# Patient Record
Sex: Male | Born: 1950 | Race: White | Hispanic: No | Marital: Married | Smoking: Former smoker
Health system: Southern US, Community
[De-identification: ages and names within clinical notes are randomized; demographics above are authoritative.]

## PROBLEM LIST (undated history)

## (undated) DIAGNOSIS — I779 Disorder of arteries and arterioles, unspecified: Secondary | ICD-10-CM

## (undated) DIAGNOSIS — Q239 Congenital malformation of aortic and mitral valves, unspecified: Secondary | ICD-10-CM

## (undated) DIAGNOSIS — I200 Unstable angina: Secondary | ICD-10-CM

## (undated) DIAGNOSIS — R001 Bradycardia, unspecified: Secondary | ICD-10-CM

## (undated) DIAGNOSIS — I219 Acute myocardial infarction, unspecified: Secondary | ICD-10-CM

## (undated) DIAGNOSIS — C61 Malignant neoplasm of prostate: Secondary | ICD-10-CM

## (undated) DIAGNOSIS — I82409 Acute embolism and thrombosis of unspecified deep veins of unspecified lower extremity: Secondary | ICD-10-CM

## (undated) DIAGNOSIS — E782 Mixed hyperlipidemia: Secondary | ICD-10-CM

## (undated) DIAGNOSIS — Z789 Other specified health status: Secondary | ICD-10-CM

## (undated) DIAGNOSIS — J45909 Unspecified asthma, uncomplicated: Secondary | ICD-10-CM

## (undated) DIAGNOSIS — I493 Ventricular premature depolarization: Secondary | ICD-10-CM

## (undated) DIAGNOSIS — I2510 Atherosclerotic heart disease of native coronary artery without angina pectoris: Secondary | ICD-10-CM

## (undated) DIAGNOSIS — I739 Peripheral vascular disease, unspecified: Secondary | ICD-10-CM

## (undated) DIAGNOSIS — I10 Essential (primary) hypertension: Secondary | ICD-10-CM

## (undated) HISTORY — PX: HERNIA REPAIR: SHX51

## (undated) HISTORY — DX: Mixed hyperlipidemia: E78.2

## (undated) HISTORY — DX: Peripheral vascular disease, unspecified: I73.9

## (undated) HISTORY — DX: Atherosclerotic heart disease of native coronary artery without angina pectoris: I25.10

## (undated) HISTORY — PX: CARDIAC CATHETERIZATION: SHX172

## (undated) HISTORY — DX: Acute myocardial infarction, unspecified: I21.9

---

## 2005-04-12 ENCOUNTER — Other Ambulatory Visit: Admission: RE | Admit: 2005-04-12 | Discharge: 2005-04-12 | Payer: Self-pay | Admitting: Family Medicine

## 2005-09-26 DIAGNOSIS — C61 Malignant neoplasm of prostate: Secondary | ICD-10-CM

## 2005-09-26 HISTORY — DX: Malignant neoplasm of prostate: C61

## 2005-09-26 HISTORY — PX: PROSTATECTOMY: SHX69

## 2006-01-11 ENCOUNTER — Observation Stay (HOSPITAL_COMMUNITY): Admission: RE | Admit: 2006-01-11 | Discharge: 2006-01-12 | Payer: Self-pay | Admitting: General Surgery

## 2006-01-11 ENCOUNTER — Encounter (INDEPENDENT_AMBULATORY_CARE_PROVIDER_SITE_OTHER): Payer: Self-pay | Admitting: *Deleted

## 2006-02-17 ENCOUNTER — Ambulatory Visit (HOSPITAL_COMMUNITY): Admission: RE | Admit: 2006-02-17 | Discharge: 2006-02-17 | Payer: Self-pay | Admitting: Orthopaedic Surgery

## 2006-04-25 ENCOUNTER — Ambulatory Visit: Admission: RE | Admit: 2006-04-25 | Discharge: 2006-05-17 | Payer: Self-pay | Admitting: Radiation Oncology

## 2006-07-05 ENCOUNTER — Encounter (INDEPENDENT_AMBULATORY_CARE_PROVIDER_SITE_OTHER): Payer: Self-pay | Admitting: *Deleted

## 2006-07-05 ENCOUNTER — Ambulatory Visit (HOSPITAL_COMMUNITY): Admission: RE | Admit: 2006-07-05 | Discharge: 2006-07-06 | Payer: Self-pay | Admitting: Urology

## 2008-05-27 ENCOUNTER — Ambulatory Visit (HOSPITAL_COMMUNITY): Admission: RE | Admit: 2008-05-27 | Discharge: 2008-05-27 | Payer: Self-pay | Admitting: Family Medicine

## 2008-07-01 ENCOUNTER — Ambulatory Visit: Payer: Self-pay | Admitting: Vascular Surgery

## 2009-01-13 ENCOUNTER — Ambulatory Visit: Payer: Self-pay | Admitting: Vascular Surgery

## 2009-07-07 ENCOUNTER — Ambulatory Visit (HOSPITAL_COMMUNITY): Admission: RE | Admit: 2009-07-07 | Discharge: 2009-07-07 | Payer: Self-pay | Admitting: Family Medicine

## 2010-01-07 ENCOUNTER — Ambulatory Visit: Payer: Self-pay | Admitting: Vascular Surgery

## 2011-01-14 ENCOUNTER — Other Ambulatory Visit (INDEPENDENT_AMBULATORY_CARE_PROVIDER_SITE_OTHER): Payer: BC Managed Care – PPO

## 2011-01-14 DIAGNOSIS — I6529 Occlusion and stenosis of unspecified carotid artery: Secondary | ICD-10-CM

## 2011-01-19 NOTE — Procedures (Unsigned)
CAROTID DUPLEX EXAM  INDICATION:  Carotid disease.  HISTORY: Diabetes:  No. Cardiac:  No. Hypertension:  Yes. Smoking:  Previous. Previous Surgery:  No. CV History:  Currently asymptomatic. Amaurosis Fugax No, Paresthesias No, Hemiparesis No                                      RIGHT             LEFT Brachial systolic pressure:         122               118 Brachial Doppler waveforms:         Normal            Normal Vertebral direction of flow:        Antegrade         Antegrade DUPLEX VELOCITIES (cm/sec) CCA peak systolic                   74                M=77/D=170 ECA peak systolic                   102               167 ICA peak systolic                   75                88 ICA end diastolic                   26                33 PLAQUE MORPHOLOGY:                  Heterogeneous     Heterogeneous PLAQUE AMOUNT:                      Mild              Moderate PLAQUE LOCATION:                    ICA               ICA / distal CCA  IMPRESSION: 1. No hemodynamically significant stenosis of the bilateral internal     carotid arteries with plaque formations as described above. 2. Increased velocity noted at the left distal common carotid artery. 3. No significant change noted when compared to the previous exam on     01/07/2010.  ___________________________________________ Di Kindle. Edilia Bo, M.D.  CH/MEDQ  D:  01/17/2011  T:  01/17/2011  Job:  [id number]

## 2011-02-08 NOTE — Consult Note (Signed)
VASCULAR SURGERY CONSULTATION   [name], [name]  DOB:  1951-03-18                                       07/01/2008  CHART#:[id number]   I saw the patient in the office today in consultation concerning a  moderate carotid disease.  Was referred by Dr. Nobie Putnam.  This is a  pleasant 60 year old gentleman who approximately 2 weeks ago began  experiencing headaches at night.  Part of his workup included an MRI of  the head which showed no acute intracranial abnormality.  Dr. Nobie Putnam  also noted a carotid bruit.  This prompted duplex scan which showed some  moderate carotid disease bilaterally.  He was sent for vascular  consultation.  Of note, his headaches have improved significantly.  He  is right handed.  He denies any history of stroke, TIAs, expressive or  receptive aphasia, or amaurosis fugax.  He does have a history of  chronic dizziness.   PAST MEDICAL HISTORY:  Significant for hypertension, prostate cancer,  and has undergone a prostatectomy in October 2007.   FAMILY HISTORY:  His father had coronary artery disease at age 79 and  underwent coronary revascularization.  His grandfather died from a heart  attack at age 36.  He is unaware of any other history of premature  cardiovascular disease.   SOCIAL HISTORY:  He is married.  He does not have any children.  He  works as a Surveyor, minerals.  He quit tobacco in 1979.   REVIEW OF SYSTEMS AND MEDICATIONS:  Documented on the medical history  form in his chart.   PHYSICAL EXAMINATION:  This is a pleasant 60 year old gentleman who  appears his stated age.  Blood pressure is 177/99.  Heart rate is 66.  Neck is supple.  There is no cervical lymphadenopathy.  He has a soft  left carotid bruit.  Lungs are clear bilaterally to auscultation.  Cardiac exam, he has a regular rate and rhythm.  Abdomen:  Soft and  nontender.  I cannot palpate an aneurysm.  He has normal-pitched bowel  sounds.  I do not appreciate any  abdominal bruits.  He has palpable  femoral, popliteal, and pedal pulses bilaterally.  He has no significant  lower extremity swelling.  Neurologic:  Exam is nonfocal.   I did review his duplex scan, which was performed at [facility]  on 05/27/2008.  This shows by velocity criteria a less than 39% right  carotid.  Both vertebral arteries were patent with a 59% left carotid  stenosis.  He has normally directed flow.   I have explained that we generally would not consider carotid  endarterectomy for an asymptomatic patient unless the stenosis  progressed to greater than 80%.  Recommended followup duplex scan in 6  months.  If this is stable, I think he could have followup studies on a  yearly basis.  He apparently had some problems with his insurance  company having a duplex done at [facility], and we would be more than  happy to have this done here, which is what he would prefer unless his  insurance company will cover the studies at [facility].  I will  only plan on seeing him if there is a significant change in his duplex  scan or he develops new neurologic symptoms which we have reviewed the  office today.  He does not continue.   Di Kindle. Edilia Bo, M.D.  Electronically Signed  CSD/MEDQ  D:  07/01/2008  T:  07/02/2008  Job:  [id number]   cc:   Patrica Duel, M.D.

## 2011-02-08 NOTE — Procedures (Signed)
CAROTID DUPLEX EXAM   INDICATION:  Carotid disease.   HISTORY:  Diabetes:  No.  Cardiac:  No.  Hypertension:  Yes.  Smoking:  Previous.  Previous Surgery:  No.  CV History:  Currently asymptomatic.  Amaurosis Fugax No, Paresthesias No, Hemiparesis No.                                       RIGHT             LEFT  Brachial systolic pressure:         128               128  Brachial Doppler waveforms:         Normal            Normal  Vertebral direction of flow:        Antegrade         Antegrade  DUPLEX VELOCITIES (cm/sec)  CCA peak systolic                   82                M = 93/D = 173  ECA peak systolic                   98                167  ICA peak systolic                   91                98  ICA end diastolic                   28                33  PLAQUE MORPHOLOGY:                  Mixed             Mixed  PLAQUE AMOUNT:                      Mild              Mild  PLAQUE LOCATION:                    ICA               ICA/ECA/CCA   IMPRESSION:  1. 1% to 39% stenosis of the bilateral internal carotid arteries.  2. Elevated velocity of 173 cm/s noted near the bifurcation level of      the left common carotid artery.  3. No significant change noted when compared to the previous      examination of 01/13/2009.   ___________________________________________  Di Kindle. Edilia Bo, M.D.   CH/MEDQ  D:  01/07/2010  T:  01/07/2010  Job:  [id number]

## 2011-02-08 NOTE — Procedures (Signed)
CAROTID DUPLEX EXAM   INDICATION:  Carotid disease.   HISTORY:  Diabetes:  No.  Cardiac:  No.  Hypertension:  Yes.  Smoking:  Previous.  Previous Surgery:  No.  CV History:  Asymptomatic.  Amaurosis Fugax No, Paresthesias No, Hemiparesis No.                                       RIGHT             LEFT  Brachial systolic pressure:         130               130  Brachial Doppler waveforms:         Normal            Normal  Vertebral direction of flow:        Antegrade         Antegrade  DUPLEX VELOCITIES (cm/sec)  CCA peak systolic                   98                192/52 (D)  ECA peak systolic                   100               170  ICA peak systolic                   85                92  ICA end diastolic                   26                22  PLAQUE MORPHOLOGY:                  Mixed             Mixed  PLAQUE AMOUNT:                      Mild              Mild/Moderate  PLAQUE LOCATION:                    ICA/ECA           ICA/ECA/Distal CCA   IMPRESSION:  1. 1-39% stenosis of the bilateral internal carotid arteries.  2. Significant stenosis of the left distal common carotid      artery/bifurcation area noted.   ___________________________________________  Di Kindle. Edilia Bo, M.D.   CH/MEDQ  D:  01/13/2009  T:  01/13/2009  Job:  [id number]

## 2011-02-11 NOTE — Op Note (Signed)
NAMEGWENDOLYN, [name] NO.:  [id number]   MEDICAL RECORD NO.:  [id number]          PATIENT TYPE:  AMB   LOCATION:  DAY                           FACILITY:  [facility]   PHYSICIAN:  Barbaraann Barthel, M.D. DATE OF BIRTH:  [birth year]   DATE OF PROCEDURE:  01/11/2006  DATE OF DISCHARGE:                                 OPERATIVE REPORT   SURGEON:  Dr. Malvin Johns.   PREOPERATIVE DIAGNOSIS:  Right inguinal hernia.   POSTOPERATIVE DIAGNOSIS:  Right inguinal hernia (direct).   PROCEDURE:  Right inguinal herniorrhaphy (modified McVay repair).   SPECIMEN:  Lipomatous properitoneal fat.   NOTE:  This is a 60 year old white male who presented with a nonincarcerated  right inguinal hernia.  We discussed elective repair with him in detail.  We  discussed complications not limited to but including bleeding, infection and  recurrence.  Informed consent was obtained.   GROSS OPERATIVE FINDINGS:  Those consistent with a direct hernia defect. He  had some sort of attenuated tissues but sufficient enough to repair without  the use of mesh prosthesis.   TECHNIQUE:  The patient was placed in supine position. After the adequate  administration of general LMA anesthesia, he was prepped with Betadine  solution and draped in the usual manner.  Prior to this, a Foley catheter  was aseptically inserted.  An incision was carried out between the anterior-  superior iliac spine and pubic tubercle through skin and subcutaneous  tissue, down through Scarpa's layer, down through the external oblique which  was opened through the external ring.  The ilioinguinal nerve was preserved  from dissection.  The cord structures were then dissected free.  There was  no indirect hernia sac.  There was rather prominent properitoneal lipomatous  fat which was dissected free from the cord structures, ligated with 2-0 silk  and amputated.  We then closed the inguinal canal with suturing transversus  abdominis and  transversalis fascia to Cooper's ligament and Poupart's  ligament 2-0 Nurolon sutures.  Prior to cinching these tightly, a relaxing  incision was carried out.  We then used 1/2% Sensorcaine to anesthetize the  area for postoperative comfort.  We returned the cord structures and the  ilioinguinal nerve to their anatomic position and closed the external  oblique over this with a running 3-0 Polysorb suture.  The subcu was  irrigated with normal saline solution.  Skin was approximated using a  stapling device.  Prior to closure,  all sponge, needle and instrument counts were found to be correct.  Estimated blood loss was minimal.  The patient received 1100 cc crystalloids  intraoperatively.  No drains were placed, and the patient was taken to  recovery room in satisfactory condition.      Barbaraann Barthel, M.D.  Electronically Signed     WB/MEDQ  D:  01/11/2006  T:  01/12/2006  Job:  [id number]   cc:   Patrica Duel, M.D.  Fax: [phone]

## 2011-02-11 NOTE — Op Note (Signed)
NAMEJARRED, Kyle NO.:  [id number]   MEDICAL RECORD NO.:  [id number]          PATIENT TYPE:  OIB   LOCATION:  1401                         FACILITY:  [facility]   PHYSICIAN:  Valetta Fuller, M.D.  DATE OF BIRTH:  22-Aug-1951   DATE OF PROCEDURE:  DATE OF DISCHARGE:  07/06/2006                                 OPERATIVE REPORT   PREOPERATIVE DIAGNOSIS:  Clinical stage T1c adenocarcinoma prostate.   POSTOPERATIVE DIAGNOSIS:  Clinical stage T1c adenocarcinoma prostate.   PROCEDURE PERFORMED:  Robotic assisted laparoscopic radical retropubic  prostatectomy with bilateral nerve sparing and bilateral pelvic lymph node  dissection.   SURGEON:  Valetta Fuller, M.D.   ASSISTANT:  Heloise Purpura, MD   ANESTHESIA:  General endotracheal.   INDICATIONS:  [name] is a 60 year old male.  He was sent to see me  because of borderline PSA elevation of 4.3.  The patient had also had an MRI  of his pelvis to evaluate some musculoskeletal abnormalities and they  thought there was an area of a prostatic nodule on the left side, but this  area was not palpable.  The patient underwent ultrasound which showed a  moderately enlarged prostate of about 40 grams.  Right-sided biopsies were  negative; on the left he had relatively low volume Gleason 3+4 equals 7  tumor involving 30% of the biopsy material.  The patient underwent extensive  consultation with regard to treatment options.  He had sought a second  opinion as well.  The patient was felt to be a reasonably good candidate for  bilateral nerve sparing robotic prostatectomy.  He elected to proceed that  treatment option.  He was told that he would have approximately a 75% chance  of preserving erectile functioning with the use of oral agents.  We talked  about an 85-90% chance of urinary continence; and a 10-15% chance of some  degree of stress urinary incontinence.  The patient appeared to understand  all the potential  complications and issues with regard to this type surgery;  and full informed consent has been obtained.  The patient has done a  mechanical bowel prep and has received preoperative antibiotics and has  compression stockings.   TECHNIQUE AND FINDINGS:  The patient was brought to the operating room where  he had successful induction of general anesthesia.  He was placed in a  dorsal lithotomy position and secured to the table.  Foley catheter was  inserted sterilely.  Initial site was chosen 18 cm FROM the pubic symphysis  just left the umbilicus.  An open standard Hasson technique was utilized.  The 12-mm port was placed, again, through the open technique; and the  abdomen was insufflated.  The abdomen was carefully inspected.  There was no  evidence of any inner abdominal pathology or significant adhesions.  All the  remaining ports were placed with direct visual guidance.  We placed three  robotic 8-mm ports.  There was also a 12-mm standard laparoscopic assist  port, and a 5-mm port placed for suction.   Attention was then initially turned towards  taking down the bladder.  The  bladder was filled, and then utilizing hot scissors, the bladder was  reflected posteriorly allowing entry into the space of Retzius and  identification of the prostate and endopelvic fascia.  The endopelvic fascia  was incised from the apex back to the base of the prostate bilaterally; and  the underlying levator fibers were swept laterally off the prostate.  The  __________ ligaments were identified and taken down.  An ETS stapler device  was then used to staple the dorsal vein complex down to the urethra.  By  defatting the bladder neck and utilizing the Foley catheter.  The bladder  neck area was easily identified; and the anterior bladder neck was then  divided with electrocautery/hot scissors.  The Foley catheter was  identified; and then the balloon deflated.  The Foley catheter was brought  into the  operative field, and used to retract the prostate anteriorly.  The  posterior bladder neck was then divided.  No evidence of middle lobe was  noted.  The posterior bladder neck dissection was continued until the vas  deferens and seminal vesicles were identified.  These were each dissected  out in their entirety.  Clips were used for the tips of the seminal vesicles  bilaterally.  Space between the fascia and the anterior rectum were then  identified and developed and those planes were felt to be quite normal.   Attention was then turned towards bilateral nerve spare.  The endopelvic  fascia at the lateral junction of the prostate was identified.  The  neurovascular bundles were identified bilaterally and we were able to easily  establish a plane between the prostatic fascia and the neurovascular bundles  which were then swept both distally and proximally back towards the bladder  neck.  With the posterior plane well established and the neurovascular  bundle dissection started we were able to easily identify the areas of the  pedicles of the prostate.  These were taken with Hem-o-lok clips and then  sharply divided.  As the pedicles were taken through we were able to  identify the path of the neurovascular bundles bilaterally, and bilateral  nerve spare was accomplished.  There was no evidence of any injury or  disruption of the prostatic capsule on either side.  Once the prostate was  completely freed, the urethra was divided sharply utilizing cold scissors.  The prostate specimen was then placed up in the abdomen; and the pelvis was  copiously irrigated.  One could see two very nice neurovascular bundles on  both sides.  With irrigation in the pelvis air was injected into the rectal  catheter; and there was no evidence of rectal injury.   Attention was then turned towards bilateral pelvic lymph nodes.  The  external iliac veins were found on both sides.  advential tissue was divided.   The fibrofatty tissue between the externally iliac vein, Cooper's  ligament, obturator nerve, and the confluence of the iliac vessels was  dissected free.  Hem-o-lok clips were used; and both packets were sent  separately for pathologic evaluation.   Attention was then turned towards reconstruction.  The urethral anastomosis  was done initially by placing a 2-0 Vicryl suture between the bladder neck  and the urethra at the 6 o'clock position to reapproximate the posterior  bladder neck and urethra.  A double-armed 2-0 Monocryl suture was then used  to perform a running 360-degree anastomosis between the urethra and the  bladder neck.  A new 18-French Coude catheter was inserted; and the bladder  was irrigated, and found to be watertight.  The surgical cart was then  undocked.  The prostate was placed in an Endopouch and removed through the  camera port site.  A #0 Vicryl suture was placed through the abdominal wall  fascia to close the 12-mm port.  A 19-French Blake drain was also placed  through one of the ports and positioned in the pelvis and secured with a  nylon suture.  All ports were removed with direct vision.  There is no  evidence of any active bleeding.  The camera port incision was closed with  #0 Vicryl suture.  Skin was closed with clips.  The patient was brought to  recovery room in stable condition.  Blood loss was minimal.           ______________________________  Valetta Fuller, M.D.  Electronically Signed     DSG/MEDQ  D:  07/05/2006  T:  07/07/2006  Job:  [id number]   cc:   Patrica Duel, M.D.  Fax: [phone]

## 2011-05-21 ENCOUNTER — Other Ambulatory Visit: Payer: Self-pay

## 2011-05-21 ENCOUNTER — Emergency Department (HOSPITAL_COMMUNITY)
Admission: EM | Admit: 2011-05-21 | Discharge: 2011-05-21 | Disposition: A | Discharge status: Other Acute Inpatient Hospital | Payer: BC Managed Care – PPO | Source: Home / Self Care | Attending: Emergency Medicine | Admitting: Emergency Medicine

## 2011-05-21 ENCOUNTER — Emergency Department (HOSPITAL_COMMUNITY): Payer: BC Managed Care – PPO

## 2011-05-21 ENCOUNTER — Encounter: Payer: Self-pay | Admitting: Emergency Medicine

## 2011-05-21 ENCOUNTER — Inpatient Hospital Stay (HOSPITAL_COMMUNITY)
Admission: EM | Admit: 2011-05-21 | Discharge: 2011-05-25 | DRG: 853 | Disposition: A | Discharge status: Home / Self Care | Payer: BC Managed Care – PPO | Source: Ambulatory Visit | Attending: Internal Medicine | Admitting: Internal Medicine

## 2011-05-21 DIAGNOSIS — I219 Acute myocardial infarction, unspecified: Secondary | ICD-10-CM

## 2011-05-21 DIAGNOSIS — I2109 ST elevation (STEMI) myocardial infarction involving other coronary artery of anterior wall: Principal | ICD-10-CM | POA: Diagnosis present

## 2011-05-21 DIAGNOSIS — E785 Hyperlipidemia, unspecified: Secondary | ICD-10-CM | POA: Diagnosis present

## 2011-05-21 DIAGNOSIS — Z8546 Personal history of malignant neoplasm of prostate: Secondary | ICD-10-CM | POA: Insufficient documentation

## 2011-05-21 DIAGNOSIS — I10 Essential (primary) hypertension: Secondary | ICD-10-CM | POA: Diagnosis present

## 2011-05-21 DIAGNOSIS — I2510 Atherosclerotic heart disease of native coronary artery without angina pectoris: Secondary | ICD-10-CM

## 2011-05-21 DIAGNOSIS — I213 ST elevation (STEMI) myocardial infarction of unspecified site: Secondary | ICD-10-CM

## 2011-05-21 DIAGNOSIS — I2589 Other forms of chronic ischemic heart disease: Secondary | ICD-10-CM | POA: Diagnosis present

## 2011-05-21 HISTORY — DX: Malignant neoplasm of prostate: C61

## 2011-05-21 HISTORY — DX: Essential (primary) hypertension: I10

## 2011-05-21 HISTORY — DX: Peripheral vascular disease, unspecified: I73.9

## 2011-05-21 HISTORY — DX: Disorder of arteries and arterioles, unspecified: I77.9

## 2011-05-21 HISTORY — DX: Acute myocardial infarction, unspecified: I21.9

## 2011-05-21 LAB — CBC
HCT: 47.4 % (ref 39.0–52.0)
Hemoglobin: 16.4 g/dL (ref 13.0–17.0)
MCH: 30.8 pg (ref 26.0–34.0)
MCHC: 34.6 g/dL (ref 30.0–36.0)
MCV: 89.1 fL (ref 78.0–100.0)
Platelets: 225 K/uL (ref 150–400)
RBC: 5.32 MIL/uL (ref 4.22–5.81)
RDW: 12.8 % (ref 11.5–15.5)
WBC: 13.7 10*3/uL — ABNORMAL HIGH (ref 4.0–10.5)

## 2011-05-21 LAB — POCT I-STAT TROPONIN I: Troponin i, poc: 0.01 ng/mL (ref 0.00–0.08)

## 2011-05-21 LAB — URINALYSIS, ROUTINE W REFLEX MICROSCOPIC
Glucose, UA: NEGATIVE mg/dL
Leukocytes, UA: NEGATIVE
Nitrite: NEGATIVE
Specific Gravity, Urine: 1.046 — ABNORMAL HIGH (ref 1.005–1.030)
pH: 7 (ref 5.0–8.0)

## 2011-05-21 LAB — CARDIAC PANEL(CRET KIN+CKTOT+MB+TROPI)
CK, MB: 3.8 ng/mL (ref 0.3–4.0)
Relative Index: 1.8 (ref 0.0–2.5)
Relative Index: 8.5 — ABNORMAL HIGH (ref 0.0–2.5)
Total CK: 211 U/L (ref 7–232)
Total CK: 1055 U/L — ABNORMAL HIGH (ref 7–232)
Troponin I: <0.3 ng/mL (ref <0.30)

## 2011-05-21 LAB — COMPREHENSIVE METABOLIC PANEL
ALT: 31 U/L (ref 0–53)
AST: 84 U/L — ABNORMAL HIGH (ref 0–37)
CO2: 29 mEq/L (ref 19–32)
Calcium: 9 mg/dL (ref 8.4–10.5)
Chloride: 104 mEq/L (ref 96–112)
Creatinine, Ser: 0.77 mg/dL (ref 0.50–1.35)
GFR calc Af Amer: >60 mL/min (ref >60)
GFR calc non Af Amer: >60 mL/min (ref >60)
Glucose, Bld: 97 mg/dL (ref 70–99)
Total Bilirubin: 0.4 mg/dL (ref 0.3–1.2)

## 2011-05-21 LAB — MRSA PCR SCREENING: MRSA by PCR: NEGATIVE

## 2011-05-21 LAB — PROTIME-INR
INR: 0.86 (ref 0.00–1.49)
Prothrombin Time: 11.9 s (ref 11.6–15.2)

## 2011-05-21 LAB — APTT: aPTT: 21 seconds — ABNORMAL LOW (ref 24–37)

## 2011-05-21 MED ORDER — HEPARIN BOLUS VIA INFUSION
4000.0000 [IU] | Freq: Once | INTRAVENOUS | Status: AC
  Administered 2011-05-21: 4000 [IU] via INTRAVENOUS

## 2011-05-21 MED ORDER — FENTANYL CITRATE 0.05 MG/ML IJ SOLN
INTRAMUSCULAR | Status: AC
  Administered 2011-05-21: 100 ug via INTRAVENOUS
  Filled 2011-05-21: qty 2

## 2011-05-21 MED ORDER — ONDANSETRON HCL 4 MG/2ML IJ SOLN
INTRAMUSCULAR | Status: AC
  Administered 2011-05-21: 4 mg via INTRAVENOUS
  Filled 2011-05-21: qty 2

## 2011-05-21 MED ORDER — ONDANSETRON HCL 4 MG/2ML IJ SOLN
4.0000 mg | Freq: Once | INTRAMUSCULAR | Status: AC
  Administered 2011-05-21: 4 mg via INTRAVENOUS

## 2011-05-21 MED ORDER — ONDANSETRON HCL 4 MG/2ML IJ SOLN: 4.0000 mg | Freq: Once | INTRAMUSCULAR | Status: DC

## 2011-05-21 MED ORDER — HEPARIN (PORCINE) IN NACL 100-0.45 UNIT/ML-% IJ SOLN
1000.0000 [IU]/h | Freq: Once | INTRAMUSCULAR | Status: AC
  Administered 2011-05-21: 1000 [IU]/h via INTRAVENOUS

## 2011-05-21 MED ORDER — NITROGLYCERIN IN D5W 200-5 MCG/ML-% IV SOLN
10.0000 ug/min | Freq: Once | INTRAVENOUS | Status: AC
  Administered 2011-05-21: 10 ug/min via INTRAVENOUS

## 2011-05-21 MED ORDER — HEPARIN (PORCINE) IN NACL 100-0.45 UNIT/ML-% IJ SOLN
INTRAMUSCULAR | Status: AC
  Administered 2011-05-21: 1000 [IU]/h via INTRAVENOUS
  Filled 2011-05-21: qty 250

## 2011-05-21 MED ORDER — FENTANYL CITRATE 0.05 MG/ML IJ SOLN
100.0000 ug | Freq: Once | INTRAMUSCULAR | Status: AC
  Administered 2011-05-21: 100 ug via INTRAVENOUS

## 2011-05-21 MED ORDER — NITROGLYCERIN IN D5W 200-5 MCG/ML-% IV SOLN
INTRAVENOUS | Status: AC
  Administered 2011-05-21: 10 ug/min via INTRAVENOUS
  Filled 2011-05-21: qty 250

## 2011-05-21 NOTE — ED Provider Notes (Addendum)
History     CSN: [id number] Arrival date & time: 05/21/2011  5:02 PM  Chief Complaint  Patient presents with  . Chest Pain   HPI Comments: The patient presents by private vehicle to 2 severe onset of mid substernal chest pain radiating straight through to his back approximately 30 minutes prior to arrival. Patient was seen immediately in the emergency department. His initial EKG shows ST elevation in the anterior and lateral leads consistent with ST elevation MI. Patient has severe nausea but has not vomited. Patient reports that he does have a cardiologist with [facility]. He reports he did take 2 aspirin prior to arrival to the emergency department. Denies feeling lightheaded or dizzy. He denies any abdominal pain. No recent fevers coughing or or cold symptoms.  Patient is a 60 y.o. male presenting with chest pain. The history is provided by the patient. The history is limited by the condition of the patient.  Chest Pain     Past Medical History  Diagnosis Date  . Hypertension   . Prostate cancer     Past Surgical History  Procedure Date  . Prostatectomy   . Hernia repair     Family History  Problem Relation Age of Onset  . Cancer Mother   . Heart failure Father     History  Substance Use Topics  . Smoking status: Never Smoker   . Smokeless tobacco: Never Used  . Alcohol Use: No      Review of Systems  Unable to perform ROS: Unstable vital signs  Cardiovascular: Positive for chest pain.    Physical Exam  BP 148/111  Pulse 91  Resp 28  Ht 5\' 8"  (1.727 m)  Wt 175 lb (79.379 kg)  BMI 26.61 kg/m2  SpO2 100%  Physical Exam  Constitutional: He appears well-developed and well-nourished. He appears distressed.  HENT:  Head: Normocephalic.  Eyes: Pupils are equal, round, and reactive to light.  Neck: Neck supple.  Cardiovascular: Normal rate, regular rhythm and normal heart sounds.   Pulmonary/Chest: Tachypnea noted. He is in respiratory  distress. He has no decreased breath sounds. He has no wheezes. He has no rhonchi.  Abdominal: There is no tenderness. There is no rebound.  Musculoskeletal:       No calf swelling or tenderness  Neurological: He is alert. He has normal strength.       No facial droop, moving 4 extremities equally  Skin: Skin is warm. He is diaphoretic.       Mild diaphoresis  Psychiatric: His mood appears anxious.    ED Course  Procedures   Medical decision making: Patient's EKG times at 1704 shows a rate of 89 sinus rhythm with some mild artifact due to the patient's condition and mild agitation. There is evidence of ST elevation in leads V2 through V5 as well as an probably one and aVL. There is normal axis. This is consistent with anterolateral ST elevation MI. Currently the patient is hypertensive protecting his airway. Patient did report taking aspirin prior to arrival to the emergency department. We'll start him on nitroglycerin drip due to his hypertension as well as pain. This can be titrated according to his blood pressure. Patient is also given IV Zofran and fentanyl here immediately for pain. [facility] EMS happens to be severe and emergency department and care will be transferred to them in order to transport emergently to [facility]. I spoke to Dr. Sanjuana Kava, [facility] cardiology.  He accepts the patient to  [facility] emergency department as he currently has a person in the cast room at present. Critical care time of 30 minutes every 2 direct patient care at bedside, serial exams, airway management, pain management, discussion with another physician, documentation    2IV's were placed.  BP remains high in the 150-160 range.  Informed charge nurse at [facility] ED.  EMS told doses of morphine 2 mg IVP could be given provided breathing and BP were appropriate.    5:46 PM PCXR results reviewed.  O2 sats of 100% is normal.  Gavin Pound. Oletta Lamas, MD 05/21/11 1729  Gavin Pound. Oletta Lamas, MD 05/21/11  1739  Gavin Pound. Carlis Blanchard, MD 05/21/11 1746

## 2011-05-21 NOTE — ED Notes (Signed)
Patient's chart faxed to [phone] at [facility] ED at this time.

## 2011-05-21 NOTE — ED Notes (Addendum)
Patient c/o chest pain with shortness of breath. Patient started having severe chest pain while walking in [employer]. Patient very diaphoretic, Short of breath. EDP in room assessing patient.

## 2011-05-21 NOTE — ED Notes (Signed)
Patient left ED via [facility] with [facility]. Patient alert/oriented upon transfer. [facility] called and report given to Christy,RN.

## 2011-05-21 NOTE — ED Notes (Addendum)
Patient c/o substernal chest pain starting 30 minutes ago. Describes pain as sharp/stabbing that radiates to back. Also c/o left arm numbness. Patient diaphoretic, anxious, and dry heaving at present time. Patient with labor breathing and in obvious distress.   Dr. Oletta Lamas at bedside  EKG completed upon arrival to room and shown to Dr. Oletta Lamas.

## 2011-05-22 LAB — CK TOTAL AND CKMB (NOT AT ARMC): CK, MB: 71.8 ng/mL (ref 0.3–4.0)

## 2011-05-22 LAB — BASIC METABOLIC PANEL
CO2: 30 mEq/L (ref 19–32)
Calcium: 9 mg/dL (ref 8.4–10.5)
Creatinine, Ser: 0.81 mg/dL (ref 0.50–1.35)
Glucose, Bld: 103 mg/dL — ABNORMAL HIGH (ref 70–99)

## 2011-05-22 LAB — CARDIAC PANEL(CRET KIN+CKTOT+MB+TROPI)
CK, MB: 103.4 ng/mL (ref 0.3–4.0)
CK, MB: 77.8 ng/mL (ref 0.3–4.0)
Total CK: 821 U/L — ABNORMAL HIGH (ref 7–232)

## 2011-05-22 LAB — LIPID PANEL
Cholesterol: 149 mg/dL (ref 0–200)
HDL: 40 mg/dL (ref >39)
Triglycerides: 90 mg/dL (ref <150)

## 2011-05-22 LAB — CBC
Platelets: 193 10*3/uL (ref 150–400)
RDW: 12.7 % (ref 11.5–15.5)
WBC: 11.1 10*3/uL — ABNORMAL HIGH (ref 4.0–10.5)

## 2011-05-22 LAB — HEMOGLOBIN A1C
Hgb A1c MFr Bld: 5.7 % — ABNORMAL HIGH (ref <5.7)
Mean Plasma Glucose: 117 mg/dL — ABNORMAL HIGH (ref <117)

## 2011-05-23 LAB — CBC
MCV: 88.4 fL (ref 78.0–100.0)
Platelets: 180 10*3/uL (ref 150–400)
RBC: 4.73 MIL/uL (ref 4.22–5.81)
WBC: 13.7 10*3/uL — ABNORMAL HIGH (ref 4.0–10.5)

## 2011-05-23 LAB — CK TOTAL AND CKMB (NOT AT ARMC)
CK, MB: 55.7 ng/mL (ref 0.3–4.0)
Relative Index: 7.6 — ABNORMAL HIGH (ref 0.0–2.5)
Total CK: 737 U/L — ABNORMAL HIGH (ref 7–232)

## 2011-05-23 LAB — BASIC METABOLIC PANEL
Calcium: 9 mg/dL (ref 8.4–10.5)
Chloride: 103 mEq/L (ref 96–112)
Creatinine, Ser: 0.75 mg/dL (ref 0.50–1.35)
GFR calc Af Amer: >60 mL/min (ref >60)
GFR calc non Af Amer: >60 mL/min (ref >60)

## 2011-05-23 LAB — TROPONIN I: Troponin I: >25 ng/mL (ref <0.30)

## 2011-05-23 LAB — POCT ACTIVATED CLOTTING TIME: Activated Clotting Time: 111 seconds

## 2011-05-24 LAB — BASIC METABOLIC PANEL
Calcium: 8.9 mg/dL (ref 8.4–10.5)
GFR calc non Af Amer: >60 mL/min (ref >60)
Sodium: 139 mEq/L (ref 135–145)

## 2011-05-24 LAB — CBC
MCH: 30.6 pg (ref 26.0–34.0)
MCHC: 34.3 g/dL (ref 30.0–36.0)
Platelets: 165 10*3/uL (ref 150–400)
RDW: 12.9 % (ref 11.5–15.5)

## 2011-05-24 NOTE — Cardiovascular Report (Signed)
[name], [name] NO.:  [id number]  MEDICAL RECORD NO.:  [id number]  LOCATION:  2926                         FACILITY:  [facility]  PHYSICIAN:  Verne Carrow, MDDATE OF BIRTH:  Aug 31, 1951  DATE OF PROCEDURE:  05/21/2011 DATE OF DISCHARGE:                           CARDIAC CATHETERIZATION   PRIMARY CARDIOLOGIST:  Thurmon Fair, MD  PROCEDURES PERFORMED: 1. Left heart catheterization. 2. Selective coronary angiography. 3. Left ventricular angiogram. 4. Percutaneous transluminal coronary angioplasty with placement of a     drug-eluting stent in proximal left anterior descending artery. 5. Placement of an Angio-Seal femoral artery closure device in the     right femoral artery.  OPERATOR:  Verne Carrow, MD  INDICATIONS:  This is a 60 year old Caucasian male with a history of hypertension and a strong family history of coronary artery disease who presented via [facility] as a code STEMI.  The patient apparently began having chest pain that was severe earlier this afternoon.  He has been having on and off chest pain for 1 month.  DETAILS OF PROCEDURE:  The patient was brought emergently to the Cardiac Catheterization Laboratory via [facility].  Emergency consent was obtained.  The patient was rapidly moved from the stretcher onto the cath table.  The right groin was prepped and draped in a sterile fashion.  1% lidocaine was used for local anesthesia.  A 6-French sheath was inserted into the right femoral artery without difficulty.  A JR-4 catheter was used to perform selective angiography of the right coronary artery.  An XB LAD 3.5 guiding catheter was used to selectively engage the left main artery.  Selective angiography was performed.  We then performed intervention of the severe stenosis in the proximal left anterior descending artery.  The patient was given a bolus of Angiomax and a drip was started.  The patient was loaded  with 60 mg of Effient by mouth.  A Cougar intracoronary wire was passed down the length of the left anterior descending artery once the ACT was greater than 200.  A 2.5 x 15 mm balloon was used to pre-dilate the lesion.  A 3.0 x 24 mm Promus Element drug-eluting stent was carefully positioned in the proximal vessel and deployed without difficulty.  A 3.5 x 20 mm noncompliant balloon was carefully positioned in the stented segment and deployed without difficulty.  The stenosis was taken from 99% to 0%. There were no immediate complications.  There was excellent flow into the distal vessel.  The patient was taken to the recovery area in stable condition.  HEMODYNAMIC FINDINGS:  Central aortic pressure 109/68.  Left ventricular pressure 105/0/15.  ANGIOGRAPHIC FINDINGS: 1. The left main artery had no disease. 2. The left anterior descending was a large vessel that coursed to the     apex and gave off 5 small-caliber diagonal branches.  Proximal     vessel has a 99% subtotal occlusion.  The midvessel had two areas     of tubular 40% stenosis. 3. The circumflex artery had mild plaque disease. 4. The right coronary artery was a large dominant vessel with mild     plaque disease in the proximal mid vessel.  5. Left ventricular angiogram was performed in the RAO projection and     showed severe left ventricular systolic dysfunction with ejection     fraction of 30%.  The anterior wall had severe hypokinesis.  IMPRESSION: 1. Anterior ST-elevation myocardial infarction. 2. Subtotal occlusion of the proximal left anterior descending artery. 3. Moderate left ventricular systolic dysfunction. 4. Successful percutaneous transluminal coronary angioplasty with     placement of a drug-eluting stent in the proximal left anterior     descending x1.  RECOMMENDATIONS:  The patient should be continued on aspirin and Effient for at least 1 year.  He will also be continued on a beta-blocker  and statin.     Verne Carrow, MD     CM/MEDQ  D:  05/21/2011  T:  05/22/2011  Job:  [id number]  cc:   Thurmon Fair, MD  Electronically Signed by Verne Carrow MD on 05/24/2011 03:16:57 PM

## 2011-06-02 NOTE — Discharge Summary (Signed)
NAMECOYLE, STORDAHL NO.:  [id number]  MEDICAL RECORD NO.:  [id number]  LOCATION:  3711                         FACILITY:  [facility]  PHYSICIAN:  [place] Aedon Deason, MD         DATE OF BIRTH:  25-Jul-1951  DATE OF ADMISSION:  05/21/2011 DATE OF DISCHARGE:  05/25/2011                              DISCHARGE SUMMARY   DISCHARGE DIAGNOSES: 1. ST-elevation myocardial infarction with 99% stenosis of the left     anterior descending artery, undergoing emergent catheterization     with rescue percutaneous transluminal coronary angioplasty and     stent with Promus drug-eluting stent to the left anterior     descending artery. 2. Ischemic cardiomyopathy, ejection fraction 30% with cardiac     catheterization 2 days later and 2-D echo, ejection fraction 35-     40%. 3. Dyslipidemia. 4. Borderline blood pressure with medications. 5. Recurrent chest pain 2 days after original myocardial infarction     with recatheterization and stable stent to the left anterior     descending artery, residual 50% stenosis to the left anterior     descending artery and 40% to the right coronary artery.  DISCHARGE CONDITION:  Improved.  PROCEDURES:  On May 21, 2011, emergent cardiac catheterization for ST- elevation MI by Dr. Verne Carrow, undergoing PTCA and stent deployment to the LAD 99% stenosis to 0%, drug-eluting Promus stent was placed. On May 23, 2011, combined left heart cath with patent stent to the LAD by Dr. Nanetta Batty.  HOSPITAL COURSE:  A 60 year old white male was in [employer] on May 21, 2011 and developed chest pain, took aspirin and went to the emergency room at [facility]  EKG at 1704, ST-elevation MI anterior wall.  The patient received IV fentanyl, 6 mg morphine, nitroglycerin and IV heparin at [facility] and then was transported emergently to [facility].  He had taken his aspirin before arrived to the ER.  He had a history of chest pain off and  on for 1 month prior to this.  PAST MEDICAL HISTORY:  Hypertension with negative nuclear study in 2010. A 2-D echo revealed anterior mitral leaflet prolapse.  He also has a history of prostate cancer with prostatectomy in October 2007, and history of right inguinal hernia repair and mild carotid obstruction.  ALLERGIES:  No known allergies.  The patient was admitted after this stent was placed, he was put on Fastrac and placed in the Step-Down Unit of 2900.  He did fairly well initially.  On May 22, 2011, he had episode of sharp chest pain.  EKG was obtained.  No change.  His Zocor was changed to Lipitor and he had no acute EKG changes.  Later on May 22, 2011, he continue with chest pain.  Morphine was given.  By May 23, 2011, he still had chest pain that was relieved with morphine and nitro. Followup enzymes were done and followup EKG.  Dr. Allyson Sabal felt he should have repeat cardiac catheterization.  He went back to the Cath Lab and results as previously stated.  He continued to stabilize.  By May 24, 2011, he was transferred to telemetry  bed, ambulating with cardiac rehab and by May 25, 2011, he was stable, ambulating without complications and ready for discharge home.  He will follow up with Dr. Allyson Sabal as instructed.  DISCHARGE INSTRUCTIONS: 1. Increase activity slowly.  May shower.  No lifting for 2 weeks.  No     driving for 1 week.  No sexual activity for 2 weeks.  No work until     cleared by Dr. Allyson Sabal. 2. Low-sodium heart-healthy diet. 3. Wash cath site with soap and water.  Call us if any bleeding,     swelling or drainage. 4. Follow up with Dr. Allyson Sabal on June 03, 2011 at 11:15.  DISCHARGE MEDICATIONS:  Please see medication reconciliation sheet.  He was placed on ACE inhibitor, beta-blocker.  He is on Lipitor for now for cholesterol as well as Effient.  The patient will follow up as instructed.  LABORATORY DATA:  Hemoglobin prior to discharge 13.3,  hematocrit 38.8, WBC 9.9, platelets 165.  Protime 11.9, INR 0.86, PTT 21, sodium 139, potassium 3.8, chloride 105, CO2 24, glucose 94, BUN 17, creatinine 0.70.  LFTs were normal except for SGOT was 84, SGPT was 31.  Hemoglobin A1c was 5.7.  Cardiac enzymes; initial CK was 211 with an MB of 3.8 and troponin-I less than 0.30.  Now also n arrival at [facility], follow up at 8 p.m. on May 21, 2011; CK 1055, MB 90, and troponin-I greater than 25.  The peak was 1138, with a peak MB of 103.4, and a peak troponin greater than 25.  On May 23, 2011, the CK was 737, MB 55.7, and troponin remained greater than 25.  Total cholesterol 149, triglycerides 90, HDL 40 and LDL 91, and that was on simvastatin.  TSH 3.224.  UA was clear.  MRSA screening was negative.  Chest x-ray on admission, no acute disease.  A 2-D echo; the ejection fraction was 35- 40% with severe hypokinesis to akinesis of the proximal mid and distal, anterior septal and apical walls consistent with LAD territory ischemia/infarct.  He also grade 1 diastolic dysfunction.  The patient will follow up as instructed.  He was given a work note to stay at work.   Darcella Gasman. Annie Paras, N.P.   ______________________________ [place] Dezi Brauner, MD    LRI/MEDQ  D:  05/25/2011  T:  05/25/2011  Job:  [id number]  cc:   Patrica Duel, M.D. Nanetta Batty, M.D.  Electronically Signed by Nada Boozer N.P. on 05/27/2011 10:35:24 AM Electronically Signed by Kirtland Bouchard. Jatoya Armbrister M.D. on 06/02/2011 08:12:14 AM

## 2011-06-26 NOTE — Cardiovascular Report (Signed)
  [name], FORE NO.:  [id number]  MEDICAL RECORD NO.:  [id number]  LOCATION:  2926                         FACILITY:  [facility]  PHYSICIAN:  Nanetta Batty, M.D.   DATE OF BIRTH:  Aug 02, 1951  DATE OF PROCEDURE:  05/23/2011 DATE OF DISCHARGE:                           CARDIAC CATHETERIZATION   PROCEDURE:  Cardiac catheterization.  SURGEON:  Nanetta Batty, MD  INDICATIONS:  [name] is a 60 year old Caucasian male with a history of hypertension and moderate LV dysfunction who presented with acute anterior ST-segment elevation myocardial infarction on May 21, 2011. He was taken to the cath lab by Dr. Clifton James demonstrating a high-grade proximal LAD lesion which was stented using a drug-eluting stent.  He had minimal CAD otherwise with an anterior wall motion abnormality.  He did well postop, however, he did develop some chest pain and his troponin which initially was decreasing rose again.  Because of this, there was concern about reocclusion of the stent.  There were some evolutionary changes on his EKG as well.  He was seen by Dr. Rennis Golden who recommended diagnostic coronary arteriography to redefine his anatomy and rule out thrombotic event.  DESCRIPTION OF PROCEDURE:  The patient was brought to the second floor [facility] cardiac cath lab in a postabsorptive state.  He was premedicated p.o. Valium.  His right groin was prepped and shaved in the usual sterile fashion.  Xylocaine 1% was used for local anesthesia.  A 5- French sheath was inserted into the right femoral artery using the standard Seldinger technique.  A 5-French right-left Judkins diagnostic catheters were used for selective coronary angiography, left ventriculography was not performed.  Visipaque dye was used for entirety of the case.  Retrograde aortic pressures were monitored during the case.  HEMODYNAMIC RESULTS:  Aortic systolic pressure 87, diastolic pressure 57.  SELECTIVE  CORONARY ANGIOGRAPHY: 1. Left main normal and short. 2. LAD; the proximal LAD stent was widely patent.  There was a 50%     segmental stenosis just beyond the LAD stent, unchanged from the     prior cath. 3. Left circumflex; nondominant and free of significant disease. 4. Right coronary artery; dominant with 40% stenosis segmentally     distally.  IMPRESSION:  [name]' proximal left anterior descending stents are widely patent.  Because of his chest pain and rise in troponins is unclear, he has got TIMI 3 flow down all vessels.  Continue medical therapy will be recommended.  The ACT was measured and the sheath was removed.  Pressure was held on the groin to achieve hemostasis.  The patient left the lab in stable condition.  Continued medical therapy will be recommended.     Nanetta Batty, M.D.     Cordelia Pen  D:  05/23/2011  T:  05/23/2011  Job:  [id number]  cc:   Second Floor [facility] Thurmon Fair, MD [facility]  Electronically Signed by Nanetta Batty M.D. on 06/26/2011 11:42:17 AM

## 2012-01-18 ENCOUNTER — Other Ambulatory Visit: Payer: BC Managed Care – PPO

## 2012-01-18 ENCOUNTER — Ambulatory Visit: Payer: BC Managed Care – PPO

## 2012-06-13 ENCOUNTER — Ambulatory Visit (HOSPITAL_COMMUNITY)
Admission: RE | Admit: 2012-06-13 | Discharge: 2012-06-13 | Disposition: A | Discharge status: Home / Self Care | Payer: BC Managed Care – PPO | Source: Ambulatory Visit | Attending: Cardiovascular Disease | Admitting: Cardiovascular Disease

## 2012-06-13 DIAGNOSIS — I252 Old myocardial infarction: Secondary | ICD-10-CM | POA: Insufficient documentation

## 2012-06-13 LAB — COMPREHENSIVE METABOLIC PANEL
ALT: 27 U/L (ref 0–53)
Albumin: 4.3 g/dL (ref 3.5–5.2)
Alkaline Phosphatase: 80 U/L (ref 39–117)
Calcium: 9.4 mg/dL (ref 8.4–10.5)
Potassium: 4.7 mEq/L (ref 3.5–5.1)
Sodium: 140 mEq/L (ref 135–145)
Total Protein: 7 g/dL (ref 6.0–8.3)

## 2012-06-13 LAB — LIPID PANEL
Cholesterol: 151 mg/dL (ref 0–200)
HDL: 41 mg/dL (ref >39)
Total CHOL/HDL Ratio: 3.7 RATIO
VLDL: 18 mg/dL (ref 0–40)

## 2012-06-13 LAB — PLATELET INHIBITION P2Y12: Platelet Function  P2Y12: 182 [PRU] — ABNORMAL LOW (ref 194–418)

## 2013-02-02 ENCOUNTER — Encounter: Payer: Self-pay | Admitting: Cardiovascular Disease

## 2013-02-14 ENCOUNTER — Telehealth: Payer: Self-pay | Admitting: Cardiovascular Disease

## 2013-02-14 ENCOUNTER — Ambulatory Visit: Payer: BC Managed Care – PPO | Admitting: Cardiovascular Disease

## 2013-02-14 NOTE — Telephone Encounter (Signed)
Called pt to r/s appointment due to Dr. Allyson Sabal not being in the office. Called both numbers had to leave 2 messages I will be cancelling his appointment.

## 2013-02-15 ENCOUNTER — Ambulatory Visit: Payer: BC Managed Care – PPO | Admitting: Cardiovascular Disease

## 2013-03-20 ENCOUNTER — Encounter: Payer: Self-pay | Admitting: Cardiovascular Disease

## 2013-03-20 ENCOUNTER — Ambulatory Visit (INDEPENDENT_AMBULATORY_CARE_PROVIDER_SITE_OTHER): Payer: BC Managed Care – PPO | Admitting: Cardiovascular Disease

## 2013-03-20 VITALS — BP 120/80 | HR 60 | Resp 12 | Ht 68.0 in | Wt 180.4 lb

## 2013-03-20 DIAGNOSIS — I2510 Atherosclerotic heart disease of native coronary artery without angina pectoris: Secondary | ICD-10-CM | POA: Insufficient documentation

## 2013-03-20 DIAGNOSIS — E782 Mixed hyperlipidemia: Secondary | ICD-10-CM

## 2013-03-20 DIAGNOSIS — I10 Essential (primary) hypertension: Secondary | ICD-10-CM | POA: Insufficient documentation

## 2013-03-20 DIAGNOSIS — Z79899 Other long term (current) drug therapy: Secondary | ICD-10-CM

## 2013-03-20 NOTE — Progress Notes (Signed)
Patient ID: [name], male   DOB: [birth year], 62 y.o.   MRN: [id number]     Reason for office visit Followup CAD  It has been almost 2 years since [name] suffered an acute central ST segment elevation myocardial infarction but he had rapid revascularization and at this point there is no evidence of myocardial sequelae. His dose of pravastatin was increased last summer as his LDL cholesterol was still high at 126. The most recent lipid profile shows that it is now less than 100.  He works in [employer] and sometimes engages in intense physical activity without any complaints of angina dyspnea on exertion. He has not had any bleeding problems while on clopidogrel, although he had a lot of bleeding and bruising when he took Effient.  No Known Allergies  Current Outpatient Prescriptions  Medication Sig Dispense Refill  . amLODipine (NORVASC) 5 MG tablet Take 5 mg by mouth daily.      Marland Kitchen aspirin 81 MG tablet Take 81 mg by mouth daily. Take two tablets daily      . clopidogrel (PLAVIX) 75 MG tablet Take 75 mg by mouth daily.      . Coenzyme Q10 (CO Q-10) 200 MG CAPS Take 1 capsule by mouth daily.      Marland Kitchen losartan (COZAAR) 100 MG tablet Take 100 mg by mouth daily.      . metoprolol tartrate (LOPRESSOR) 25 MG tablet Take 25 mg by mouth 2 (two) times daily.      . nitroGLYCERIN (NITROSTAT) 0.4 MG SL tablet Place 0.4 mg under the tongue every 5 (five) minutes as needed for chest pain.      . pravastatin (PRAVACHOL) 40 MG tablet Take 40 mg by mouth daily.       No current facility-administered medications for this visit.    Past Medical History  Diagnosis Date  . Hypertension   . Prostate cancer 2007  . Carotid artery disease     carotid dopplers 05/27/2008, showed moderate left ICA disease, and disease in left ECA   . Hyperlipidemia, mixed   . Peripheral arterial disease     moderate left ICA disease, mild right ICA disease  . Coronary artery disease   . Myocardial infarction  05/21/2011    anterior wall, treated by [name] with 3.0x5mm Promus drug eluting stent to proximal LAD     Past Surgical History  Procedure Laterality Date  . Prostatectomy  2007  . Hernia repair    . Cardiac catheterization  05/23/2011, 05/21/2011    05/21/2011   3.0 x 24mm Promus Element drug eluting stent positioned in proximal LAD, stenosis taken from 99% to 0%.    Family History  Problem Relation Age of Onset  . Heart failure [name]     CABG at age 42  . Leukemia [name]     History   Social History  . Marital Status: Married    Spouse Name: N/A    Number of Children: 0  . Years of Education: N/A   Occupational History  . Not on file.   Social History Main Topics  . Smoking status: Former Smoker    Quit date: 09/26/1968  . Smokeless tobacco: Never Used  . Alcohol Use: No  . Drug Use: No  . Sexually Active: Not on file   Other Topics Concern  . Not on file   Social History Narrative  . No narrative on file    Review of systems: The patient specifically denies  any chest pain at rest or with exertion, dyspnea at rest or with exertion, orthopnea, paroxysmal nocturnal dyspnea, syncope, palpitations, focal neurological deficits, intermittent claudication, lower extremity edema, unexplained weight gain, cough, hemoptysis or wheezing.  The patient also denies abdominal pain, nausea, vomiting, dysphagia, diarrhea, constipation, polyuria, polydipsia, dysuria, hematuria, frequency, urgency, abnormal bleeding or bruising, fever, chills, unexpected weight changes, mood swings, change in skin or hair texture, change in voice quality, auditory or visual problems, allergic reactions or rashes, new musculoskeletal complaints other than usual "aches and pains".   PHYSICAL EXAM BP 120/80  Pulse 60  Resp 12  Ht 5\' 8"  (1.727 m)  Wt 180 lb 6.4 oz (81.829 kg)  BMI 27.44 kg/m2  General: Alert, oriented x3, no distress Head: no evidence of trauma, PERRL, EOMI, no  exophtalmos or lid lag, no myxedema, no xanthelasma; normal ears, nose and oropharynx Neck: normal jugular venous pulsations and no hepatojugular reflux; brisk carotid pulses without delay and no carotid bruits Chest: clear to auscultation, no signs of consolidation by percussion or palpation, normal fremitus, symmetrical and full respiratory excursions Cardiovascular: normal position and quality of the apical impulse, regular rhythm, normal first and second heart sounds, no murmurs, rubs or gallops Abdomen: no tenderness or distention, no masses by palpation, no abnormal pulsatility or arterial bruits, normal bowel sounds, no hepatosplenomegaly Extremities: no clubbing, cyanosis or edema; 2+ radial, ulnar and brachial pulses bilaterally; 2+ right femoral, posterior tibial and dorsalis pedis pulses; 2+ left femoral, posterior tibial and dorsalis pedis pulses; no subclavian or femoral bruits Neurological: grossly nonfocal   EKG: Normal sinus rhythm, normal tracing  Lipid Panel     Component Value Date/Time   CHOL 151 06/13/2012 0955   TRIG 88 06/13/2012 0955   HDL 41 06/13/2012 0955   CHOLHDL 3.7 06/13/2012 0955   VLDL 18 06/13/2012 0955   LDLCALC 92 06/13/2012 0955    BMET    Component Value Date/Time   NA 140 06/13/2012 0955   K 4.7 06/13/2012 0955   CL 103 06/13/2012 0955   CO2 29 06/13/2012 0955   GLUCOSE 103* 06/13/2012 0955   BUN 14 06/13/2012 0955   CREATININE 0.85 06/13/2012 0955   CALCIUM 9.4 06/13/2012 0955   GFRNONAA >90 06/13/2012 0955   GFRAA >90 06/13/2012 0955     ASSESSMENT AND PLAN CAD (coronary artery disease) He is currently asymptomatic despite engaging in relatively intense physical activity. There is no evidence of active coronary insufficiency. There is no evidence of sequelae of his anterior wall infarction by either EKG or echo or nuclear perfusion imaging. The focus should be on coronary risk factor modification. The outstanding problem remains low HDL cholesterol and  elevated triglycerides. See discussion below  HTN (hypertension) Adequate control.   Mixed hyperlipidemia He has persistently low HDL cholesterol despite the fact that his LDL cholesterol is now fairly well controlled. Will check an NMR lipid profile to assess particle number and LDL particle size. Consider adding fibrates if the LDL particle numbers still high   Orders Placed This Encounter  Procedures  . NMR Lipoprofile with Lipids  . Comp Met (CMET)  . EKG 12-Lead   Meds ordered this encounter  Medications  . Coenzyme Q10 (CO Q-10) 200 MG CAPS    Sig: Take 1 capsule by mouth daily.    Junious Silk, MD, [facility] [phone] office [phone] pager

## 2013-03-20 NOTE — Assessment & Plan Note (Addendum)
He is currently asymptomatic despite engaging in relatively intense physical activity. There is no evidence of active coronary insufficiency. There is no evidence of sequelae of his anterior wall infarction by either EKG or echo or nuclear perfusion imaging. The focus should be on coronary risk factor modification. The outstanding problem remains low HDL cholesterol and elevated triglycerides. See discussion below. At this point the absolute benefit of clopidogrel is small. It is not causing any bleeding problems we will continue at, but the threshold for its discontinuation is fairly low.

## 2013-03-20 NOTE — Patient Instructions (Addendum)
Your physician recommends that you have your blood work done at your earliest convenience.  Your physician recommends that you schedule a follow-up appointment in: 1 year(June 2015)

## 2013-03-20 NOTE — Assessment & Plan Note (Signed)
He has persistently low HDL cholesterol despite the fact that his LDL cholesterol is now fairly well controlled. Will check an NMR lipid profile to assess particle number and LDL particle size. Consider adding fibrates if the LDL particle numbers still high

## 2013-03-20 NOTE — Assessment & Plan Note (Signed)
Adequate control. 

## 2013-03-21 ENCOUNTER — Other Ambulatory Visit: Payer: Self-pay | Admitting: *Deleted

## 2013-03-21 MED ORDER — PRAVASTATIN SODIUM 40 MG PO TABS: 40.0000 mg | ORAL_TABLET | Freq: Every day | ORAL | Status: DC

## 2013-03-21 MED ORDER — METOPROLOL TARTRATE 25 MG PO TABS: 25.0000 mg | ORAL_TABLET | Freq: Two times a day (BID) | ORAL | Status: DC

## 2013-03-21 MED ORDER — NITROGLYCERIN 0.4 MG SL SUBL: 0.4000 mg | SUBLINGUAL_TABLET | SUBLINGUAL | Status: DC | PRN

## 2013-03-21 MED ORDER — LOSARTAN POTASSIUM 100 MG PO TABS: 100.0000 mg | ORAL_TABLET | Freq: Every day | ORAL | Status: DC

## 2013-03-21 MED ORDER — CLOPIDOGREL BISULFATE 75 MG PO TABS: 75.0000 mg | ORAL_TABLET | Freq: Every day | ORAL | Status: DC

## 2013-03-21 MED ORDER — AMLODIPINE BESYLATE 5 MG PO TABS: 5.0000 mg | ORAL_TABLET | Freq: Every day | ORAL | Status: DC

## 2013-03-21 NOTE — Telephone Encounter (Signed)
All meds refilled #90 w/3 refills [facility]

## 2013-03-22 LAB — NMR LIPOPROFILE WITH LIPIDS
Cholesterol, Total: 152 mg/dL (ref <200)
HDL Particle Number: 29.7 umol/L — ABNORMAL LOW (ref >=30.5)
HDL Size: 8.3 nm — ABNORMAL LOW (ref >=9.2)
Large HDL-P: 2 umol/L — ABNORMAL LOW (ref >=4.8)
Large VLDL-P: 1.4 nmol/L (ref <=2.7)
Triglycerides: 83 mg/dL (ref <150)

## 2013-03-22 LAB — COMPREHENSIVE METABOLIC PANEL
Albumin: 4.2 g/dL (ref 3.5–5.2)
BUN: 12 mg/dL (ref 6–23)
CO2: 27 mEq/L (ref 19–32)
Calcium: 9.1 mg/dL (ref 8.4–10.5)
Chloride: 107 mEq/L (ref 96–112)
Glucose, Bld: 96 mg/dL (ref 70–99)
Potassium: 4.5 mEq/L (ref 3.5–5.3)

## 2013-03-25 ENCOUNTER — Telehealth (HOSPITAL_COMMUNITY): Payer: Self-pay | Admitting: Cardiovascular Disease

## 2013-03-25 NOTE — Telephone Encounter (Signed)
Patient states that he was seen here by Dr. Salena Saner on 03/20/13 and was told that his medications would be called to [facility] in [place], [place].  He received all of his meds today from [facility] in [place], [place].   Please advise.

## 2013-03-26 ENCOUNTER — Encounter: Payer: Self-pay | Admitting: *Deleted

## 2013-03-26 ENCOUNTER — Telehealth: Payer: Self-pay | Admitting: *Deleted

## 2013-03-26 MED ORDER — PRAVASTATIN SODIUM 40 MG PO TABS: 40.0000 mg | ORAL_TABLET | Freq: Every day | ORAL | Status: DC

## 2013-03-26 MED ORDER — LOSARTAN POTASSIUM 100 MG PO TABS: 100.0000 mg | ORAL_TABLET | Freq: Every day | ORAL | Status: DC

## 2013-03-26 MED ORDER — AMLODIPINE BESYLATE 5 MG PO TABS: 5.0000 mg | ORAL_TABLET | Freq: Every day | ORAL | Status: DC

## 2013-03-26 MED ORDER — CLOPIDOGREL BISULFATE 75 MG PO TABS: 75.0000 mg | ORAL_TABLET | Freq: Every day | ORAL | Status: DC

## 2013-03-26 MED ORDER — METOPROLOL TARTRATE 25 MG PO TABS: 25.0000 mg | ORAL_TABLET | Freq: Two times a day (BID) | ORAL | Status: DC

## 2013-03-26 MED ORDER — NITROGLYCERIN 0.4 MG SL SUBL: 0.4000 mg | SUBLINGUAL_TABLET | SUBLINGUAL | Status: DC | PRN

## 2013-03-26 NOTE — Telephone Encounter (Signed)
Error

## 2013-03-26 NOTE — Telephone Encounter (Signed)
Returned call.  Pt stated he received a bunch of medication in the mail and "they" were supposed to send them to [facility].  RN apologized for the mix-up and informed Rxs can be sent to [facility] for him now.  Pt stated he would like that b/c the meds he got in the mail are not sealed and he doesn't know what to do with them.  Stated he received them w/ a bill also.  Asked pt if he called [facility] and pt stated he hadn't.  Pt stated he didn't think he should have to call them b/c he didn't order them through them.  Pt informed Dr. Erin Hearing CMA will be notified to contact him regarding this.  Pt verbalized understanding and agreed w/ plan.  Refill(s) sent to [facility] pharmacy.  Forwarded to B. Lassiter, CMA.

## 2013-04-01 NOTE — Telephone Encounter (Signed)
[name] is calling about his medication that he has not received as of yet . [facility] is stating that they have not received the refills as of Saturday .Marland KitchenPlease call   Thanks

## 2013-04-01 NOTE — Telephone Encounter (Signed)
Call to pharmacy.  Informed Rxs were received.  Returned call to pt.  Pt stated he just left the pharmacy and they told him they did receive the prescriptions.  Stated he called [facility] and they told him they will NOT take the medicine back or give him a credit.  Pt stated he will NOT take the medicine b/c they are not in a tamperproof bottles and the package itself was not tamperproof.  Pt informed RN will notify administration to find out what needs to happen at this point.  Pt verbalized understanding and agreed w/ plan.  Pt aware that RN may not call him back until tomorrow.

## 2013-04-01 NOTE — Telephone Encounter (Signed)
Kyle H., Office Manager, notified and will investigate.  Call to pt and informed.  Verbalized understanding and agreed w/ plan.  Pt will get name of med he will run out of and inform when call returned tomorrow.

## 2013-04-02 NOTE — Telephone Encounter (Signed)
Demetrios Isaacs., Office Manager, stated Kennon Rounds, pharmacist, will contact pt.

## 2013-04-02 NOTE — Telephone Encounter (Signed)
Spoke with [employer].  There is no override option available to cover his prescriptions from the local pharmacy since he already received the mail order prescriptions.  He will be eligible to refill his prescriptions on his insurance 68 days from 03/21/13.  He currently has enough of everything to last through the end of the week except for his metoprolol.  He states he does not need any NTG.  [facility] pharmacy.  Cash prices for 30-day supply of each medication are as follows:   Pravastatin- $33.91 Metoprolol tartrate- $4 Losartan- 16.38 Plavix- $18.81 Amlodipine- $4  Discussed with Wyline Copas, Chiropodist.  Will plan on practice paying patient for 2 months of his prescriptions until his insurance will cover meds (approx. 05/28/13).  Total cost should be $154.20.  Pt aware will send him a check but will take several weeks.    He did have a question for Dr. Salena Saner regarding plavix.  He was under the impression that he did not need to take it any longer.  Per Dr. Renaye Rakers note, pt to continue unless having bleeding problems.  Pt does not report any problems.  Will send to Dr. Salena Saner for clarification.

## 2013-04-03 NOTE — Telephone Encounter (Signed)
Call to pt r/t Plavix. Pt stated Dr. Salena Saner told him he could stop it.  Pt informed per last OV note that he is to continue Plavix.  Pt still has some at home and agreed to take it until Dr. Salena Saner returns on next week and clarifies directions.

## 2013-04-09 NOTE — Telephone Encounter (Signed)
[name] can stop the Plavix.

## 2013-04-11 NOTE — Telephone Encounter (Signed)
Patient notified he can stop plavix per Dr. Salena Saner.  Pt voiced understanding.

## 2013-04-11 NOTE — Telephone Encounter (Signed)
Pt notified he can stop Plavix per Dr. Salena Saner.

## 2013-04-22 IMAGING — CR DG CHEST 1V PORT
1 series · 1 of 1 positions shown · non-contrast
Comparison: PA and lateral chest 07/04/2006.

CLINICAL DATA: Severe chest pain.

PORTABLE CHEST - 1 VIEW

[view not recorded]
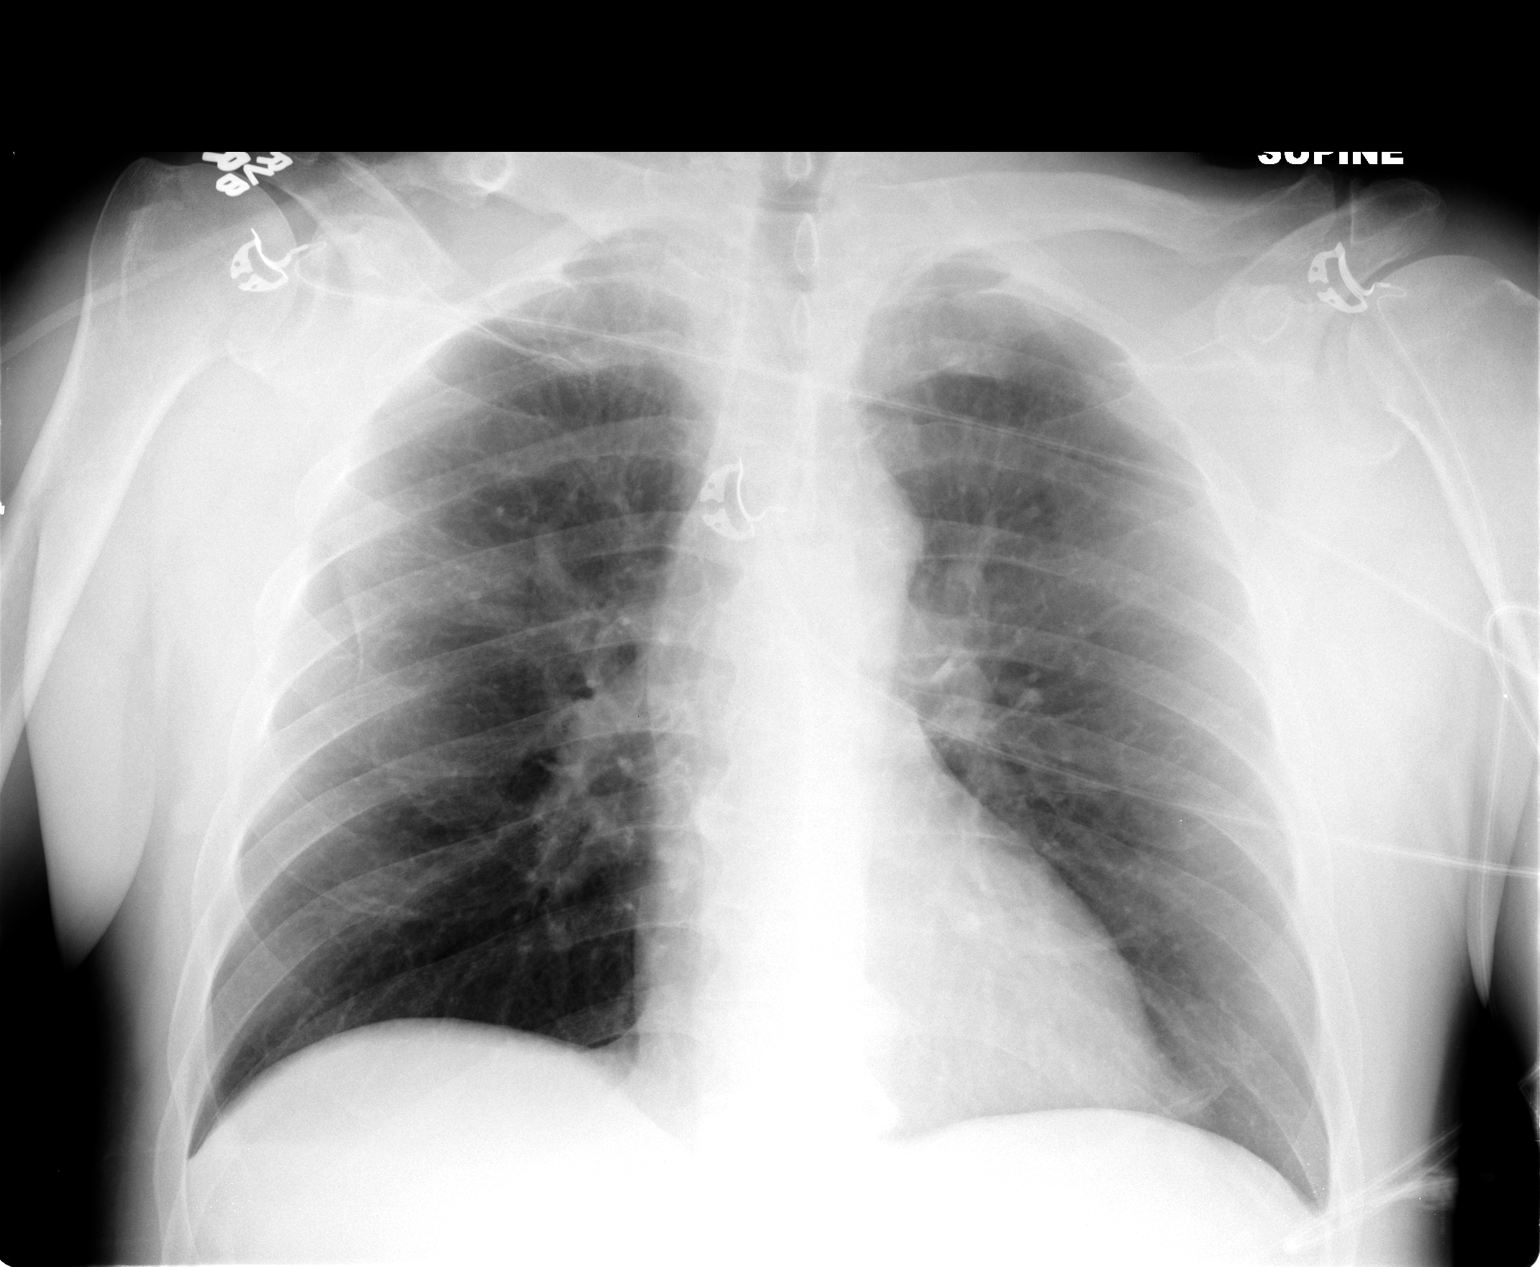

[1 of 1 positions shown; findings below may reference images not displayed]

FINDINGS: Lungs are clear.  Heart size is normal.  No pneumothorax
or pleural effusion.
IMPRESSION: No acute disease.

## 2013-08-10 ENCOUNTER — Other Ambulatory Visit: Payer: Self-pay | Admitting: Cardiovascular Disease

## 2013-08-14 ENCOUNTER — Other Ambulatory Visit: Payer: Self-pay | Admitting: Cardiovascular Disease

## 2013-08-14 NOTE — Telephone Encounter (Signed)
Rx was sent to pharmacy electronically. 

## 2013-10-05 ENCOUNTER — Other Ambulatory Visit: Payer: Self-pay | Admitting: Cardiovascular Disease

## 2013-10-07 NOTE — Telephone Encounter (Signed)
Rx was sent to pharmacy electronically. 

## 2014-03-01 ENCOUNTER — Other Ambulatory Visit: Payer: Self-pay | Admitting: Cardiovascular Disease

## 2014-03-03 NOTE — Telephone Encounter (Signed)
Rx was sent to pharmacy electronically. 

## 2014-04-24 ENCOUNTER — Encounter: Payer: Self-pay | Admitting: Cardiology

## 2014-04-24 ENCOUNTER — Telehealth: Payer: Self-pay | Admitting: *Deleted

## 2014-04-24 ENCOUNTER — Ambulatory Visit (INDEPENDENT_AMBULATORY_CARE_PROVIDER_SITE_OTHER): Payer: Commercial Indemnity | Admitting: Cardiology

## 2014-04-24 VITALS — BP 102/72 | HR 60 | Ht 68.0 in | Wt 181.0 lb

## 2014-04-24 DIAGNOSIS — I2510 Atherosclerotic heart disease of native coronary artery without angina pectoris: Secondary | ICD-10-CM

## 2014-04-24 DIAGNOSIS — I10 Essential (primary) hypertension: Secondary | ICD-10-CM

## 2014-04-24 DIAGNOSIS — R42 Dizziness and giddiness: Secondary | ICD-10-CM

## 2014-04-24 DIAGNOSIS — R079 Chest pain, unspecified: Secondary | ICD-10-CM

## 2014-04-24 DIAGNOSIS — R0989 Other specified symptoms and signs involving the circulatory and respiratory systems: Secondary | ICD-10-CM

## 2014-04-24 MED ORDER — METOPROLOL TARTRATE 25 MG PO TABS: 25.0000 mg | ORAL_TABLET | Freq: Two times a day (BID) | ORAL | Status: DC

## 2014-04-24 MED ORDER — LOSARTAN POTASSIUM 50 MG PO TABS: 50.0000 mg | ORAL_TABLET | Freq: Every day | ORAL | Status: DC

## 2014-04-24 MED ORDER — PANTOPRAZOLE SODIUM 40 MG PO TBEC: 40.0000 mg | DELAYED_RELEASE_TABLET | Freq: Every day | ORAL | Status: DC

## 2014-04-24 MED ORDER — NITROGLYCERIN 0.4 MG SL SUBL: 0.4000 mg | SUBLINGUAL_TABLET | SUBLINGUAL | Status: DC | PRN

## 2014-04-24 MED ORDER — AMLODIPINE BESYLATE 5 MG PO TABS: 5.0000 mg | ORAL_TABLET | Freq: Every day | ORAL | Status: DC

## 2014-04-24 NOTE — Telephone Encounter (Signed)
Just FYI, Pt insurance will not cover stress test and carotid ultrasound. He will call back to schedule these test in march when he gets medicare/tmj

## 2014-04-24 NOTE — Progress Notes (Signed)
Clinical Summary [name] is a 63 y.o.male last seen by Dr Sallyanne Kuster, this is our first visit together. He is seen for the following medical problems.   1. CAD - hx of prior anterior STEMI 04/2011, s/p DES to LAD. Repeat cath 04/2011 with patent stent and overall patent vessels.  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 Pressure in right chest, 4/10. Can occur at rest or exertion. +SOB. + palpitations. Nothing makes worst, not positional. Lasts just a few minutes. Occurs approx once a week. Has not tried any NG. Has been intermittent over years, but increased frequency over the last 3 months.  - notes some increased DOE over same time period.    2. Dizziness - gets dizzy when looking up  - can occur occasoinally with standing up  3. HTN - does not check regularly at home - compliant with meds  4. Hyperlipidemia - intolerant to statins, has been tried on multiple.    Past Medical History  Diagnosis Date  . Hypertension   . Prostate cancer 2007  . Carotid artery disease     carotid dopplers 05/27/2008, showed moderate left ICA disease, and disease in left ECA   . Hyperlipidemia, mixed   . Peripheral arterial disease     moderate left ICA disease, mild right ICA disease  . Coronary artery disease   . Myocardial infarction 05/21/2011    anterior wall, treated by Dr. [name] with 3.0x71mm Promus drug eluting stent to proximal LAD      No Known Allergies   Current Outpatient Prescriptions  Medication Sig Dispense Refill  . amLODipine (NORVASC) 5 MG tablet Take 1 tablet (5 mg total) by mouth daily.  90 tablet  3  . aspirin 81 MG tablet Take 81 mg by mouth daily. Take two tablets daily      . clopidogrel (PLAVIX) 75 MG tablet Take 1 tablet (75 mg total) by mouth daily.  90 tablet  3  . Coenzyme Q10 (CO Q-10) 200 MG CAPS Take 1 capsule by mouth daily.      Marland Kitchen losartan (COZAAR) 100 MG tablet Take 1 tablet (100 mg  total) by mouth daily. <please make appointment for future refills>  90 tablet  0  . metoprolol tartrate (LOPRESSOR) 25 MG tablet Take 1 tablet (25 mg total) by mouth 2 (two) times daily. <please make appointment for future refills>  180 tablet  0  . nitroGLYCERIN (NITROSTAT) 0.4 MG SL tablet Place 1 tablet (0.4 mg total) under the tongue every 5 (five) minutes as needed for chest pain.  25 tablet  3  . pravastatin (PRAVACHOL) 40 MG tablet TAKE ONE TABLET DAILY.  30 tablet  5   No current facility-administered medications for this visit.     Past Surgical History  Procedure Laterality Date  . Prostatectomy  2007  . Hernia repair    . Cardiac catheterization  05/23/2011, 05/21/2011    05/21/2011   3.0 x 55mm Promus Element drug eluting stent positioned in proximal LAD, stenosis taken from 99% to 0%.     No Known Allergies    Family History  Problem Relation Age of Onset  . Heart failure Father     CABG at age 77  . Leukemia Mother      Social History [name] reports that he quit smoking about 45 years ago. He has never used smokeless tobacco. [name] reports that he  does not drink alcohol.   Review of Systems CONSTITUTIONAL: No weight loss, fever, chills, weakness or fatigue.  HEENT: Eyes: No visual loss, blurred vision, double vision or yellow sclerae.No hearing loss, sneezing, congestion, runny nose or sore throat.  SKIN: No rash or itching.  CARDIOVASCULAR: per HPI RESPIRATORY: No shortness of breath, cough or sputum.  GASTROINTESTINAL: No anorexia, nausea, vomiting or diarrhea. No abdominal pain or blood.  GENITOURINARY: No burning on urination, no polyuria NEUROLOGICAL: per HPI MUSCULOSKELETAL: No muscle, back pain, joint pain or stiffness.  LYMPHATICS: No enlarged nodes. No history of splenectomy.  PSYCHIATRIC: No history of depression or anxiety.  ENDOCRINOLOGIC: No reports of sweating, cold or heat intolerance. No polyuria or polydipsia.  Marland Kitchen   Physical  Examination p 60 bp 102/70 Wt 181 lbs BMI 28 Gen: resting comfortably, no acute distress HEENT: no scleral icterus, pupils equal round and reactive, no palptable cervical adenopathy,  CV: RRR, no m/r/g, no JVD. +bilatreal carotid bruit Resp: Clear to auscultation bilaterally GI: abdomen is soft, non-tender, non-distended, normal bowel sounds, no hepatosplenomegaly MSK: extremities are warm, no edema.  Skin: warm, no rash Neuro:  no focal deficits Psych: appropriate affect   Diagnostic Studies 04/2011 Cath HEMODYNAMIC FINDINGS: Central aortic pressure 109/68. Left ventricular  pressure 105/0/15.  ANGIOGRAPHIC FINDINGS:  1. The left main artery had no disease.  2. The left anterior descending was a large vessel that coursed to the  apex and gave off 5 small-caliber diagonal branches. Proximal  vessel has a 99% subtotal occlusion. The midvessel had two areas  of tubular 40% stenosis.  3. The circumflex artery had mild plaque disease.  4. The right coronary artery was a large dominant vessel with mild  plaque disease in the proximal mid vessel.  5. Left ventricular angiogram was performed in the RAO projection and  showed severe left ventricular systolic dysfunction with ejection  fraction of 30%. The anterior wall had severe hypokinesis.  IMPRESSION:  1. Anterior ST-elevation myocardial infarction.  2. Subtotal occlusion of the proximal left anterior descending artery.  3. Moderate left ventricular systolic dysfunction.  4. Successful percutaneous transluminal coronary angioplasty with  placement of a drug-eluting stent in the proximal left anterior  descending x1.    04/24/14 EKG NSR, low voltage  Assessment and Plan  1. CAD - describes episodes of chest pain - will check exercise nuclear stress test  2. Dizziness - only occurs when looking up, could be some form of positional vertigo. His blood pressure is borderline low, will decrease losartan to 50mg  daily and follow  symptoms  3. HTN - borderline low, decrease losartan  4. Hyperlipidemia - intolerant to staitns  5. Carotid bruits - repeat carotid US   F/u 6 weeks.    Arnoldo Lenis, M.D., F.A.C.C.

## 2014-04-24 NOTE — Telephone Encounter (Signed)
Will route to Dr. Branch 

## 2014-04-24 NOTE — Patient Instructions (Addendum)
Your physician recommends that you schedule a follow-up appointment in: to be determined after testing    Your physician has requested that you have a carotid duplex. This test is an ultrasound of the carotid arteries in your neck. It looks at blood flow through these arteries that supply the brain with blood. Allow one hour for this exam. There are no restrictions or special instructions.     Your physician has requested that you have en exercise stress myoview. For further information please visit [email or web address]. Please follow instruction sheet, as given.  PLEASE HOLD METOPROLOL THE MORNING OF THE TEST   Your physician has recommended you make the following change in your medication:    DECREASE Losartan  to 50 mg daily   Please call the BILLING DEPT AT [phone] to assist with your questions regarding cost of tests  Thank you for choosing [facility] !

## 2014-04-29 NOTE — Telephone Encounter (Signed)
Can we look into why his insurance would not cover test. He has known CAD with recurrent chest pain.   Zandra Abts MD

## 2014-04-29 NOTE — Telephone Encounter (Signed)
Will have front desk Kerin Ransom investigate with our pre-auth dept

## 2014-05-02 ENCOUNTER — Encounter (HOSPITAL_COMMUNITY): Payer: Commercial Indemnity

## 2014-05-02 ENCOUNTER — Other Ambulatory Visit (HOSPITAL_COMMUNITY): Payer: Commercial Indemnity

## 2014-10-02 ENCOUNTER — Encounter: Payer: Commercial Indemnity | Admitting: Adult Health

## 2014-10-02 NOTE — Progress Notes (Signed)
    Error NO show  

## 2014-10-08 ENCOUNTER — Ambulatory Visit (INDEPENDENT_AMBULATORY_CARE_PROVIDER_SITE_OTHER): Payer: 59 | Admitting: Physician Assistant

## 2014-10-08 ENCOUNTER — Encounter: Payer: Self-pay | Admitting: *Deleted

## 2014-10-08 ENCOUNTER — Encounter: Payer: Self-pay | Admitting: Physician Assistant

## 2014-10-08 VITALS — BP 126/86 | HR 62 | Ht 68.0 in | Wt 177.0 lb

## 2014-10-08 DIAGNOSIS — R0989 Other specified symptoms and signs involving the circulatory and respiratory systems: Secondary | ICD-10-CM

## 2014-10-08 DIAGNOSIS — R079 Chest pain, unspecified: Secondary | ICD-10-CM

## 2014-10-08 DIAGNOSIS — I209 Angina pectoris, unspecified: Secondary | ICD-10-CM | POA: Insufficient documentation

## 2014-10-08 DIAGNOSIS — R002 Palpitations: Secondary | ICD-10-CM

## 2014-10-08 DIAGNOSIS — I10 Essential (primary) hypertension: Secondary | ICD-10-CM

## 2014-10-08 NOTE — Assessment & Plan Note (Signed)
Patient continues to have chest pain at rest described as a tightness which shortness of breath and pain in his right neck and right arm. Pain eases with aspirin. It usually occurs after his evening meal when he sits down. He does have history of coronary artery disease and stress Myoview was recommended act in July. Patient will have this done in early February once Medicare begins. He has not tried nitroglycerin and I asked him to use this. I've also asked him to try filling his Protonix prescription to see if this helps. Proceed to the emergency room for prolonged pain. 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

## 2014-10-08 NOTE — Progress Notes (Signed)
HPI: This is a 64 year old male patient of Dr. Harl Bowie who has history of coronary artery disease status post STEMI in 04/2011 treated with drug-eluting stent to the LAD. Repeat cath in 04/2011 showed patent stent and overall patent vessels. 2-D echo in 2012 EF greater than 55% with mild MR and probably bicuspid aortic valve. Patient last saw Dr. Harl Bowie in July 2015 at which time he was having mild chest discomfort at rest or with exertion associated with shortness of breath and palpitations. Patient also has history of carotid disease. Exercise nuclear stress test and carotid Dopplers were both ordered. Patient did not have this done because his deductible was $3500 and he will be on Medicare beginning February 1.  Patient comes in today with his main complaint of heart skipping and pauses as well as blood pressure fluctuation. He takes his blood pressure twice a day. It ranges from 110/50 to 155/90 and pulse ranges from 40-85 on his blood pressure machine. He says every evening when he lays down he feels his heart skipping every 3 beats. He says then there is a pause before it beats regular again. He says this has been going on since November. He drinks 2 cups of coffee in the morning, soda the afternoon and Tea in the evening. He also continues to have chest tightness and pressure associated with shortness of breath as well as right arm and neck pain. This usually occurs at the end of the day after he eats his evening meal. It eases with aspirin. He's tried some heartburn medications but doesn't seem to help. 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 His medicine list has Protonix on it but he doesn't think he is taking it and  the pharmacy says it hasn't been filled since August.    No Known Allergies   Current Outpatient Prescriptions  Medication Sig Dispense Refill  . amLODipine (NORVASC) 5  MG tablet Take 1 tablet (5 mg total) by mouth daily. 30 tablet 6  . aspirin 81 MG tablet Take 81 mg by mouth daily. Take two tablets daily    . Coenzyme Q10 (CO Q-10) 200 MG CAPS Take 1 capsule by mouth daily.    Marland Kitchen losartan (COZAAR) 50 MG tablet Take 1 tablet (50 mg total) by mouth daily. <please make appointment for future refills> 30 tablet 6  . metoprolol tartrate (LOPRESSOR) 25 MG tablet Take 1 tablet (25 mg total) by mouth 2 (two) times daily. <please make appointment for future refills> 60 tablet 6  . nitroGLYCERIN (NITROSTAT) 0.4 MG SL tablet Place 1 tablet (0.4 mg total) under the tongue every 5 (five) minutes as needed for chest pain. 25 tablet 3  . pantoprazole (PROTONIX) 40 MG tablet Take 1 tablet (40 mg total) by mouth daily. 30 tablet 6   No current facility-administered medications for this visit.    Past Medical History  Diagnosis Date  . Hypertension   . Prostate cancer 2007  . Carotid artery disease     carotid dopplers 05/27/2008, showed moderate left ICA disease, and disease in left ECA   . Hyperlipidemia, mixed   . Peripheral arterial disease     moderate left ICA disease, mild right ICA disease  . Coronary artery disease   . Myocardial infarction 05/21/2011    anterior wall, treated by Dr. [name] with 3.0x32mm Promus drug eluting stent to  proximal LAD     Past Surgical History  Procedure Laterality Date  . Prostatectomy  2007  . Hernia repair    . Cardiac catheterization  05/23/2011, 05/21/2011    05/21/2011   3.0 x 64mm Promus Element drug eluting stent positioned in proximal LAD, stenosis taken from 99% to 0%.    Family History  Problem Relation Age of Onset  . Heart failure Father     CABG at age 45  . Leukemia Mother     History   Social History  . Marital Status: Married    Spouse Name: N/A    Number of Children: 0  . Years of Education: N/A   Occupational History  . Not on file.   Social History Main Topics  . Smoking status:  Former Smoker    Quit date: 09/26/1968  . Smokeless tobacco: Never Used  . Alcohol Use: No  . Drug Use: No  . Sexual Activity: Not on file   Other Topics Concern  . Not on file   Social History Narrative  . No narrative on file    ROS: See history of present illness otherwise negative  BP 126/86 mmHg  Pulse 62  Ht 5\' 8"  (1.727 m)  Wt 177 lb (80.287 kg)  BMI 26.92 kg/m2  PHYSICAL EXAM: Well-nournished, in no acute distress. Neck: Bilateral carotid bruits No JVD, HJR, or thyroid enlargement  Lungs: No tachypnea, clear without wheezing, rales, or rhonchi  Cardiovascular: RRR, PMI not displaced, Normal S1 and S2, no murmurs, gallops, bruit, thrill, or heave.  Abdomen: BS normal. Soft without organomegaly, masses, lesions or tenderness.  Extremities: without cyanosis, clubbing or edema. Good distal pulses bilateral  SKin: Warm, no lesions or rashes   Musculoskeletal: No deformities  Neuro: no focal signs   Wt Readings from Last 3 Encounters:  04/24/14 181 lb (82.101 kg)  03/20/13 180 lb 6.4 oz (81.829 kg)  05/21/11 175 lb (79.379 kg)    Lab Results  Component Value Date   WBC 9.9 05/24/2011   HGB 13.3 05/24/2011   HCT 38.8* 05/24/2011   PLT 165 05/24/2011   GLUCOSE 96 03/22/2013   CHOL 151 06/13/2012   TRIG 83 03/22/2013   HDL 41 06/13/2012   LDLCALC 96 03/22/2013   ALT 16 03/22/2013   AST 16 03/22/2013   NA 141 03/22/2013   K 4.5 03/22/2013   CL 107 03/22/2013   CREATININE 0.84 03/22/2013   BUN 12 03/22/2013   CO2 27 03/22/2013   TSH 3.224 05/21/2011   INR 0.86 05/21/2011   HGBA1C 5.7* 05/22/2011    EKG: Sinus bradycardia 53 bpm, no acute change

## 2014-10-08 NOTE — Assessment & Plan Note (Signed)
Patient's blood pressures been fluctuating up and down. His losartan was decreased from 100 mg to 50 mg last office visit because of low blood pressure. I gave the patient a 2 g sodium diet. He can take an extra losartan for elevated blood pressure.

## 2014-10-08 NOTE — Patient Instructions (Addendum)
Your physician recommends that you schedule a follow-up appointment after stress test.   Your physician recommends that you continue on your current medications as directed. Please refer to the Current Medication list given to you today.  Your physician has requested that you have a carotid duplex. This test is an ultrasound of the carotid arteries in your neck. It looks at blood flow through these arteries that supply the brain with blood. Allow one hour for this exam. There are no restrictions or special instructions.   Your physician has requested that you have en exercise stress myoview. For further information please visit [email or web address]. Please follow instruction sheet, as given.  Your physician has recommended that you wear a holter monitor. Holter monitors are medical devices that record the heart's electrical activity. Doctors most often use these monitors to diagnose arrhythmias. Arrhythmias are problems with the speed or rhythm of the heartbeat. The monitor is a small, portable device. You can wear one while you do your normal daily activities. This is usually used to diagnose what is causing palpitations/syncope (passing out).  Your physician recommends that you return for lab work in Fasting (CMET, CBC, TSH)  You may take and extra Losartan for elevated blood pressure.  Your Provider recommends that you take your Nitroglycerin and Protonix as directed.   You have been given a copy of the Low Sodium Diet

## 2014-10-08 NOTE — Assessment & Plan Note (Signed)
Patient will schedule carotid Dopplers in February.

## 2014-10-08 NOTE — Assessment & Plan Note (Addendum)
Palpitations are new problem for this patient. They occur mostly in the evening when he is resting. Recommend blood work including TSH as well as a 48 hour monitor. Decrease caffeine intake. Patient will not do this until he has Medicare.

## 2014-10-10 NOTE — Addendum Note (Signed)
Addended by: Levonne Hubert on: 10/10/2014 09:46 AM   Modules accepted: Orders

## 2014-11-03 ENCOUNTER — Telehealth: Payer: Self-pay

## 2014-11-03 LAB — CBC WITH DIFFERENTIAL/PLATELET
BASOS ABS: 0.1 10*3/uL (ref 0.0–0.1)
BASOS PCT: 1 % (ref 0–1)
Eosinophils Absolute: 0.2 10*3/uL (ref 0.0–0.7)
Eosinophils Relative: 3 % (ref 0–5)
HCT: 46.1 % (ref 39.0–52.0)
Hemoglobin: 15.7 g/dL (ref 13.0–17.0)
LYMPHS PCT: 30 % (ref 12–46)
Lymphs Abs: 1.9 10*3/uL (ref 0.7–4.0)
MCH: 30.7 pg (ref 26.0–34.0)
MCHC: 34.1 g/dL (ref 30.0–36.0)
MCV: 90.2 fL (ref 78.0–100.0)
MPV: 10.3 fL (ref 8.6–12.4)
Monocytes Absolute: 0.7 10*3/uL (ref 0.1–1.0)
Monocytes Relative: 11 % (ref 3–12)
Neutro Abs: 3.4 10*3/uL (ref 1.7–7.7)
Neutrophils Relative %: 55 % (ref 43–77)
Platelets: 187 10*3/uL (ref 150–400)
RBC: 5.11 MIL/uL (ref 4.22–5.81)
RDW: 13.4 % (ref 11.5–15.5)
WBC: 6.2 10*3/uL (ref 4.0–10.5)

## 2014-11-03 LAB — COMPREHENSIVE METABOLIC PANEL
ALK PHOS: 62 U/L (ref 39–117)
ALT: 20 U/L (ref 0–53)
AST: 17 U/L (ref 0–37)
Albumin: 4.1 g/dL (ref 3.5–5.2)
BUN: 17 mg/dL (ref 6–23)
CO2: 29 mEq/L (ref 19–32)
CREATININE: 0.88 mg/dL (ref 0.50–1.35)
Calcium: 9.3 mg/dL (ref 8.4–10.5)
Chloride: 105 mEq/L (ref 96–112)
Glucose, Bld: 102 mg/dL — ABNORMAL HIGH (ref 70–99)
Potassium: 4.2 mEq/L (ref 3.5–5.3)
Sodium: 141 mEq/L (ref 135–145)
Total Bilirubin: 0.5 mg/dL (ref 0.2–1.2)
Total Protein: 6.2 g/dL (ref 6.0–8.3)

## 2014-11-03 LAB — TSH: TSH: 3.114 u[IU]/mL (ref 0.350–4.500)

## 2014-11-03 NOTE — Telephone Encounter (Signed)
-----   Message from Imogene Burn, PA-C sent at 11/03/2014  3:02 PM EST ----- Labs normal

## 2014-11-03 NOTE — Telephone Encounter (Signed)
pt aware of lab results.Labs normal. pt verbalized understanding.

## 2014-11-05 ENCOUNTER — Ambulatory Visit (INDEPENDENT_AMBULATORY_CARE_PROVIDER_SITE_OTHER)
Admission: RE | Admit: 2014-11-05 | Discharge: 2014-11-05 | Disposition: A | Discharge status: Home / Self Care | Payer: Medicare HMO | Source: Ambulatory Visit | Attending: Physician Assistant | Admitting: Physician Assistant

## 2014-11-05 ENCOUNTER — Encounter (HOSPITAL_COMMUNITY)
Admission: RE | Admit: 2014-11-05 | Discharge: 2014-11-05 | Disposition: A | Discharge status: Home / Self Care | Payer: Medicare HMO | Source: Ambulatory Visit | Attending: Cardiology | Admitting: Cardiology

## 2014-11-05 ENCOUNTER — Ambulatory Visit (HOSPITAL_COMMUNITY)
Admission: RE | Admit: 2014-11-05 | Discharge: 2014-11-05 | Disposition: A | Discharge status: Home / Self Care | Payer: Medicare HMO | Source: Ambulatory Visit | Attending: Cardiology | Admitting: Cardiology

## 2014-11-05 ENCOUNTER — Encounter (HOSPITAL_COMMUNITY): Payer: Self-pay

## 2014-11-05 DIAGNOSIS — R079 Chest pain, unspecified: Secondary | ICD-10-CM

## 2014-11-05 DIAGNOSIS — R0989 Other specified symptoms and signs involving the circulatory and respiratory systems: Secondary | ICD-10-CM

## 2014-11-05 DIAGNOSIS — I493 Ventricular premature depolarization: Secondary | ICD-10-CM | POA: Diagnosis not present

## 2014-11-05 DIAGNOSIS — I2510 Atherosclerotic heart disease of native coronary artery without angina pectoris: Secondary | ICD-10-CM | POA: Insufficient documentation

## 2014-11-05 MED ORDER — TECHNETIUM TC 99M SESTAMIBI - CARDIOLITE
30.0000 | Freq: Once | INTRAVENOUS | Status: AC | PRN
Start: 2014-11-05 — End: 2014-11-05
  Administered 2014-11-05: 30 via INTRAVENOUS

## 2014-11-05 MED ORDER — SODIUM CHLORIDE 0.9 % IJ SOLN
10.0000 mL | INTRAMUSCULAR | Status: DC | PRN
  Administered 2014-11-05: 10 mL via INTRAVENOUS
  Filled 2014-11-05: qty 10

## 2014-11-05 MED ORDER — TECHNETIUM TC 99M SESTAMIBI GENERIC - CARDIOLITE
10.0000 | Freq: Once | INTRAVENOUS | Status: AC | PRN
  Administered 2014-11-05: 10 via INTRAVENOUS

## 2014-11-05 MED ORDER — SODIUM CHLORIDE 0.9 % IJ SOLN
INTRAMUSCULAR | Status: AC
  Administered 2014-11-05: 10 mL via INTRAVENOUS
  Filled 2014-11-05: qty 3

## 2014-11-05 MED ORDER — REGADENOSON 0.4 MG/5ML IV SOLN
INTRAVENOUS | Status: AC
  Filled 2014-11-05: qty 5

## 2014-11-05 NOTE — Progress Notes (Signed)
48 hour Holter Monitor in progress. 

## 2014-11-05 NOTE — Progress Notes (Signed)
Stress Lab Nurses Notes - [name]  [name] 11/05/2014 Reason for doing test: CAD, Chest Pain and Dyspnea Type of test: Stress Cardiolite Nurse performing test: Gerrit Halls, RN Nuclear Medicine Tech: Redmond Baseman Echo Tech: Not Applicable MD performing test: S. McDowell/M.Bonnell Public PA Family MD: Fusco Test explained and consent signed: Yes.   IV started: Saline lock flushed, No redness or edema and Saline lock started in radiology Symptoms: Fatigue Treatment/Intervention: None Reason test stopped: fatigue After recovery IV was: Discontinued via X-ray tech and No redness or edema Patient to return to Nuc. Med at :12:15 Patient discharged: Home Patient's Condition upon discharge was: stable Comments: During test peak BP 169/71 & HR 118.  Recovery BP 162/87 & HR 71.  Symptoms resolved in recovery. Geanie Cooley T

## 2014-11-08 ENCOUNTER — Other Ambulatory Visit: Payer: Self-pay | Admitting: Cardiology

## 2014-11-19 ENCOUNTER — Encounter: Payer: Self-pay | Admitting: Cardiology

## 2014-11-19 ENCOUNTER — Ambulatory Visit (INDEPENDENT_AMBULATORY_CARE_PROVIDER_SITE_OTHER): Payer: Medicare HMO | Admitting: Cardiology

## 2014-11-19 VITALS — BP 110/88 | HR 81 | Ht 68.0 in | Wt 178.0 lb

## 2014-11-19 DIAGNOSIS — R0989 Other specified symptoms and signs involving the circulatory and respiratory systems: Secondary | ICD-10-CM

## 2014-11-19 DIAGNOSIS — E782 Mixed hyperlipidemia: Secondary | ICD-10-CM

## 2014-11-19 DIAGNOSIS — R42 Dizziness and giddiness: Secondary | ICD-10-CM

## 2014-11-19 DIAGNOSIS — I10 Essential (primary) hypertension: Secondary | ICD-10-CM

## 2014-11-19 DIAGNOSIS — I2510 Atherosclerotic heart disease of native coronary artery without angina pectoris: Secondary | ICD-10-CM

## 2014-11-19 DIAGNOSIS — R002 Palpitations: Secondary | ICD-10-CM

## 2014-11-19 MED ORDER — METOPROLOL TARTRATE 25 MG PO TABS: ORAL_TABLET | ORAL | Status: DC

## 2014-11-19 NOTE — Patient Instructions (Signed)
Your physician wants you to follow-up in: 6 months with Dr Bryna Colander will receive a reminder letter in the mail two months in advance. If you don't receive a letter, please call our office to schedule the follow-up appointment.   INCREASE Lopressor to 50 mg in the am, and 25 mg at night   Keep BP log and take to pcp at next visit      Thank you for choosing [facility] !

## 2014-11-19 NOTE — Progress Notes (Signed)
Clinical Summary [name] is a 64 y.o.male seen today for follow up of the following medical problems.    1. CAD - hx of prior anterior STEMI 04/2011, s/p DES to LAD. Repeat cath 04/2011 with patent stent and overall patent vessels.  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 Documented only 77% of THR, however discussed with technicians and they state he reached target heart rate.  - since last visit chest pain symptoms much improved.  2. Dizziness - gets dizzy when looking up  - can occur occasoinally with standing up - symptoms improved after we recently decreased his losartan to 50mg  daily.   3. HTN - checks twice daily at home. Typically 110s-150s/70-80s.  4. Hyperlipidemia - intolerant to statins, has been tried on multiple.   5. Palpitations - 48 hr montior showed NSR with frequent PVCs.TSH 3.114,  - coffee x 2 cups in AM, iced tea at night. Occas sodas. No EtoH.   Past Medical History  Diagnosis Date  . Hypertension   . Prostate cancer 2007  . Carotid artery disease     carotid dopplers 05/27/2008, showed moderate left ICA disease, and disease in left ECA   . Hyperlipidemia, mixed   . Peripheral arterial disease     moderate left ICA disease, mild right ICA disease  . Coronary artery disease   . Myocardial infarction 05/21/2011    anterior wall, treated by Dr. [name] with 3.0x88mm Promus drug eluting stent to proximal LAD      No Known Allergies   Current Outpatient Prescriptions  Medication Sig Dispense Refill  . amLODipine (NORVASC) 5 MG tablet TAKE ONE TABLET BY MOUTH ONCE DAILY. 30 tablet 6  . aspirin 81 MG tablet Take 81 mg by mouth daily. Take two tablets daily    . Coenzyme Q10 (CO Q-10) 200 MG CAPS Take 1 capsule by mouth daily.    Marland Kitchen losartan (COZAAR) 50 MG tablet Take 1 tablet (50 mg total) by mouth daily. <please make appointment for future  refills> 30 tablet 6  . metoprolol tartrate (LOPRESSOR) 25 MG tablet Take 1 tablet (25 mg total) by mouth 2 (two) times daily. <please make appointment for future refills> 60 tablet 6  . nitroGLYCERIN (NITROSTAT) 0.4 MG SL tablet Place 1 tablet (0.4 mg total) under the tongue every 5 (five) minutes as needed for chest pain. 25 tablet 3  . pantoprazole (PROTONIX) 40 MG tablet Take 1 tablet (40 mg total) by mouth daily. 30 tablet 6   No current facility-administered medications for this visit.     Past Surgical History  Procedure Laterality Date  . Prostatectomy  2007  . Hernia repair    . Cardiac catheterization  05/23/2011, 05/21/2011    05/21/2011   3.0 x 37mm Promus Element drug eluting stent positioned in proximal LAD, stenosis taken from 99% to 0%.     No Known Allergies    Family History  Problem Relation Age of Onset  . Heart failure Father     CABG at age 55  . Leukemia Mother      Social History [name] reports that he quit smoking about 46 years ago. He has never used smokeless tobacco. [name] reports that he does not drink alcohol.   Review of Systems CONSTITUTIONAL: No weight loss, fever, chills, weakness or fatigue.  HEENT: Eyes: No visual loss, blurred vision,  double vision or yellow sclerae.No hearing loss, sneezing, congestion, runny nose or sore throat.  SKIN: No rash or itching.  CARDIOVASCULAR: per HPI RESPIRATORY: No shortness of breath, cough or sputum.  GASTROINTESTINAL: No anorexia, nausea, vomiting or diarrhea. No abdominal pain or blood.  GENITOURINARY: No burning on urination, no polyuria NEUROLOGICAL: occas dizziness MUSCULOSKELETAL: No muscle, back pain, joint pain or stiffness.  LYMPHATICS: No enlarged nodes. No history of splenectomy.  PSYCHIATRIC: No history of depression or anxiety.  ENDOCRINOLOGIC: No reports of sweating, cold or heat intolerance. No polyuria or polydipsia.  Marland Kitchen   Physical Examination p 81 bp 110/88 Wt 178 lbs BMI  27 Gen: resting comfortably, no acute distress HEENT: no scleral icterus, pupils equal round and reactive, no palptable cervical adenopathy,  CV: RRR, no m/r/g, no JVD, no carotid bruits Resp: Clear to auscultation bilaterally GI: abdomen is soft, non-tender, non-distended, normal bowel sounds, no hepatosplenomegaly MSK: extremities are warm, no edema.  Skin: warm, no rash Neuro:  no focal deficits Psych: appropriate affect   Diagnostic Studies 04/2011 Cath HEMODYNAMIC FINDINGS: Central aortic pressure 109/68. Left ventricular  pressure 105/0/15.  ANGIOGRAPHIC FINDINGS:  1. The left main artery had no disease.  2. The left anterior descending was a large vessel that coursed to the  apex and gave off 5 small-caliber diagonal branches. Proximal  vessel has a 99% subtotal occlusion. The midvessel had two areas  of tubular 40% stenosis.  3. The circumflex artery had mild plaque disease.  4. The right coronary artery was a large dominant vessel with mild  plaque disease in the proximal mid vessel.  5. Left ventricular angiogram was performed in the RAO projection and  showed severe left ventricular systolic dysfunction with ejection  fraction of 30%. The anterior wall had severe hypokinesis.  IMPRESSION:  1. Anterior ST-elevation myocardial infarction.  2. Subtotal occlusion of the proximal left anterior descending artery.  3. Moderate left ventricular systolic dysfunction.  4. Successful percutaneous transluminal coronary angioplasty with  placement of a drug-eluting stent in the proximal left anterior  descending x1.      10/2014 Exercise MPI IMPRESSION: 1. No reversible ischemia or infarction at the level of stress achieved, patient did not reach 85% of target heart rate.  2. Normal left ventricular wall motion.  3. Left ventricular ejection fraction 52%  4. Low-risk stress test findings*. Please note test sensitivity is decreased, patient did not  reach target heart rate.   10/2014 Carotid US  IMPRESSION: 1. Interval reduction in left-sided atherosclerotic plaque burden, though residual plaque results in persistently borderline elevated peak systolic velocities within the left internal carotid artery compatible with the lower end of the 50-69% luminal narrowing range. Further evaluation could be performed with CTA as clinically indicated. 2. Interval reduction in right-sided atherosclerotic plaque burden, with residual plaque not resulting in a hemodynamically significant stenosis.     Assessment and Plan  1. CAD - nuclear stress negative for ischemia, though per documentation only reached 77% of THR though in talking to technician she feels he did reach 85% - symptoms improved since last visit, will continue to follow. Currently on lopressor and norvasc as antianginals.   2. Dizziness - improved since decreasing losaran. Symptoms were not purely orthostatic as they could sometimes be brought on simply by looking up. Will continue to monitor.  3. HTN - at goal based on clinic numbers, overall home numbers look good though some rare systolics in the 671I - follow bp on increased dose of  metoprolol. He will keep bp log  4. Hyperlipidemia - intolerant to staitns, conintue dietary modification  5. Carotid stenosis - recent US 10/2014 with moderate disease on the left, mild on the right - continue to monitor  6. Palpitations - monitor shows frequent PVCs, will increase lopressor to 50mg  in AM and 25mg  in PM.  -   F/u 6 months   Arnoldo Lenis, M.D.

## 2015-02-17 ENCOUNTER — Other Ambulatory Visit: Payer: Self-pay | Admitting: Cardiology

## 2015-05-22 ENCOUNTER — Encounter: Payer: Self-pay | Admitting: Cardiology

## 2015-05-22 ENCOUNTER — Ambulatory Visit (INDEPENDENT_AMBULATORY_CARE_PROVIDER_SITE_OTHER): Payer: Medicare HMO | Admitting: Cardiology

## 2015-05-22 VITALS — BP 130/78 | HR 55 | Ht 68.0 in | Wt 180.0 lb

## 2015-05-22 DIAGNOSIS — R002 Palpitations: Secondary | ICD-10-CM

## 2015-05-22 DIAGNOSIS — I2510 Atherosclerotic heart disease of native coronary artery without angina pectoris: Secondary | ICD-10-CM | POA: Diagnosis not present

## 2015-05-22 DIAGNOSIS — R7309 Other abnormal glucose: Secondary | ICD-10-CM

## 2015-05-22 DIAGNOSIS — E785 Hyperlipidemia, unspecified: Secondary | ICD-10-CM

## 2015-05-22 MED ORDER — AMLODIPINE BESYLATE 5 MG PO TABS: 5.0000 mg | ORAL_TABLET | Freq: Every day | ORAL | Status: DC

## 2015-05-22 MED ORDER — LOSARTAN POTASSIUM 25 MG PO TABS: 25.0000 mg | ORAL_TABLET | Freq: Every day | ORAL | Status: DC

## 2015-05-22 MED ORDER — METOPROLOL TARTRATE 25 MG PO TABS: 25.0000 mg | ORAL_TABLET | Freq: Two times a day (BID) | ORAL | Status: DC

## 2015-05-22 MED ORDER — PRAVASTATIN SODIUM 40 MG PO TABS: 40.0000 mg | ORAL_TABLET | Freq: Every day | ORAL | Status: DC

## 2015-05-22 NOTE — Progress Notes (Signed)
Patient ID: [name], male   DOB: [birth year], 64 y.o.   MRN: [id number]     Clinical Summary [name] is a 64 y.o.male seen today for follow up of the following medical problems.   1. CAD - hx of prior anterior STEMI 04/2011, s/p DES to LAD. Repeat cath 04/2011 with patent stent and overall patent vessels.  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 Documented only 77% of THR, however discussed with technicians and they state he reached target heart rate.  - since last visit chest pain symptoms much improved.  - denies any chest pain. Denies any SOB or DOE - compliant with meds   2. HTN - checks twice daily at home. Typically 110s-150s/70-80s. - notes some low blood pressures. Bp 90/40s, typically later in the day after taking meds.  - he has cut back lopressor back 25mg  bid.    3. Hyperlipidemia - intolerant to statins, has been tried on multiple.  - tolerating pravastatin well.  4. Palpitations - 48 hr montior showed NSR with frequent PVCs.TSH 3.114,  - coffee x 2 cups in AM, iced tea at night. Occas sodas. No EtoH.  - denies any recent palpitations   SH: retired Clinical biochemist.  Past Medical History  Diagnosis Date  . Hypertension   . Prostate cancer 2007  . Carotid artery disease     carotid dopplers 05/27/2008, showed moderate left ICA disease, and disease in left ECA   . Hyperlipidemia, mixed   . Peripheral arterial disease     moderate left ICA disease, mild right ICA disease  . Coronary artery disease   . Myocardial infarction 05/21/2011    anterior wall, treated by Dr. [name] with 3.0x24mm Promus drug eluting stent to proximal LAD      Allergies  Allergen Reactions  . Adhesive [Tape] Hives    Hives  And itching from the adhesive on the pads of the heart monitor      Current Outpatient Prescriptions  Medication Sig Dispense Refill  .  amLODipine (NORVASC) 5 MG tablet TAKE ONE TABLET BY MOUTH ONCE DAILY. 30 tablet 6  . aspirin 81 MG tablet Take 81 mg by mouth daily. Take two tablets daily    . Coenzyme Q10 (CO Q-10) 200 MG CAPS Take 1 capsule by mouth daily.    Marland Kitchen losartan (COZAAR) 50 MG tablet TAKE ONE TABLET BY MOUTH ONCE DAILY. 30 tablet 6  . metoprolol tartrate (LOPRESSOR) 25 MG tablet Take 50 mg am, and 25 mg at night 180 tablet 3  . nitroGLYCERIN (NITROSTAT) 0.4 MG SL tablet Place 1 tablet (0.4 mg total) under the tongue every 5 (five) minutes as needed for chest pain. 25 tablet 3   No current facility-administered medications for this visit.     Past Surgical History  Procedure Laterality Date  . Prostatectomy  2007  . Hernia repair    . Cardiac catheterization  05/23/2011, 05/21/2011    05/21/2011   3.0 x 39mm Promus Element drug eluting stent positioned in proximal LAD, stenosis taken from 99% to 0%.     Allergies  Allergen Reactions  . Adhesive [Tape] Hives    Hives  And itching from the adhesive on the pads of the heart monitor       Family History  Problem Relation Age of Onset  . Heart failure Father     CABG at  age 77  . Leukemia Mother      Social History Mr. Cretella reports that he quit smoking about 38 years ago. He started smoking about 45 years ago. He has never used smokeless tobacco. Mr. Escher reports that he does not drink alcohol.   Review of Systems CONSTITUTIONAL: No weight loss, fever, chills, weakness or fatigue.  HEENT: Eyes: No visual loss, blurred vision, double vision or yellow sclerae.No hearing loss, sneezing, congestion, runny nose or sore throat.  SKIN: No rash or itching.  CARDIOVASCULAR: per hpi RESPIRATORY: No shortness of breath, cough or sputum.  GASTROINTESTINAL: No anorexia, nausea, vomiting or diarrhea. No abdominal pain or blood.  GENITOURINARY: No burning on urination, no polyuria NEUROLOGICAL: No headache, dizziness, syncope, paralysis, ataxia, numbness or  tingling in the extremities. No change in bowel or bladder control.  MUSCULOSKELETAL: No muscle, back pain, joint pain or stiffness.  LYMPHATICS: No enlarged nodes. No history of splenectomy.  PSYCHIATRIC: No history of depression or anxiety.  ENDOCRINOLOGIC: No reports of sweating, cold or heat intolerance. No polyuria or polydipsia.  Marland Kitchen   Physical Examination Filed Vitals:   05/22/15 0808  BP: 130/78  Pulse: 55   Filed Vitals:   05/22/15 0808  Height: 5\' 8"  (1.727 m)  Weight: 180 lb (81.647 kg)    Gen: resting comfortably, no acute distress HEENT: no scleral icterus, pupils equal round and reactive, no palptable cervical adenopathy,  CV: RRR, no m/r/g, no jvd Resp: Clear to auscultation bilaterally GI: abdomen is soft, non-tender, non-distended, normal bowel sounds, no hepatosplenomegaly MSK: extremities are warm, no edema.  Skin: warm, no rash Neuro:  no focal deficits Psych: appropriate affect   Diagnostic Studies 04/2011 Cath HEMODYNAMIC FINDINGS: Central aortic pressure 109/68. Left ventricular  pressure 105/0/15.  ANGIOGRAPHIC FINDINGS:  1. The left main artery had no disease.  2. The left anterior descending was a large vessel that coursed to the  apex and gave off 5 small-caliber diagonal branches. Proximal  vessel has a 99% subtotal occlusion. The midvessel had two areas  of tubular 40% stenosis.  3. The circumflex artery had mild plaque disease.  4. The right coronary artery was a large dominant vessel with mild  plaque disease in the proximal mid vessel.  5. Left ventricular angiogram was performed in the RAO projection and  showed severe left ventricular systolic dysfunction with ejection  fraction of 30%. The anterior wall had severe hypokinesis.  IMPRESSION:  1. Anterior ST-elevation myocardial infarction.  2. Subtotal occlusion of the proximal left anterior descending artery.  3. Moderate left ventricular systolic dysfunction.  4.  Successful percutaneous transluminal coronary angioplasty with  placement of a drug-eluting stent in the proximal left anterior  descending x1.  10/2014 Exercise MPI IMPRESSION: 1. No reversible ischemia or infarction at the level of stress achieved, patient did not reach 85% of target heart rate.  2. Normal left ventricular wall motion.  3. Left ventricular ejection fraction 52%  4. Low-risk stress test findings*. Please note test sensitivity is decreased, patient did not reach target heart rate.   10/2014 Carotid US  IMPRESSION: 1. Interval reduction in left-sided atherosclerotic plaque burden, though residual plaque results in persistently borderline elevated peak systolic velocities within the left internal carotid artery compatible with the lower end of the 50-69% luminal narrowing range. Further evaluation could be performed with CTA as clinically indicated. 2. Interval reduction in right-sided atherosclerotic plaque burden, with residual plaque not resulting in a hemodynamically significant stenosis.   Assessment and Plan  1. CAD - nuclear stress negative for ischemia, though per documentation only reached 77% of THR though in talking to technician she feels he did reach 85% - no recent symptoms, continue to follow  2. HTN - at goal. Notes some low bp's at home at times, will decrease losartan to 25mg  daily.   4. Hyperlipidemia - intolerant to statins, conintue dietary modification  5. Carotid stenosis - recent US 10/2014 with moderate disease on the left, mild on the right - continue to monitor  6. Palpitations - monitor shows frequent PVCs - symptoms controlled with beta blocker, will cotinue   F/u 6 months. Check annual labs   Arnoldo Lenis, M.D.

## 2015-05-22 NOTE — Patient Instructions (Addendum)
Your physician wants you to follow-up in: 6 months with Dr Bryna Colander will receive a reminder letter in the mail two months in advance. If you don't receive a letter, please call our office to schedule the follow-up appointment.    DECREASE Metoprolol to 25 mg twice aday   Decrease Losartan to  25 mg daily    Please get FASTING lab work

## 2015-05-25 ENCOUNTER — Other Ambulatory Visit: Payer: Self-pay | Admitting: Cardiology

## 2015-05-25 LAB — COMPREHENSIVE METABOLIC PANEL
ALK PHOS: 66 U/L (ref 40–115)
ALT: 20 U/L (ref 9–46)
AST: 19 U/L (ref 10–35)
Albumin: 4.5 g/dL (ref 3.6–5.1)
BILIRUBIN TOTAL: 0.6 mg/dL (ref 0.2–1.2)
BUN: 14 mg/dL (ref 7–25)
CO2: 29 mmol/L (ref 20–31)
CREATININE: 0.85 mg/dL (ref 0.70–1.25)
Calcium: 9.4 mg/dL (ref 8.6–10.3)
Chloride: 104 mmol/L (ref 98–110)
Glucose, Bld: 97 mg/dL (ref 65–99)
POTASSIUM: 5 mmol/L (ref 3.5–5.3)
Sodium: 141 mmol/L (ref 135–146)
TOTAL PROTEIN: 6.4 g/dL (ref 6.1–8.1)

## 2015-05-25 LAB — CBC
HCT: 45.5 % (ref 39.0–52.0)
Hemoglobin: 15.4 g/dL (ref 13.0–17.0)
MCH: 30.7 pg (ref 26.0–34.0)
MCHC: 33.8 g/dL (ref 30.0–36.0)
MCV: 90.6 fL (ref 78.0–100.0)
MPV: 9.9 fL (ref 8.6–12.4)
PLATELETS: 199 10*3/uL (ref 150–400)
RBC: 5.02 MIL/uL (ref 4.22–5.81)
RDW: 13.6 % (ref 11.5–15.5)
WBC: 5.6 10*3/uL (ref 4.0–10.5)

## 2015-05-25 LAB — LIPID PANEL
CHOL/HDL RATIO: 4.8 ratio (ref <=5.0)
CHOLESTEROL: 182 mg/dL (ref 125–200)
HDL: 38 mg/dL — ABNORMAL LOW (ref >=40)
LDL Cholesterol: 117 mg/dL (ref <130)
Triglycerides: 136 mg/dL (ref <150)
VLDL: 27 mg/dL (ref <30)

## 2015-05-25 LAB — HEMOGLOBIN A1C
HEMOGLOBIN A1C: 5.6 % (ref <5.7)
MEAN PLASMA GLUCOSE: 114 mg/dL (ref <117)

## 2015-05-25 LAB — MAGNESIUM: MAGNESIUM: 2.1 mg/dL (ref 1.5–2.5)

## 2015-05-26 LAB — PSA

## 2015-11-18 ENCOUNTER — Encounter: Payer: Self-pay | Admitting: Cardiology

## 2015-11-18 ENCOUNTER — Ambulatory Visit (INDEPENDENT_AMBULATORY_CARE_PROVIDER_SITE_OTHER): Payer: PPO | Admitting: Cardiology

## 2015-11-18 VITALS — BP 125/76 | HR 59 | Ht 68.0 in | Wt 182.0 lb

## 2015-11-18 DIAGNOSIS — R002 Palpitations: Secondary | ICD-10-CM | POA: Diagnosis not present

## 2015-11-18 DIAGNOSIS — I10 Essential (primary) hypertension: Secondary | ICD-10-CM

## 2015-11-18 DIAGNOSIS — I2510 Atherosclerotic heart disease of native coronary artery without angina pectoris: Secondary | ICD-10-CM | POA: Diagnosis not present

## 2015-11-18 DIAGNOSIS — I6523 Occlusion and stenosis of bilateral carotid arteries: Secondary | ICD-10-CM | POA: Diagnosis not present

## 2015-11-18 DIAGNOSIS — E785 Hyperlipidemia, unspecified: Secondary | ICD-10-CM

## 2015-11-18 NOTE — Patient Instructions (Signed)
Your physician recommends that you continue on your current medications as directed. Please refer to the Current Medication list given to you today. Your physician recommends that you schedule a follow-up appointment in: 6 months. You will receive a reminder letter in the mail in about 4 months reminding you to call and schedule your appointment. If you don't receive this letter, please contact our office. 

## 2015-11-18 NOTE — Progress Notes (Addendum)
Patient ID: [name], male   DOB: 1951-04-10, 65 y.o.   MRN: [id number]     Clinical Summary Mr. Hedtke is a 65 y.o.male seen today for follow up of the following medical problems.   1. CAD - hx of prior anterior STEMI 04/2011, s/p DES to LAD. Repeat cath 04/2011 with patent stent and overall patent vessels.  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 Documented only 77% of THR, however discussed with technicians and they state he reached target heart rate.    - can have some chest pressure with very high levels of activity, can have some SOB. Resolves quickly with rest   2. HTN - bp's at home 120s/70s.  - compliant with meds  3. Hyperlipidemia - intolerant to statins, has been tried on multiple.    4. Palpitations - 48 hr montior showed NSR with frequent PVCs.TSH 3.114,  - since cutting back on caffeine symptoms have resolved.  Past Medical History  Diagnosis Date  . Hypertension   . Prostate cancer 2007  . Carotid artery disease     carotid dopplers 05/27/2008, showed moderate left ICA disease, and disease in left ECA   . Hyperlipidemia, mixed   . Peripheral arterial disease     moderate left ICA disease, mild right ICA disease  . Coronary artery disease   . Myocardial infarction ([place]) 05/21/2011    anterior wall, treated by Dr. [name] with 3.0x76mm Promus drug eluting stent to proximal LAD      Allergies  Allergen Reactions  . Adhesive [Tape] Hives    Hives  And itching from the adhesive on the pads of the heart monitor      Current Outpatient Prescriptions  Medication Sig Dispense Refill  . amLODipine (NORVASC) 5 MG tablet Take 1 tablet (5 mg total) by mouth daily. 30 tablet 6  . aspirin 81 MG tablet Take 81 mg by mouth daily. Take two tablets daily    . Coenzyme Q10 (CO Q-10) 200 MG CAPS Take 1 capsule by mouth daily.    Marland Kitchen losartan (COZAAR) 25 MG  tablet Take 1 tablet (25 mg total) by mouth daily. 90 tablet 3  . metoprolol tartrate (LOPRESSOR) 25 MG tablet Take 1 tablet (25 mg total) by mouth 2 (two) times daily. 180 tablet 3  . nitroGLYCERIN (NITROSTAT) 0.4 MG SL tablet Place 1 tablet (0.4 mg total) under the tongue every 5 (five) minutes as needed for chest pain. 25 tablet 3  . pravastatin (PRAVACHOL) 40 MG tablet Take 1 tablet (40 mg total) by mouth daily. 30 tablet 6   No current facility-administered medications for this visit.     Past Surgical History  Procedure Laterality Date  . Prostatectomy  2007  . Hernia repair    . Cardiac catheterization  05/23/2011, 05/21/2011    05/21/2011   3.0 x 62mm Promus Element drug eluting stent positioned in proximal LAD, stenosis taken from 99% to 0%.     Allergies  Allergen Reactions  . Adhesive [Tape] Hives    Hives  And itching from the adhesive on the pads of the heart monitor       Family History  Problem Relation Age of Onset  . Heart failure Father     CABG at age 74  . Leukemia Mother      Social History Mr. Lamoureaux reports that he quit smoking about 39  years ago. He started smoking about 45 years ago. He has never used smokeless tobacco. Mr. Wojton reports that he does not drink alcohol.   Review of Systems CONSTITUTIONAL: No weight loss, fever, chills, weakness or fatigue.  HEENT: Eyes: No visual loss, blurred vision, double vision or yellow sclerae.No hearing loss, sneezing, congestion, runny nose or sore throat.  SKIN: No rash or itching.  CARDIOVASCULAR: per HPI RESPIRATORY: No shortness of breath, cough or sputum.  GASTROINTESTINAL: No anorexia, nausea, vomiting or diarrhea. No abdominal pain or blood.  GENITOURINARY: No burning on urination, no polyuria NEUROLOGICAL: No headache, dizziness, syncope, paralysis, ataxia, numbness or tingling in the extremities. No change in bowel or bladder control.  MUSCULOSKELETAL: No muscle, back pain, joint pain or  stiffness.  LYMPHATICS: No enlarged nodes. No history of splenectomy.  PSYCHIATRIC: No history of depression or anxiety.  ENDOCRINOLOGIC: No reports of sweating, cold or heat intolerance. No polyuria or polydipsia.  Marland Kitchen   Physical Examination Filed Vitals:   11/18/15 0952  BP: 125/76  Pulse: 59   Filed Vitals:   11/18/15 0952  Height: 5\' 8"  (1.727 m)  Weight: 182 lb (82.555 kg)    Gen: resting comfortably, no acute distress HEENT: no scleral icterus, pupils equal round and reactive, no palptable cervical adenopathy,  CV: RRR, no m/r/g, no jvd Resp: Clear to auscultation bilaterally GI: abdomen is soft, non-tender, non-distended, normal bowel sounds, no hepatosplenomegaly MSK: extremities are warm, no edema.  Skin: warm, no rash Neuro:  no focal deficits Psych: appropriate affect   Diagnostic Studies 04/2011 Cath HEMODYNAMIC FINDINGS: Central aortic pressure 109/68. Left ventricular  pressure 105/0/15.  ANGIOGRAPHIC FINDINGS:  1. The left main artery had no disease.  2. The left anterior descending was a large vessel that coursed to the  apex and gave off 5 small-caliber diagonal branches. Proximal  vessel has a 99% subtotal occlusion. The midvessel had two areas  of tubular 40% stenosis.  3. The circumflex artery had mild plaque disease.  4. The right coronary artery was a large dominant vessel with mild  plaque disease in the proximal mid vessel.  5. Left ventricular angiogram was performed in the RAO projection and  showed severe left ventricular systolic dysfunction with ejection  fraction of 30%. The anterior wall had severe hypokinesis.  IMPRESSION:  1. Anterior ST-elevation myocardial infarction.  2. Subtotal occlusion of the proximal left anterior descending artery.  3. Moderate left ventricular systolic dysfunction.  4. Successful percutaneous transluminal coronary angioplasty with  placement of a drug-eluting stent in the proximal left  anterior  descending x1.  10/2014 Exercise MPI IMPRESSION: 1. No reversible ischemia or infarction at the level of stress achieved, patient did not reach 85% of target heart rate.  2. Normal left ventricular wall motion.  3. Left ventricular ejection fraction 52%  4. Low-risk stress test findings*. Please note test sensitivity is decreased, patient did not reach target heart rate.   10/2014 Carotid US  IMPRESSION: 1. Interval reduction in left-sided atherosclerotic plaque burden, though residual plaque results in persistently borderline elevated peak systolic velocities within the left internal carotid artery compatible with the lower end of the 50-69% luminal narrowing range. Further evaluation could be performed with CTA as clinically indicated. 2. Interval reduction in right-sided atherosclerotic plaque burden, with residual plaque not resulting in a hemodynamically significant stenosis.    11/18/15 Clinic EKG (performed and reviewed in clinic): NSR  Assessment and Plan  1. CAD - nuclear stress negative for ischemia, though per  documentation only reached 77% of THR though in talking to technician she feels he did reach 85% - mild symptoms at times only at very high levels of exertion, we will continue to monitor - continue current meds  2. HTN - at goal, continue current meds.   4. Hyperlipidemia - intolerant to statins, conintue dietary modification  5. Carotid stenosis - [place] 10/2014 with moderate disease on the left, mild on the right - continue to monitor, we will repeat carotid US  6. Palpitations - monitor shows frequent PVCs - symptoms resolved with decrease in caffeine and beta blocker - continue to monitor   F/u 6 months.       Arnoldo Lenis, M.D.

## 2015-11-20 ENCOUNTER — Telehealth: Payer: Self-pay | Admitting: *Deleted

## 2015-11-20 DIAGNOSIS — I6529 Occlusion and stenosis of unspecified carotid artery: Secondary | ICD-10-CM

## 2015-11-20 NOTE — Telephone Encounter (Signed)
Pt aware and agreeable to carotid US, will forward to schedulers, orders placed

## 2015-11-20 NOTE — Telephone Encounter (Signed)
-----   Message from Arnoldo Lenis, MD sent at 11/20/2015  8:52 AM EST ----- Patient needs repeat carotid US, I did not mention this to him at our recent appt  J BrancH MD

## 2015-11-25 ENCOUNTER — Ambulatory Visit: Payer: PPO

## 2015-11-25 DIAGNOSIS — I6529 Occlusion and stenosis of unspecified carotid artery: Secondary | ICD-10-CM | POA: Diagnosis not present

## 2015-11-30 ENCOUNTER — Telehealth: Payer: Self-pay | Admitting: *Deleted

## 2015-11-30 NOTE — Telephone Encounter (Signed)
-----   Message from Arnoldo Lenis, MD sent at 11/27/2015  1:27 PM EST ----- Carotid US shows only mild blockages on both sides, we will continue to monitor  Zandra Abts MD

## 2015-11-30 NOTE — Telephone Encounter (Signed)
Pt aware, routed to pcp 

## 2015-12-15 ENCOUNTER — Ambulatory Visit (INDEPENDENT_AMBULATORY_CARE_PROVIDER_SITE_OTHER): Payer: PPO

## 2015-12-15 ENCOUNTER — Other Ambulatory Visit: Payer: Self-pay | Admitting: *Deleted

## 2015-12-15 ENCOUNTER — Ambulatory Visit (HOSPITAL_COMMUNITY)
Admission: RE | Admit: 2015-12-15 | Discharge: 2015-12-15 | Disposition: A | Discharge status: Home / Self Care | Payer: PPO | Source: Ambulatory Visit | Attending: Orthopaedic Surgery | Admitting: Orthopaedic Surgery

## 2015-12-15 ENCOUNTER — Encounter: Payer: Self-pay | Admitting: Orthopaedic Surgery

## 2015-12-15 ENCOUNTER — Ambulatory Visit (INDEPENDENT_AMBULATORY_CARE_PROVIDER_SITE_OTHER): Payer: PPO | Admitting: Orthopaedic Surgery

## 2015-12-15 VITALS — BP 137/76 | HR 54 | Temp 98.1°F | Resp 16 | Ht 68.0 in | Wt 182.0 lb

## 2015-12-15 DIAGNOSIS — M4602 Spinal enthesopathy, cervical region: Secondary | ICD-10-CM | POA: Insufficient documentation

## 2015-12-15 DIAGNOSIS — M47892 Other spondylosis, cervical region: Secondary | ICD-10-CM | POA: Insufficient documentation

## 2015-12-15 DIAGNOSIS — M50222 Other cervical disc displacement at C5-C6 level: Secondary | ICD-10-CM | POA: Insufficient documentation

## 2015-12-15 DIAGNOSIS — M542 Cervicalgia: Secondary | ICD-10-CM

## 2015-12-15 DIAGNOSIS — M4802 Spinal stenosis, cervical region: Secondary | ICD-10-CM | POA: Insufficient documentation

## 2015-12-15 DIAGNOSIS — M25511 Pain in right shoulder: Secondary | ICD-10-CM

## 2015-12-15 DIAGNOSIS — M5021 Other cervical disc displacement,  high cervical region: Secondary | ICD-10-CM | POA: Diagnosis not present

## 2015-12-15 NOTE — Progress Notes (Signed)
   Subjective:    Patient ID: [name], male    DOB: May 05, 1951, 65 y.o.   MRN: [id number]  Shoulder Pain  This is a chronic problem. The current episode started more than 1 year ago. There has been a history of trauma. The problem occurs every several days. The problem has been gradually improving. The quality of the pain is described as aching. The pain is at a severity of 2/10. The pain is mild. The symptoms are aggravated by activity. He has tried NSAIDS and rest for the symptoms. The treatment provided moderate relief.   He had a head injury in the week after Christmas and before New Year's.  He hit his head firmly on a heating duct in his house that caused his neck to go backwards a good deal.  He has had neck pain and right shoulder pain since then.  It has gradually gotten better.  He thought he was OK until last week when he tried to shave he could not lift his right arm at all and it went numb.   He has had more weakness of the right arm since then.  He has no new head injury, no loss of consciousness, no other weaknesses.  He has pain that shoots down the right arm at times.  He hurt his right shoulder in the past in 1975 and has had problems since then.    Review of Systems  Respiratory: Negative for shortness of breath.   Cardiovascular: Negative for chest pain and leg swelling.  Endocrine: Positive for cold intolerance.  Musculoskeletal: Positive for myalgias, arthralgias and neck pain.  Allergic/Immunologic: Positive for environmental allergies.  All other systems reviewed and are negative.      Objective:   Physical Exam  Constitutional: He is oriented to person, place, and time. He appears well-developed and well-nourished.  HENT:  Head: Normocephalic and atraumatic.  Eyes: Conjunctivae and EOM are normal. Pupils are equal, round, and reactive to light.  Neck: Normal range of motion. Neck supple.  Cardiovascular: Normal rate, regular rhythm and intact distal pulses.    Pulmonary/Chest: Effort normal.  Abdominal: Soft.  Musculoskeletal: He exhibits tenderness (he has some pain of the right upper posterior neck.  His shoulder has no pain and full motion.).  Neurological: He is alert and oriented to person, place, and time. He has normal reflexes. He displays normal reflexes. No cranial nerve deficit. He exhibits normal muscle tone. Coordination normal.  He has weakness of the triceps and biceps on the right, more on the triceps with 3 of 5 and biceps 4 of 5.  It is noticeable.  He has good grip but weaker than the left.  He is right hand dominant.  Cranial nerves are intact.    Skin: Skin is warm and dry.  Psychiatric: He has a normal mood and affect. His behavior is normal. Judgment and thought content normal.          Assessment & Plan:  He has neck pain with history of weakness on the right upper extremity also noted on physical examination.  I am concerned about a neck injury.  I would like to get a MRI of the neck.  If this is negative, then he will need a MRI of his brain.  Clinically he is alert with only the right upper extremity weakness.    I am asking for the MRI to be done asap because of the clinical findings.

## 2015-12-15 NOTE — Patient Instructions (Signed)
WE WILL SCHEDULE MRI AND CALL YOU WITH APPT

## 2015-12-17 ENCOUNTER — Ambulatory Visit (INDEPENDENT_AMBULATORY_CARE_PROVIDER_SITE_OTHER): Payer: PPO | Admitting: Orthopaedic Surgery

## 2015-12-17 VITALS — BP 128/60 | HR 65 | Temp 97.9°F | Ht 68.0 in | Wt 180.0 lb

## 2015-12-17 DIAGNOSIS — S0990XD Unspecified injury of head, subsequent encounter: Secondary | ICD-10-CM | POA: Diagnosis not present

## 2015-12-17 DIAGNOSIS — M5020 Other cervical disc displacement, unspecified cervical region: Secondary | ICD-10-CM | POA: Diagnosis not present

## 2015-12-17 NOTE — Patient Instructions (Addendum)
We will refer you to neurosurgery for neck  We will order MRI of head and call you with the appt

## 2015-12-18 NOTE — Progress Notes (Signed)
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 [id number]  Chief Complaint  Patient presents with  . Results    MRI Results    HPI  [name] is a 65 y.o. male who is here for follow-up of right shoulder and right arm pain with weakness.  He had a MRI of the cervical spine done two days ago.  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  Right foraminal narrowing C3-4 due to disc protrusion and spurring.  Moderate foraminal stenosis bilaterally C4-5 due to spurring.  Small right paracentral disc protrusion C5-6 without foraminal Encroachment.  Moderate foraminal narrowing on the right at C6-7 due to spurring.  I have gone over the findings.  He was weak with the triceps and biceps last visit.  Today he is not as weak.  He says he gets worse at the end of the day and evening.  He says today is a good day.  He has no numbness today.  He had a head injury right before the New Year.  I asked about memory problems and he says he is getting forgetful more than usual.  Some days are worse than others.  He left the water on in the kitchen sink and the water flooded his kitchen.  He forgets things easy now.  I am concerned about a possible subdural.  I will get a MRI of the brain.  I would like for the neurosurgeon to see him about the MRI of the neck.  Although he has no definitive HNP I am concerned about the weakness he has on the right at times. HPI  Body mass index is 27.38 kg/(m^2).   Review of Systems  Respiratory: Negative for shortness of breath.   Cardiovascular: Negative for chest pain and leg swelling.  Endocrine: Positive for cold intolerance.  Musculoskeletal: Positive for myalgias, arthralgias and neck pain.  Allergic/Immunologic: Positive for environmental allergies.  All other systems reviewed and are negative.   Past Medical History  Diagnosis Date  . Hypertension   . Prostate cancer ([place])  2007  . Carotid artery disease ([place])     carotid dopplers 05/27/2008, showed moderate left ICA disease, and disease in left ECA   . Hyperlipidemia, mixed   . Peripheral arterial disease ([facility])     moderate left ICA disease, mild right ICA disease  . Coronary artery disease   . Myocardial infarction ([place]) 05/21/2011    anterior wall, treated by Dr. [name] with 3.0x15mm Promus drug eluting stent to proximal LAD     Past Surgical History  Procedure Laterality Date  . Prostatectomy  2007  . Hernia repair    . Cardiac catheterization  05/23/2011, 05/21/2011    05/21/2011   3.0 x 67mm Promus Element drug eluting stent positioned in proximal LAD, stenosis taken from 99% to 0%.    Family History  Problem Relation Age of Onset  . Heart failure Father     CABG at age 89  . Leukemia Mother     Social History Social History  Substance Use Topics  . Smoking status: Former Smoker -- 1.00 packs/day for 7 years    Types: Cigarettes    Start date: 03/05/1970    Quit date: 09/26/1976  . Smokeless tobacco: Never Used  . Alcohol Use: No    Allergies  Allergen Reactions  . Adhesive [Tape] Hives    Hives  And itching from the adhesive on the pads of the  heart monitor     Current Outpatient Prescriptions  Medication Sig Dispense Refill  . amLODipine (NORVASC) 5 MG tablet Take 1 tablet (5 mg total) by mouth daily. 30 tablet 6  . aspirin 81 MG tablet Take 162 mg by mouth daily.     . Coenzyme Q10 (CO Q-10) 100 MG CAPS Take 1 capsule by mouth daily.    Marland Kitchen losartan (COZAAR) 25 MG tablet Take 1 tablet (25 mg total) by mouth daily. 90 tablet 3  . metoprolol tartrate (LOPRESSOR) 25 MG tablet Take 25 mg by mouth 2 (two) times daily. And 25 mg at night    . nitroGLYCERIN (NITROSTAT) 0.4 MG SL tablet Place 1 tablet (0.4 mg total) under the tongue every 5 (five) minutes as needed for chest pain. 25 tablet 3   No current facility-administered medications for this visit.     Physical  Exam  Blood pressure 128/60, pulse 65, temperature 97.9 F (36.6 C), height 5\' 8"  (1.727 m), weight 180 lb (81.647 kg).  Constitutional: overall normal hygiene, normal nutrition, well developed, normal grooming, normal body habitus. Assistive device:none  Musculoskeletal: gait and station Limp none, muscle tone and strength are normal, no tremors or atrophy is present.  .  Neurological: coordination overall normal.  Deep tendon reflex/nerve stretch intact.  Sensation normal.  Cranial nerves II-XII intact.   Skin:   normal overall no scars, lesions, ulcers or rashes. No psoriasis.  Psychiatric: Alert and oriented x 3.  Recent memory intact, remote memory unclear.  Normal mood and affect. Well groomed.  Good eye contact.  Cardiovascular: overall no swelling, no varicosities, no edema bilaterally, normal temperatures of the legs and arms, no clubbing, cyanosis and good capillary refill.  Lymphatic: palpation is normal.   Extremities:he has full ROM of both shoulders today.  He has no pain in the shoulder on the right today and his neck is not tender. Inspection normal Strength and tone normal of the right upper arm.  Last time he had definitive weakness of 3.5 to 4 of 5 more with the triceps. Range of motion full  Additional services performed: set up for neurosurgery evaluation and get a MRI of the brain.  The patient has been educated about the nature of the problem(s) and counseled on treatment options.  The patient appeared to understand what I have discussed and is in agreement with it.  Encounter Diagnoses  Name Primary?  . Protruded cervical disc Yes  . Head injury, subsequent encounter     PLAN Call if any problems.  Precautions discussed.  Continue current medications.   Return to clinic get MRI of the brain and set up neurosurgery evaluation

## 2015-12-22 ENCOUNTER — Telehealth: Payer: Self-pay | Admitting: *Deleted

## 2015-12-22 NOTE — Telephone Encounter (Signed)
REFERRAL AND OFFICE NOTES FAXED TO [facility]. AWAITING RESPONSE.

## 2015-12-29 ENCOUNTER — Ambulatory Visit (HOSPITAL_COMMUNITY)
Admission: RE | Admit: 2015-12-29 | Discharge: 2015-12-29 | Disposition: A | Discharge status: Home / Self Care | Payer: PPO | Source: Ambulatory Visit | Attending: Orthopaedic Surgery | Admitting: Orthopaedic Surgery

## 2015-12-29 ENCOUNTER — Other Ambulatory Visit: Payer: Self-pay | Admitting: Orthopaedic Surgery

## 2015-12-29 DIAGNOSIS — S0990XD Unspecified injury of head, subsequent encounter: Secondary | ICD-10-CM

## 2015-12-29 DIAGNOSIS — S0990XA Unspecified injury of head, initial encounter: Secondary | ICD-10-CM | POA: Diagnosis not present

## 2015-12-29 DIAGNOSIS — R531 Weakness: Secondary | ICD-10-CM | POA: Diagnosis not present

## 2015-12-31 ENCOUNTER — Ambulatory Visit (INDEPENDENT_AMBULATORY_CARE_PROVIDER_SITE_OTHER): Payer: PPO | Admitting: Orthopaedic Surgery

## 2015-12-31 ENCOUNTER — Encounter: Payer: Self-pay | Admitting: Orthopaedic Surgery

## 2015-12-31 VITALS — BP 121/75 | HR 59 | Temp 97.5°F | Ht 68.0 in | Wt 180.0 lb

## 2015-12-31 DIAGNOSIS — M25511 Pain in right shoulder: Secondary | ICD-10-CM

## 2015-12-31 DIAGNOSIS — S0990XD Unspecified injury of head, subsequent encounter: Secondary | ICD-10-CM

## 2015-12-31 NOTE — Addendum Note (Signed)
Addended by: Baldomero Lamy B on: 12/31/2015 05:12 PM   Modules accepted: Orders

## 2015-12-31 NOTE — Progress Notes (Signed)
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 [id number]  Chief Complaint  Patient presents with  . Results    MRI, head    HPI  [name] is a 65 y.o. male who is here for follow-up of MRI of the brain.  The MRI was negative for acute findings.  I explained the results to him.  He tells me today that he gave me the wrong date of injury.  It was actually the weekend of Thanksgiving, a full month earlier than he told me before.  I had him tentatively set up to see a neurosurgeon as I was worried about a subdural.  I will change this now to a neurologist, Dr. Merlene Laughter.  The patient is agreeable to seeing him.  He still has some memory loss at times and he often forgets things.  I told him to have his wife go to the appointment also to help answer questions.  He still has some right shoulder pain but not as much.  He does not want to go to PT. HPI  Body mass index is 27.38 kg/(m^2).  Review of Systems  HENT: Negative for congestion.   Respiratory: Negative for shortness of breath.   Cardiovascular: Negative for chest pain and leg swelling.  Endocrine: Positive for cold intolerance.  Musculoskeletal: Positive for myalgias, arthralgias and neck pain.  Allergic/Immunologic: Positive for environmental allergies.  All other systems reviewed and are negative.   Past Medical History  Diagnosis Date  . Hypertension   . Prostate cancer ([place]) 2007  . Carotid artery disease ([place])     carotid dopplers 05/27/2008, showed moderate left ICA disease, and disease in left ECA   . Hyperlipidemia, mixed   . Peripheral arterial disease ([facility])     moderate left ICA disease, mild right ICA disease  . Coronary artery disease   . Myocardial infarction ([place]) 05/21/2011    anterior wall, treated by Dr. [name] with 3.0x66mm Promus drug eluting stent to proximal LAD     Past Surgical History  Procedure Laterality Date  . Prostatectomy  2007  . Hernia repair    . Cardiac  catheterization  05/23/2011, 05/21/2011    05/21/2011   3.0 x 72mm Promus Element drug eluting stent positioned in proximal LAD, stenosis taken from 99% to 0%.    Family History  Problem Relation Age of Onset  . Heart failure Father     CABG at age 54  . Leukemia Mother     Social History Social History  Substance Use Topics  . Smoking status: Former Smoker -- 1.00 packs/day for 7 years    Types: Cigarettes    Start date: 03/05/1970    Quit date: 09/26/1976  . Smokeless tobacco: Never Used  . Alcohol Use: No    Allergies  Allergen Reactions  . Adhesive [Tape] Hives    Hives  And itching from the adhesive on the pads of the heart monitor     Current Outpatient Prescriptions  Medication Sig Dispense Refill  . amLODipine (NORVASC) 5 MG tablet Take 1 tablet (5 mg total) by mouth daily. 30 tablet 6  . aspirin 81 MG tablet Take 162 mg by mouth daily.     . Coenzyme Q10 (CO Q-10) 100 MG CAPS Take 1 capsule by mouth daily.    Marland Kitchen losartan (COZAAR) 25 MG tablet Take 1 tablet (25 mg total) by mouth daily. 90 tablet 3  . metoprolol tartrate (LOPRESSOR) 25 MG tablet Take 25 mg by mouth  2 (two) times daily. And 25 mg at night    . nitroGLYCERIN (NITROSTAT) 0.4 MG SL tablet Place 1 tablet (0.4 mg total) under the tongue every 5 (five) minutes as needed for chest pain. 25 tablet 3   No current facility-administered medications for this visit.     Physical Exam  Blood pressure 121/75, pulse 59, temperature 97.5 F (36.4 C), temperature source Tympanic, height 5\' 8"  (1.727 m), weight 180 lb (81.647 kg).  Constitutional: overall normal hygiene, normal nutrition, well developed, normal grooming, normal body habitus. Assistive device:none  Musculoskeletal: gait and station Limp none, muscle tone and strength are normal, no tremors or atrophy is present.  .  Neurological: coordination overall normal.  Deep tendon reflex/nerve stretch intact.  Sensation normal.  Cranial nerves II-XII  intact.   Skin:   normal overall no scars, lesions, ulcers or rashes. No psoriasis.  Psychiatric: Alert and oriented x 3.  Recent memory intact, remote memory unclear.  Normal mood and affect. Well groomed.  Good eye contact.  Cardiovascular: overall no swelling, no varicosities, no edema bilaterally, normal temperatures of the legs and arms, no clubbing, cyanosis and good capillary refill.  Lymphatic: palpation is normal.  Examination of right Upper Extremity is done.  Inspection:   Overall:  Elbow non-tender without crepitus or defects, forearm non-tender without crepitus or defects, wrist non-tender without crepitus or defects, hand non-tender.    Shoulder: with glenohumeral joint tenderness, without effusion.   Upper arm: without swelling and tenderness   Range of motion:   Overall:  Full range of motion of the elbow, full range of motion of wrist and full range of motion in fingers.   Shoulder:  right  180 degrees forward flexion; 180 degrees abduction; 35 degrees internal rotation, 35 degrees external rotation, 20 degrees extension, 40 degrees adduction.   Stability:   Overall:  Shoulder, elbow and wrist stable   Strength and Tone:   Overall full shoulder muscles strength, full upper arm strength and normal upper arm bulk and tone.  The patient has been educated about the nature of the problem(s) and counseled on treatment options.  The patient appeared to understand what I have discussed and is in agreement with it.  Encounter Diagnoses  Name Primary?  . Right shoulder pain Yes  . Head injury, subsequent encounter     PLAN Call if any problems.  Precautions discussed.  Continue current medications.   Return to clinic 1 month  See Neurologist for evaluation for post concussion.

## 2015-12-31 NOTE — Telephone Encounter (Signed)
Per Dr Luna Glasgow cancel referral, Neurology referral first  [facility] aware

## 2016-01-04 ENCOUNTER — Telehealth: Payer: Self-pay | Admitting: *Deleted

## 2016-01-04 NOTE — Telephone Encounter (Signed)
REFERRAL FAXED TO DR [facility]

## 2016-01-09 ENCOUNTER — Other Ambulatory Visit: Payer: Self-pay | Admitting: Cardiology

## 2016-01-13 ENCOUNTER — Other Ambulatory Visit: Payer: Self-pay | Admitting: Cardiology

## 2016-01-13 NOTE — Telephone Encounter (Signed)
Spoke with patient and advised that Dr Luna Glasgow wants him to see Neurology before Neurosurgery. Patient understands and has appointment and follow up at our office

## 2016-01-13 NOTE — Telephone Encounter (Signed)
Patient called to relay that he was contacted by Dr Merlene Laughter, and has been scheduled for appointment 01/25/16, 3:15p.m, however, he states he was advised by Dr Luna Glasgow that the referral was to have been made to [facility]. He also has a follow up appointment in our office for 01/28/16. Patient's ph#'[phone] (Home) and [phone] (Mobile)  Please advise.

## 2016-01-14 ENCOUNTER — Telehealth: Payer: Self-pay | Admitting: *Deleted

## 2016-01-14 NOTE — Telephone Encounter (Signed)
Patient has an appointment with Dr. Merlene Laughter 01/25/16 at 3:15 pm

## 2016-01-28 ENCOUNTER — Ambulatory Visit: Payer: PPO | Admitting: Orthopaedic Surgery

## 2016-03-12 ENCOUNTER — Encounter (HOSPITAL_COMMUNITY): Payer: Self-pay | Admitting: *Deleted

## 2016-03-12 ENCOUNTER — Ambulatory Visit (HOSPITAL_COMMUNITY)
Admission: EM | Admit: 2016-03-12 | Discharge: 2016-03-12 | Disposition: A | Discharge status: Home / Self Care | Payer: PPO | Attending: Family Medicine | Admitting: Family Medicine

## 2016-03-12 DIAGNOSIS — S0501XA Injury of conjunctiva and corneal abrasion without foreign body, right eye, initial encounter: Secondary | ICD-10-CM | POA: Diagnosis not present

## 2016-03-12 MED ORDER — TOBRAMYCIN 0.3 % OP OINT
TOPICAL_OINTMENT | OPHTHALMIC | Status: AC
  Filled 2016-03-12: qty 3.5

## 2016-03-12 MED ORDER — TOBRAMYCIN 0.3 % OP OINT
TOPICAL_OINTMENT | Freq: Three times a day (TID) | OPHTHALMIC | Status: DC
  Administered 2016-03-12: 18:00:00 via OPHTHALMIC

## 2016-03-12 MED ORDER — TETRACAINE HCL 0.5 % OP SOLN
OPHTHALMIC | Status: AC
  Filled 2016-03-12: qty 2

## 2016-03-12 NOTE — Discharge Instructions (Signed)
Use ointment 3 times a day for 5 days, see eye doctor if further problems.

## 2016-03-12 NOTE — ED Notes (Signed)
Pt  Reports  Was  Cutting  [name]  And  A  Piece  Of  Kohl's   May  Have  Gotten into r  Eye  He  Reports  Pain  And  Burning   Sensation      he  Reports  The  Vision is  blured  Due  To  Pain

## 2016-03-12 NOTE — ED Provider Notes (Signed)
CSN: [id number]     Arrival date & time 03/12/16  1544 History   First MD Initiated Contact with Patient 03/12/16 1710     Chief Complaint  Patient presents with  . Eye Injury   (Consider location/radiation/quality/duration/timing/severity/associated sxs/prior Treatment) Patient is a 65 y.o. male presenting with eye injury. The history is provided by the patient and the spouse.  Eye Injury This is a new problem. The current episode started 6 to 12 hours ago (puylling branches and was pocked in right eye by twig.). The problem has not changed since onset.   Past Medical History  Diagnosis Date  . Hypertension   . Prostate cancer ([place] [facility]) 2007  . Carotid artery disease ([place])     carotid dopplers 05/27/2008, showed moderate left ICA disease, and disease in left ECA   . Hyperlipidemia, mixed   . Peripheral arterial disease ([facility])     moderate left ICA disease, mild right ICA disease  . Coronary artery disease   . Myocardial infarction ([place]) 05/21/2011    anterior wall, treated by Dr. [name] with 3.0x53mm Promus drug eluting stent to proximal LAD    Past Surgical History  Procedure Laterality Date  . Prostatectomy  2007  . Hernia repair    . Cardiac catheterization  05/23/2011, 05/21/2011    05/21/2011   3.0 x 25mm Promus Element drug eluting stent positioned in proximal LAD, stenosis taken from 99% to 0%.   Family History  Problem Relation Age of Onset  . Heart failure Father     CABG at age 38  . Leukemia Mother    Social History  Substance Use Topics  . Smoking status: Former Smoker -- 1.00 packs/day for 7 years    Types: Cigarettes    Start date: 03/05/1970    Quit date: 09/26/1976  . Smokeless tobacco: Never Used  . Alcohol Use: No    Review of Systems  Constitutional: Negative.   Eyes: Positive for photophobia, pain, redness and visual disturbance.  All other systems reviewed and are negative.   Allergies  Adhesive  Home Medications   Prior to  Admission medications   Medication Sig Start Date End Date Taking? Authorizing Provider  amLODipine (NORVASC) 5 MG tablet TAKE ONE TABLET BY MOUTH ONCE DAILY. 01/11/16   Arnoldo Lenis, MD  aspirin 81 MG tablet Take 162 mg by mouth daily.     Historical Provider, MD  Coenzyme Q10 (CO Q-10) 100 MG CAPS Take 1 capsule by mouth daily.    Historical Provider, MD  losartan (COZAAR) 25 MG tablet Take 1 tablet (25 mg total) by mouth daily. 05/22/15   Arnoldo Lenis, MD  metoprolol tartrate (LOPRESSOR) 25 MG tablet TAKE (2) TABLETS BY MOUTH IN THE MORNING AND (1) TABLET AT NIGHT. 01/13/16   Arnoldo Lenis, MD  nitroGLYCERIN (NITROSTAT) 0.4 MG SL tablet Place 1 tablet (0.4 mg total) under the tongue every 5 (five) minutes as needed for chest pain. 04/24/14   Arnoldo Lenis, MD   Meds Ordered and Administered this Visit   Medications  tobramycin (TOBREX) 0.3 % ophthalmic ointment (not administered)    BP 157/85 mmHg  Pulse 56  Temp(Src) 98.6 F (37 C) (Oral)  SpO2 99% No data found.   Physical Exam  Constitutional: He is oriented to person, place, and time. He appears well-developed and well-nourished.  Eyes: EOM and lids are normal. Pupils are equal, round, and reactive to light. Lids are everted and swept, no foreign bodies found.  Right eye exhibits no discharge. No foreign body present in the right eye. Left eye exhibits no discharge. Right conjunctiva is injected.  Slit lamp exam:      The right eye shows corneal abrasion and fluorescein uptake. The right eye shows no corneal flare, no corneal ulcer, no foreign body and no hyphema.    Neck: Normal range of motion. Neck supple.  Lymphadenopathy:    He has no cervical adenopathy.  Neurological: He is alert and oriented to person, place, and time.  Skin: Skin is warm and dry.  Nursing note and vitals reviewed.   ED Course  Procedures (including critical care time)  Labs Review Labs Reviewed - No data to display  Imaging  Review No results found.   Visual Acuity Review  Right Eye Distance:   Left Eye Distance:   Bilateral Distance:    Right Eye Near:   Left Eye Near:    Bilateral Near:         MDM   1. Corneal abrasion, right, initial encounter        Billy Fischer, MD 03/12/16 1757

## 2016-03-18 DIAGNOSIS — H0015 Chalazion left lower eyelid: Secondary | ICD-10-CM | POA: Diagnosis not present

## 2016-05-02 DIAGNOSIS — H0014 Chalazion left upper eyelid: Secondary | ICD-10-CM | POA: Diagnosis not present

## 2016-05-16 ENCOUNTER — Encounter: Payer: Self-pay | Admitting: Cardiology

## 2016-05-16 ENCOUNTER — Ambulatory Visit (INDEPENDENT_AMBULATORY_CARE_PROVIDER_SITE_OTHER): Payer: PPO | Admitting: Cardiology

## 2016-05-16 VITALS — BP 130/79 | HR 61 | Ht 68.0 in | Wt 179.2 lb

## 2016-05-16 DIAGNOSIS — E782 Mixed hyperlipidemia: Secondary | ICD-10-CM

## 2016-05-16 DIAGNOSIS — I2510 Atherosclerotic heart disease of native coronary artery without angina pectoris: Secondary | ICD-10-CM | POA: Diagnosis not present

## 2016-05-16 DIAGNOSIS — I714 Abdominal aortic aneurysm, without rupture, unspecified: Secondary | ICD-10-CM

## 2016-05-16 DIAGNOSIS — R002 Palpitations: Secondary | ICD-10-CM | POA: Diagnosis not present

## 2016-05-16 DIAGNOSIS — R7309 Other abnormal glucose: Secondary | ICD-10-CM | POA: Diagnosis not present

## 2016-05-16 MED ORDER — AMLODIPINE BESYLATE 2.5 MG PO TABS: 2.5000 mg | ORAL_TABLET | Freq: Every day | ORAL | 6 refills | Status: DC

## 2016-05-16 NOTE — Progress Notes (Signed)
Clinical Summary Mr. Sessler is a 65 y.o.male seen today for follow up of the following medical problems.   1. CAD - hx of prior anterior STEMI 04/2011, s/p DES to LAD. Repeat cath 04/2011 with patent stent and overall patent vessels.  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 Documented only 77% of THR, however discussed with technicians and they state he reached target heart rate.     - denies any chest pain. No SOB or DOE. Walks up to 7 miles a day without troubles.  - compliant with meds   2. HTN - compliant with meds - notes some low bp's at times in the evenings. He checks his bp 3 times a day, typically prior to his walk, after his walk, and later in the evening. Early AM numbers can be elevated at times, however after he takes his meds steady downtrend in bp's.   3. Hyperlipidemia - intolerant to statins, has been tried on multiple.  - has not had lipid panel yet this year, has no pcp   4. Palpitations - 48 hr montior showed NSR with frequent PVCs.TSH 3.114 - since cutting back on caffeine symptoms have resolved.  - no recent episodes Past Medical History:  Diagnosis Date  . Carotid artery disease ([place])    carotid dopplers 05/27/2008, showed moderate left ICA disease, and disease in left ECA   . Coronary artery disease   . Hyperlipidemia, mixed   . Hypertension   . Myocardial infarction ([place]) 05/21/2011   anterior wall, treated by Dr. [name] with 3.0x12mm Promus drug eluting stent to proximal LAD   . Peripheral arterial disease ([facility])    moderate left ICA disease, mild right ICA disease  . Prostate cancer ([place]) 2007     Allergies  Allergen Reactions  . Adhesive [Tape] Hives    Hives  And itching from the adhesive on the pads of the heart monitor      Current Outpatient Prescriptions  Medication Sig Dispense Refill  . amLODipine (NORVASC) 5 MG  tablet TAKE ONE TABLET BY MOUTH ONCE DAILY. 30 tablet 6  . aspirin 81 MG tablet Take 162 mg by mouth daily.     . Coenzyme Q10 (CO Q-10) 100 MG CAPS Take 1 capsule by mouth daily.    Marland Kitchen losartan (COZAAR) 25 MG tablet Take 1 tablet (25 mg total) by mouth daily. 90 tablet 3  . metoprolol tartrate (LOPRESSOR) 25 MG tablet TAKE (2) TABLETS BY MOUTH IN THE MORNING AND (1) TABLET AT NIGHT. 180 tablet 3  . nitroGLYCERIN (NITROSTAT) 0.4 MG SL tablet Place 1 tablet (0.4 mg total) under the tongue every 5 (five) minutes as needed for chest pain. 25 tablet 3   No current facility-administered medications for this visit.      Past Surgical History:  Procedure Laterality Date  . CARDIAC CATHETERIZATION  05/23/2011, 05/21/2011   05/21/2011   3.0 x 80mm Promus Element drug eluting stent positioned in proximal LAD, stenosis taken from 99% to 0%.  Marland Kitchen HERNIA REPAIR    . PROSTATECTOMY  2007     Allergies  Allergen Reactions  . Adhesive [Tape] Hives    Hives  And itching from the adhesive on the pads of the heart monitor       Family History  Problem Relation Age of Onset  . Heart failure Father     CABG  at age 19  . Leukemia Mother      Social History Mr. Lefevre reports that he quit smoking about 39 years ago. His smoking use included Cigarettes. He started smoking about 46 years ago. He has a 7.00 pack-year smoking history. He has never used smokeless tobacco. Mr. Tomac reports that he does not drink alcohol.   Review of Systems CONSTITUTIONAL: No weight loss, fever, chills, weakness or fatigue.  HEENT: Eyes: No visual loss, blurred vision, double vision or yellow sclerae.No hearing loss, sneezing, congestion, runny nose or sore throat.  SKIN: No rash or itching.  CARDIOVASCULAR:  Per HPI RESPIRATORY: No shortness of breath, cough or sputum.  GASTROINTESTINAL: No anorexia, nausea, vomiting or diarrhea. No abdominal pain or blood.  GENITOURINARY: No burning on urination, no  polyuria NEUROLOGICAL: No headache, dizziness, syncope, paralysis, ataxia, numbness or tingling in the extremities. No change in bowel or bladder control.  MUSCULOSKELETAL: No muscle, back pain, joint pain or stiffness.  LYMPHATICS: No enlarged nodes. No history of splenectomy.  PSYCHIATRIC: No history of depression or anxiety.  ENDOCRINOLOGIC: No reports of sweating, cold or heat intolerance. No polyuria or polydipsia.  Marland Kitchen   Physical Examination Vitals:   05/16/16 0842  BP: 130/79  Pulse: 61   Vitals:   05/16/16 0842  Weight: 179 lb 3.2 oz (81.3 kg)  Height: 5\' 8"  (1.727 m)    Gen: resting comfortably, no acute distress HEENT: no scleral icterus, pupils equal round and reactive, no palptable cervical adenopathy,  CV: RRR, no m/r/g, no jvd Resp: Clear to auscultation bilaterally GI: abdomen is soft, non-tender, non-distended, normal bowel sounds, no hepatosplenomegaly MSK: extremities are warm, no edema.  Skin: warm, no rash Neuro:  no focal deficits Psych: appropriate affect   Diagnostic Studies 04/2011 Cath HEMODYNAMIC FINDINGS: Central aortic pressure 109/68. Left ventricular  pressure 105/0/15.  ANGIOGRAPHIC FINDINGS:  1. The left main artery had no disease.  2. The left anterior descending was a large vessel that coursed to the  apex and gave off 5 small-caliber diagonal branches. Proximal  vessel has a 99% subtotal occlusion. The midvessel had two areas  of tubular 40% stenosis.  3. The circumflex artery had mild plaque disease.  4. The right coronary artery was a large dominant vessel with mild  plaque disease in the proximal mid vessel.  5. Left ventricular angiogram was performed in the RAO projection and  showed severe left ventricular systolic dysfunction with ejection  fraction of 30%. The anterior wall had severe hypokinesis.  IMPRESSION:  1. Anterior ST-elevation myocardial infarction.  2. Subtotal occlusion of the proximal left anterior  descending artery.  3. Moderate left ventricular systolic dysfunction.  4. Successful percutaneous transluminal coronary angioplasty with  placement of a drug-eluting stent in the proximal left anterior  descending x1.  10/2014 Exercise MPI IMPRESSION: 1. No reversible ischemia or infarction at the level of stress achieved, patient did not reach 85% of target heart rate.  2. Normal left ventricular wall motion.  3. Left ventricular ejection fraction 52%  4. Low-risk stress test findings*. Please note test sensitivity is decreased, patient did not reach target heart rate.   10/2014 Carotid US  IMPRESSION: 1. Interval reduction in left-sided atherosclerotic plaque burden, though residual plaque results in persistently borderline elevated peak systolic velocities within the left internal carotid artery compatible with the lower end of the 50-69% luminal narrowing range. Further evaluation could be performed with CTA as clinically indicated. 2. Interval reduction in right-sided atherosclerotic plaque burden, with  residual plaque not resulting in a hemodynamically significant stenosis.    11/18/15 Clinic EKG (performed and reviewed in clinic): NSR    Assessment and Plan  1. CAD -no recent symptoms, continue current meds  2. HTN - some low bp's in the evenings, we will decrease his norvasc to 2.5mg  daily.   4. Hyperlipidemia - intolerant to statins, conintue dietary modification - 04/2015 TC 182 TG 136 HDL 38 LDL 117 - repeat lipid panel.   5. Carotid stenosis - mild bilateral disease by [place] this year, continue to monitor.   6. Palpitations - monitor shows frequent PVCs - symptoms resolved with decrease in caffeine and beta blocker - continue beta blocker  7. AAA screen - male over age of 30 former smoker, order AAA screen US>    Given list of pcp's and encouraged to establish. Will order his annual labs F/u 1 year. He is to call and update [place] if  bp's remain low in evenings.     Arnoldo Lenis, M.D.

## 2016-05-16 NOTE — Patient Instructions (Signed)
Medication Instructions:   Decrease Amlodipine to 2.5mg  daily.  Continue all other medications.    Labwork:  CMET, Mg, FLP, HgA1c, CBC, TSH - orders given today.  Office will contact with results via phone or letter.    Testing/Procedures:  Your physician has requested that you have an abdominal aorta duplex. During this test, an ultrasound is used to evaluate the aorta. Allow 30 minutes for this exam. Do not eat after midnight the day before and avoid carbonated beverages.  Office will contact with results via phone or letter.    Follow-Up: Your physician wants you to follow up in:  1 year.  You will receive a reminder letter in the mail one-two months in advance.  If you don't receive a letter, please call our office to schedule the follow up appointment   Any Other Special Instructions Will Be Listed Below (If Applicable).  If you need a refill on your cardiac medications before your next appointment, please call your pharmacy.

## 2016-05-17 ENCOUNTER — Other Ambulatory Visit: Payer: Self-pay | Admitting: Cardiology

## 2016-05-17 DIAGNOSIS — I2510 Atherosclerotic heart disease of native coronary artery without angina pectoris: Secondary | ICD-10-CM | POA: Diagnosis not present

## 2016-05-17 DIAGNOSIS — R002 Palpitations: Secondary | ICD-10-CM | POA: Diagnosis not present

## 2016-05-17 DIAGNOSIS — E782 Mixed hyperlipidemia: Secondary | ICD-10-CM | POA: Diagnosis not present

## 2016-05-17 DIAGNOSIS — R7309 Other abnormal glucose: Secondary | ICD-10-CM | POA: Diagnosis not present

## 2016-05-17 LAB — COMPREHENSIVE METABOLIC PANEL
ALT: 20 U/L (ref 9–46)
AST: 17 U/L (ref 10–35)
Albumin: 4.1 g/dL (ref 3.6–5.1)
Alkaline Phosphatase: 64 U/L (ref 40–115)
BUN: 16 mg/dL (ref 7–25)
CHLORIDE: 105 mmol/L (ref 98–110)
CO2: 26 mmol/L (ref 20–31)
CREATININE: 0.9 mg/dL (ref 0.70–1.25)
Calcium: 9 mg/dL (ref 8.6–10.3)
GLUCOSE: 90 mg/dL (ref 65–99)
Potassium: 4.6 mmol/L (ref 3.5–5.3)
SODIUM: 140 mmol/L (ref 135–146)
Total Bilirubin: 0.5 mg/dL (ref 0.2–1.2)
Total Protein: 6.4 g/dL (ref 6.1–8.1)

## 2016-05-17 LAB — CBC
HCT: 46.7 % (ref 38.5–50.0)
Hemoglobin: 16.1 g/dL (ref 13.2–17.1)
MCH: 31.1 pg (ref 27.0–33.0)
MCHC: 34.5 g/dL (ref 32.0–36.0)
MCV: 90.3 fL (ref 80.0–100.0)
MPV: 9.3 fL (ref 7.5–12.5)
PLATELETS: 220 10*3/uL (ref 140–400)
RBC: 5.17 MIL/uL (ref 4.20–5.80)
RDW: 13.3 % (ref 11.0–15.0)
WBC: 5.5 10*3/uL (ref 3.8–10.8)

## 2016-05-17 LAB — MAGNESIUM: MAGNESIUM: 2 mg/dL (ref 1.5–2.5)

## 2016-05-17 LAB — LIPID PANEL
Cholesterol: 194 mg/dL (ref 125–200)
HDL: 40 mg/dL (ref >=40)
LDL CALC: 133 mg/dL — AB (ref <130)
TRIGLYCERIDES: 103 mg/dL (ref <150)
Total CHOL/HDL Ratio: 4.9 Ratio (ref <=5.0)
VLDL: 21 mg/dL (ref <30)

## 2016-05-17 LAB — TSH: TSH: 2.92 m[IU]/L (ref 0.40–4.50)

## 2016-05-18 LAB — HEMOGLOBIN A1C
Hgb A1c MFr Bld: 5.3 % (ref <5.7)
Mean Plasma Glucose: 105 mg/dL

## 2016-05-19 ENCOUNTER — Telehealth: Payer: Self-pay | Admitting: *Deleted

## 2016-05-19 NOTE — Telephone Encounter (Signed)
Pt doesn't want to try Zetia at this time. Will come by office to pick up copy of lab results. Routed to pcp

## 2016-05-19 NOTE — Telephone Encounter (Signed)
-----   Message from Arnoldo Lenis, MD sent at 05/18/2016  2:49 PM EDT ----- Labs show high cholesterol. He has not been able to take statins, would like to try an alternative zetia 10mg  daily please  Zandra Abts MD

## 2016-06-08 ENCOUNTER — Ambulatory Visit: Payer: PPO

## 2016-06-08 DIAGNOSIS — I700 Atherosclerosis of aorta: Secondary | ICD-10-CM | POA: Diagnosis not present

## 2016-06-08 DIAGNOSIS — I714 Abdominal aortic aneurysm, without rupture, unspecified: Secondary | ICD-10-CM

## 2016-06-08 DIAGNOSIS — Z136 Encounter for screening for cardiovascular disorders: Secondary | ICD-10-CM | POA: Diagnosis not present

## 2016-06-11 ENCOUNTER — Other Ambulatory Visit: Payer: Self-pay | Admitting: Cardiology

## 2016-06-13 ENCOUNTER — Telehealth: Payer: Self-pay | Admitting: *Deleted

## 2016-06-13 NOTE — Telephone Encounter (Signed)
-----   Message from Arnoldo Lenis, MD sent at 06/13/2016 11:39 AM EDT ----- Abdominal US is normal   Zandra Abts MD

## 2016-06-15 NOTE — Telephone Encounter (Signed)
Pt aware, routed to pcp 

## 2016-07-29 ENCOUNTER — Encounter (HOSPITAL_COMMUNITY): Payer: Self-pay | Admitting: *Deleted

## 2016-07-29 ENCOUNTER — Emergency Department (HOSPITAL_COMMUNITY)
Admission: EM | Admit: 2016-07-29 | Discharge: 2016-07-30 | Disposition: A | Discharge status: Home / Self Care | Payer: PPO | Source: Home / Self Care | Attending: Emergency Medicine | Admitting: Emergency Medicine

## 2016-07-29 ENCOUNTER — Emergency Department: Payer: Self-pay

## 2016-07-29 DIAGNOSIS — Z79899 Other long term (current) drug therapy: Secondary | ICD-10-CM

## 2016-07-29 DIAGNOSIS — I2510 Atherosclerotic heart disease of native coronary artery without angina pectoris: Secondary | ICD-10-CM | POA: Insufficient documentation

## 2016-07-29 DIAGNOSIS — Z87891 Personal history of nicotine dependence: Secondary | ICD-10-CM

## 2016-07-29 DIAGNOSIS — Y999 Unspecified external cause status: Secondary | ICD-10-CM | POA: Insufficient documentation

## 2016-07-29 DIAGNOSIS — S81811A Laceration without foreign body, right lower leg, initial encounter: Secondary | ICD-10-CM | POA: Diagnosis not present

## 2016-07-29 DIAGNOSIS — I10 Essential (primary) hypertension: Secondary | ICD-10-CM

## 2016-07-29 DIAGNOSIS — Z8546 Personal history of malignant neoplasm of prostate: Secondary | ICD-10-CM

## 2016-07-29 DIAGNOSIS — I82431 Acute embolism and thrombosis of right popliteal vein: Secondary | ICD-10-CM | POA: Diagnosis not present

## 2016-07-29 DIAGNOSIS — M7989 Other specified soft tissue disorders: Secondary | ICD-10-CM | POA: Insufficient documentation

## 2016-07-29 DIAGNOSIS — I824Z1 Acute embolism and thrombosis of unspecified deep veins of right distal lower extremity: Secondary | ICD-10-CM | POA: Diagnosis not present

## 2016-07-29 DIAGNOSIS — I252 Old myocardial infarction: Secondary | ICD-10-CM | POA: Insufficient documentation

## 2016-07-29 DIAGNOSIS — Y939 Activity, unspecified: Secondary | ICD-10-CM

## 2016-07-29 DIAGNOSIS — Y929 Unspecified place or not applicable: Secondary | ICD-10-CM

## 2016-07-29 DIAGNOSIS — Z7901 Long term (current) use of anticoagulants: Secondary | ICD-10-CM | POA: Diagnosis not present

## 2016-07-29 DIAGNOSIS — X501XXA Overexertion from prolonged static or awkward postures, initial encounter: Secondary | ICD-10-CM

## 2016-07-29 DIAGNOSIS — Z7982 Long term (current) use of aspirin: Secondary | ICD-10-CM

## 2016-07-29 DIAGNOSIS — R52 Pain, unspecified: Secondary | ICD-10-CM

## 2016-07-29 DIAGNOSIS — T79A21A Traumatic compartment syndrome of right lower extremity, initial encounter: Secondary | ICD-10-CM | POA: Diagnosis not present

## 2016-07-29 DIAGNOSIS — S86811A Strain of other muscle(s) and tendon(s) at lower leg level, right leg, initial encounter: Secondary | ICD-10-CM | POA: Diagnosis not present

## 2016-07-29 DIAGNOSIS — R200 Anesthesia of skin: Secondary | ICD-10-CM | POA: Diagnosis not present

## 2016-07-29 DIAGNOSIS — M79604 Pain in right leg: Secondary | ICD-10-CM | POA: Diagnosis not present

## 2016-07-29 LAB — CBC WITH DIFFERENTIAL/PLATELET
BASOS ABS: 0 10*3/uL (ref 0.0–0.1)
Basophils Relative: 0 %
EOS PCT: 0 %
Eosinophils Absolute: 0 10*3/uL (ref 0.0–0.7)
HCT: 45.1 % (ref 39.0–52.0)
HEMOGLOBIN: 15.2 g/dL (ref 13.0–17.0)
LYMPHS ABS: 1.7 10*3/uL (ref 0.7–4.0)
LYMPHS PCT: 16 %
MCH: 30.5 pg (ref 26.0–34.0)
MCHC: 33.7 g/dL (ref 30.0–36.0)
MCV: 90.4 fL (ref 78.0–100.0)
Monocytes Absolute: 0.8 10*3/uL (ref 0.1–1.0)
Monocytes Relative: 8 %
NEUTROS PCT: 76 %
Neutro Abs: 7.9 10*3/uL — ABNORMAL HIGH (ref 1.7–7.7)
PLATELETS: 237 10*3/uL (ref 150–400)
RBC: 4.99 MIL/uL (ref 4.22–5.81)
RDW: 12.8 % (ref 11.5–15.5)
WBC: 10.5 10*3/uL (ref 4.0–10.5)

## 2016-07-29 LAB — BASIC METABOLIC PANEL
Anion gap: 10 (ref 5–15)
BUN: 14 mg/dL (ref 6–20)
CHLORIDE: 104 mmol/L (ref 101–111)
CO2: 25 mmol/L (ref 22–32)
Calcium: 9.5 mg/dL (ref 8.9–10.3)
Creatinine, Ser: 0.98 mg/dL (ref 0.61–1.24)
Glucose, Bld: 101 mg/dL — ABNORMAL HIGH (ref 65–99)
POTASSIUM: 3.8 mmol/L (ref 3.5–5.1)
Sodium: 139 mmol/L (ref 135–145)

## 2016-07-29 LAB — PROTIME-INR
INR: 0.98
Prothrombin Time: 13 seconds (ref 11.4–15.2)

## 2016-07-29 LAB — CK: CK TOTAL: 236 U/L (ref 49–397)

## 2016-07-29 MED ORDER — HYDROMORPHONE HCL 2 MG/ML IJ SOLN
1.0000 mg | Freq: Once | INTRAMUSCULAR | Status: AC
  Administered 2016-07-29: 1 mg via INTRAVENOUS
  Filled 2016-07-29: qty 1

## 2016-07-29 MED ORDER — FENTANYL CITRATE (PF) 100 MCG/2ML IJ SOLN
50.0000 ug | Freq: Once | INTRAMUSCULAR | Status: DC
  Filled 2016-07-29: qty 2

## 2016-07-29 MED ORDER — ONDANSETRON HCL 4 MG/2ML IJ SOLN
INTRAMUSCULAR | Status: AC
  Administered 2016-07-29: 4 mg
  Filled 2016-07-29: qty 2

## 2016-07-29 MED ORDER — OXYCODONE HCL 5 MG PO TABS
10.0000 mg | ORAL_TABLET | Freq: Once | ORAL | Status: AC
  Administered 2016-07-29: 10 mg via ORAL
  Filled 2016-07-29: qty 2

## 2016-07-29 MED ORDER — FENTANYL CITRATE (PF) 100 MCG/2ML IJ SOLN
50.0000 ug | Freq: Once | INTRAMUSCULAR | Status: AC
  Administered 2016-07-29: 50 ug via INTRAVENOUS

## 2016-07-29 NOTE — ED Notes (Signed)
Surgeon at bedside at this time. 

## 2016-07-29 NOTE — ED Notes (Signed)
Cup of ice water provided to patient for fluid challenge.  

## 2016-07-29 NOTE — Consult Note (Signed)
[name] is an 65 y.o. male.    Chief Complaint: right calf pain and swelling  HPI: 65 y/o male states he was pushing a pickup truck at 1530 today, felt and heard loud pop to back of right calf. C/o immediate moderate to severe pain. Pt seen by local orthopedic clinic and referred to emergency department. Pt denies any trauma or fall to the right lower extremity. C/o pain with trying to plantar flex. Denies any previous problems with right lower extremity in the past.  PCP:  Glo Herring., MD  PMH: Past Medical History:  Diagnosis Date  . Carotid artery disease ([place])    carotid dopplers 05/27/2008, showed moderate left ICA disease, and disease in left ECA   . Coronary artery disease   . Hyperlipidemia, mixed   . Hypertension   . Myocardial infarction 05/21/2011   anterior wall, treated by Dr. [name] with 3.0x35m Promus drug eluting stent to proximal LAD   . Peripheral arterial disease ([facility])    moderate left ICA disease, mild right ICA disease  . [place]    PSH: Past Surgical History:  Procedure Laterality Date  . CARDIAC CATHETERIZATION  05/23/2011, 05/21/2011   05/21/2011   3.0 x 221mPromus Element drug eluting stent positioned in proximal LAD, stenosis taken from 99% to 0%.  . Marland KitchenERNIA REPAIR    . PROSTATECTOMY  2007    Social History:  reports that he quit smoking about 39 years ago. His smoking use included Cigarettes. He started smoking about 46 years ago. He has a 7.00 pack-year smoking history. He has never used smokeless tobacco. He reports that he does not drink alcohol or use drugs.  Allergies:  Allergies  Allergen Reactions  . Adhesive [Tape] Hives    Hives  And itching from the adhesive on the pads of the heart monitor     Medications: No current facility-administered medications for this encounter.    Current Outpatient Prescriptions  Medication Sig Dispense Refill  . amLODipine (NORVASC) 2.5 MG tablet Take 1 tablet  (2.5 mg total) by mouth daily. 30 tablet 6  . aspirin 81 MG tablet Take 162 mg by mouth daily.     . Coenzyme Q10 (CO Q-10) 100 MG CAPS Take 1 capsule by mouth daily.    . Marland Kitchenosartan (COZAAR) 25 MG tablet TAKE ONE TABLET BY MOUTH ONCE DAILY. 30 tablet 6  . metoprolol tartrate (LOPRESSOR) 25 MG tablet TAKE (2) TABLETS BY MOUTH IN THE MORNING AND (1) TABLET AT NIGHT. 180 tablet 3  . nitroGLYCERIN (NITROSTAT) 0.4 MG SL tablet Place 1 tablet (0.4 mg total) under the tongue every 5 (five) minutes as needed for chest pain. 25 tablet 3    Results for orders placed or performed during the hospital encounter of 07/29/16 (from the past 48 hour(s))  Basic metabolic panel     Status: Abnormal   Collection Time: 07/29/16  9:15 PM  Result Value Ref Range   Sodium 139 135 - 145 mmol/L   Potassium 3.8 3.5 - 5.1 mmol/L   Chloride 104 101 - 111 mmol/L   CO2 25 22 - 32 mmol/L   Glucose, Bld 101 (H) 65 - 99 mg/dL   BUN 14 6 - 20 mg/dL   Creatinine, Ser 0.98 0.61 - 1.24 mg/dL   Calcium 9.5 8.9 - 10.3 mg/dL   GFR calc non Af Amer >60 >60 mL/min   GFR calc Af Amer >60 >60 mL/min    Comment: (NOTE) The  eGFR has been calculated using the CKD EPI equation. This calculation has not been validated in all clinical situations. eGFR's persistently <60 mL/min signify possible Chronic Kidney Disease.    Anion gap 10 5 - 15  CBC with Differential     Status: Abnormal   Collection Time: 07/29/16  9:15 PM  Result Value Ref Range   WBC 10.5 4.0 - 10.5 K/uL   RBC 4.99 4.22 - 5.81 MIL/uL   Hemoglobin 15.2 13.0 - 17.0 g/dL   HCT 45.1 39.0 - 52.0 %   MCV 90.4 78.0 - 100.0 fL   MCH 30.5 26.0 - 34.0 pg   MCHC 33.7 30.0 - 36.0 g/dL   RDW 12.8 11.5 - 15.5 %   Platelets 237 150 - 400 K/uL   Neutrophils Relative % 76 %   Neutro Abs 7.9 (H) 1.7 - 7.7 K/uL   Lymphocytes Relative 16 %   Lymphs Abs 1.7 0.7 - 4.0 K/uL   Monocytes Relative 8 %   Monocytes Absolute 0.8 0.1 - 1.0 K/uL   Eosinophils Relative 0 %   Eosinophils  Absolute 0.0 0.0 - 0.7 K/uL   Basophils Relative 0 %   Basophils Absolute 0.0 0.0 - 0.1 K/uL  CK     Status: None   Collection Time: 07/29/16  9:15 PM  Result Value Ref Range   Total CK 236 49 - 397 U/L  Protime-INR     Status: None   Collection Time: 07/29/16  9:15 PM  Result Value Ref Range   Prothrombin Time 13.0 11.4 - 15.2 seconds   INR 0.98    Dg Outside Films Extremity  Result Date: 07/29/2016 CLINICAL DATA:  This exam is stored here for comparison purposes only and was performed at an outside facility.   Please contact the originating institution for any associated interpretation or report.    ROS: ROS  Pain with plantar flexion but no pain of right foot/ankle Unable to ambulate due to the pain  Physical Exam: Alert and appropriate 65 y/o male in no acute distress Right knee with full rom, no tenderness or effusion Achilles nontender. Normal thompson test. 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  All compartments are supple even though there is some mild edema and tenderness posteriorly. Seen pt with Dr. Lyla Glassing present. Recommend cam boot, elevation and crutches.  F/u in the office in 1-2 weeks Return to emergency room for any worsening symptoms including pain with passive ROM Pain management

## 2016-07-29 NOTE — ED Notes (Signed)
Ortho tech at bedside at this time 

## 2016-07-29 NOTE — ED Provider Notes (Signed)
MSE was initiated and I personally evaluated the patient and placed orders (if any) at  9:35 PM on July 29, 2016.  HPI; [name] is a 65 y.o. male with a PMHx of HTN, CAD, hyperlipidemia, who presents to the Emergency Department complaining of right lower leg swelling onset 3:30 PM today. Pt states that he was pushing a truck when he felt something pop to his right lower leg. Pt denies injuring his right leg in the past. Pt reports that he was seen at the orthopedist ([facility]) earlier today with negative xrays and was informed to come into the for evaluation of possible compartment syndrome. Pt is having associated symptoms of right knee pain, right lower leg pain, gait problem due to pain, numbness and tingling to right lower leg. He notes that he has not tried any medications for the relief of his symptoms. He denies color change, wound, rash, and any other symptoms. He is not anticoagulated. Xrays sent over by Orthopedics show no acute bony abnormality.  PE: Patient was evaluated by me and Dr. Jeneen Rinks. Patient is in extreme pain. His calf is tight and swollen. He has reported paresthesias. He is unable to ambulate and cannot dorsiflex his foot. <2 sec cap refill and 2+ DP pulse.  Spoke with OR RN working with Dr. Delfino Lovett who is currently in surgery. He will come evaluate patient ASAP.  The patient appears stable so that the remainder of the MSE may be completed by another provider.   Recardo Evangelist, PA-C 07/29/16 2201    Tanna Furry, MD 08/22/16 [phone]

## 2016-07-29 NOTE — ED Triage Notes (Signed)
The pt is c/o  Rt lower leg pain and swelling  He was pushing a truck and felt something pop  He can hardly walk on it now and the swelling has gotten worse.  Good pedal pulse rt leg  He came from a urgent care

## 2016-07-29 NOTE — ED Notes (Signed)
Paged Ortho Tech - per surgeon verbal order. Patient will need cam boot and crutches.

## 2016-07-29 NOTE — ED Provider Notes (Signed)
[facility] DEPT Provider Note   CSN: [id number] Arrival date & time: 07/29/16  1949     History   Chief Complaint Chief Complaint  Patient presents with  . Leg Swelling    HPI [name] is a 65 y.o. male with a hx of CAD (on ASA 162mg  QD), MI, HTN, peripheral arterial disease presents to the Emergency Department complaining of acute, persistent, progressively worsening Right lower leg pain and swelling onset 3:30 PM today. Patient reports he was pushing a truck when he felt some pop in his right lower leg. Since that time he has had increasing pain and swelling. He reports he was seen earlier today at the orthopedic office with negative x-rays and referred to the emergency department for evaluation of compartment syndrome. Patient has associated right knee pain, right lower leg pain, numbness and tingling in the right lower leg and difficulty walking due to pain. No open wound, rash or other injury. Patient is not anticoagulated but does take aspirin each day. He reports taking an additional 4 tablets of baby aspirin after onset of the pain without relief.     HPI  Past Medical History:  Diagnosis Date  . Carotid artery disease ([place])    carotid dopplers 05/27/2008, showed moderate left ICA disease, and disease in left ECA   . Coronary artery disease   . Hyperlipidemia, mixed   . Hypertension   . Myocardial infarction 05/21/2011   anterior wall, treated by Dr. [name] with 3.0x73mm Promus drug eluting stent to proximal LAD   . Peripheral arterial disease ([facility])    moderate left ICA disease, mild right ICA disease  . Prostate cancer [facility]) 2007    Patient Active Problem List   Diagnosis Date Noted  . Chest pain 10/08/2014  . Palpitations 10/08/2014  . Bilateral carotid bruits 10/08/2014  . CAD (coronary artery disease) 03/20/2013  . HTN (hypertension) 03/20/2013  . Mixed hyperlipidemia 03/20/2013    Past Surgical History:  Procedure Laterality Date  . CARDIAC  CATHETERIZATION  05/23/2011, 05/21/2011   05/21/2011   3.0 x 19mm Promus Element drug eluting stent positioned in proximal LAD, stenosis taken from 99% to 0%.  Marland Kitchen HERNIA REPAIR    . PROSTATECTOMY  2007       Home Medications    Prior to Admission medications   Medication Sig Start Date End Date Taking? Authorizing Provider  amLODipine (NORVASC) 2.5 MG tablet Take 1 tablet (2.5 mg total) by mouth daily. 05/16/16  Yes Arnoldo Lenis, MD  aspirin 81 MG tablet Take 162 mg by mouth daily.    Yes Historical Provider, MD  Coenzyme Q10 (CO Q-10) 100 MG CAPS Take 1 capsule by mouth daily.   Yes Historical Provider, MD  losartan (COZAAR) 25 MG tablet TAKE ONE TABLET BY MOUTH ONCE DAILY. 06/13/16  Yes Arnoldo Lenis, MD  metoprolol tartrate (LOPRESSOR) 25 MG tablet TAKE (2) TABLETS BY MOUTH IN THE MORNING AND (1) TABLET AT NIGHT. 01/13/16  Yes Arnoldo Lenis, MD  nitroGLYCERIN (NITROSTAT) 0.4 MG SL tablet Place 1 tablet (0.4 mg total) under the tongue every 5 (five) minutes as needed for chest pain. 04/24/14   Arnoldo Lenis, MD  oxyCODONE (ROXICODONE) 5 MG immediate release tablet Take 1-2 tablets (5-10 mg total) by mouth every 6 (six) hours as needed for severe pain. 07/30/16   Jarrett Soho Dajour [place]point, PA-C    Family History Family History  Problem Relation Age of Onset  . Leukemia Mother   .  Heart failure Father     CABG at age 61    Social History Social History  Substance Use Topics  . Smoking status: Former Smoker    Packs/day: 1.00    Years: 7.00    Types: Cigarettes    Start date: 03/05/1970    Quit date: 09/26/1976  . Smokeless tobacco: Never Used  . Alcohol use No     Allergies   Adhesive [tape]   Review of Systems Review of Systems  Cardiovascular: Positive for leg swelling ( right leg).  Musculoskeletal: Positive for arthralgias ( right leg).  All other systems reviewed and are negative.    Physical Exam Updated Vital Signs BP 153/86   Pulse 91   Temp 98.3  F (36.8 C) (Oral)   Resp 24   Ht 5\' 8"  (1.727 m)   Wt 81.6 kg   SpO2 100%   BMI 27.37 kg/m   Physical Exam  Constitutional: He appears well-developed and well-nourished. No distress.  Awake, alert, nontoxic appearance  HENT:  Head: Normocephalic and atraumatic.  Mouth/Throat: Oropharynx is clear and moist. No oropharyngeal exudate.  Eyes: Conjunctivae are normal. No scleral icterus.  Neck: Normal range of motion. Neck supple.  Cardiovascular: Normal rate, regular rhythm and intact distal pulses.   Pulses:      Radial pulses are 2+ on the right side, and 2+ on the left side.       Dorsalis pedis pulses are 2+ on the right side, and 2+ on the left side.  Pulmonary/Chest: Effort normal and breath sounds normal. No respiratory distress. He has no wheezes.  Equal chest expansion  Abdominal: Soft. Bowel sounds are normal. He exhibits no mass. There is no tenderness. There is no rebound and no guarding.  Musculoskeletal: Normal range of motion. He exhibits no edema.  Right leg: Significantly swollen with tense compartments and exquisite tenderness to palpation.  Patient able to wiggle toes on the right foot but unable to flex or plantar flex due to severe pain. Patient is unwilling to try. He is able to flex and extend his right hip but refuses to flex or extend the knee.  Significant tenderness to palpation over the posterior calf.  Bruising noted around the posterior calf  Neurological: He is alert.  Speech is clear and goal oriented Moves extremities without ataxia  Skin: Skin is warm and dry. He is not diaphoretic.  Psychiatric: He has a normal mood and affect.  Nursing note and vitals reviewed.      ED Treatments / Results  Labs (all labs ordered are listed, but only abnormal results are displayed) Labs Reviewed  BASIC METABOLIC PANEL - Abnormal; Notable for the following:       Result Value   Glucose, Bld 101 (*)    All other components within normal limits  CBC WITH  DIFFERENTIAL/PLATELET - Abnormal; Notable for the following:    Neutro Abs 7.9 (*)    All other components within normal limits  CK  PROTIME-INR     ED ECG REPORT   Date: 07/30/2016  Rate: 82  Rhythm: normal sinus rhythm  QRS Axis: normal  Intervals: normal  ST/T Wave abnormalities: normal  Conduction Disutrbances:none  Narrative Interpretation: artifact (pt was shaking), largely unchanged from 10/2014  Old EKG Reviewed: unchanged  I have personally reviewed the EKG tracing and agree with the computerized printout as noted.   Radiology Dg Outside Films Extremity  Result Date: 07/29/2016 CLINICAL DATA:  This exam is stored here for  comparison purposes only and was performed at an outside facility.   Please contact the originating institution for any associated interpretation or report.    Procedures Procedures (including critical care time)  Medications Ordered in ED Medications  fentaNYL (SUBLIMAZE) injection 50 mcg (50 mcg Intravenous Given 07/29/16 2125)  ondansetron (ZOFRAN) 4 MG/2ML injection (4 mg  Given 07/29/16 2149)  HYDROmorphone (DILAUDID) injection 1 mg (1 mg Intravenous Given 07/29/16 2221)  oxyCODONE (Oxy IR/ROXICODONE) immediate release tablet 10 mg (10 mg Oral Given 07/29/16 2334)     Initial Impression / Assessment and Plan / ED Course  I have reviewed the triage vital signs and the nursing notes.  Pertinent labs & imaging results that were available during my care of the patient were reviewed by me and considered in my medical decision making (see chart for details).  Clinical Course  Value Comment By Time   The patient was discussed with and seen by Dr. Alvino Chapel who agrees with the treatment plan.  Jarrett Soho Marce Charlesworth, PA-C 11/03 2214  WBC: 10.5 borderline Abigail Butts, PA-C 11/03 2214  BP: 146/96 VSS.  Tachycardia Abigail Butts, PA-C 11/03 2215   Dr. Lyla Glassing at bedside who reports this is not compartment syndrome and likely a plantaris  muscle.  He recommends d/c home with CAM walker, crutches and pain control. Jarrett Soho Adonai Helzer, PA-C 11/03 2312  CK Total: 236 normal Abigail Butts, PA-C 11/03 2323  EKG 12-Lead (Reviewed) Abigail Butts, PA-C 11/04 0018   Pt offered admission for pain control and he declines.   Jarrett Soho Tashara Suder, PA-C 11/04 0040    Pt presents with right leg pain and swelling with evidence of likely muscle rupture. Concern for compartment syndrome.  Pt evaluated by Dr. Lyla Glassing, orthopedic surgeon. He does not believe that this is compartment syndrome. He requests that the patient be discharged home. Patient reports he is still having significant pain in spite of fentanyl, Dilaudid and oxycodone.  Offered admission for pain control and repeat evaluation of the leg. Patient declines at this time reporting that he can go home and still hurt. His labs were reassuring. I have given strict return precautions including worsening pain, worsening swelling of the leg, development of numbness of the foot or any other concerning symptoms including chest pain or shortness of breath.  She should and wife state understanding and are in agreement with the plan.  Final Clinical Impressions(s) / ED Diagnoses   Final diagnoses:  Right leg swelling  Right leg pain    New Prescriptions New Prescriptions   OXYCODONE (ROXICODONE) 5 MG IMMEDIATE RELEASE TABLET    Take 1-2 tablets (5-10 mg total) by mouth every 6 (six) hours as needed for severe pain.     Jarrett Soho Maryfer Tauzin, PA-C 07/30/16 [place], MD 07/30/16 [phone]

## 2016-07-30 ENCOUNTER — Encounter (HOSPITAL_COMMUNITY): Payer: Self-pay | Admitting: *Deleted

## 2016-07-30 ENCOUNTER — Emergency Department (HOSPITAL_COMMUNITY)
Admission: EM | Admit: 2016-07-30 | Discharge: 2016-07-30 | Disposition: A | Discharge status: Home / Self Care | Payer: PPO | Attending: Emergency Medicine | Admitting: Emergency Medicine

## 2016-07-30 ENCOUNTER — Emergency Department (HOSPITAL_COMMUNITY): Admit: 2016-07-30 | Discharge: 2016-07-30 | Disposition: A | Discharge status: Home / Self Care | Payer: PPO

## 2016-07-30 DIAGNOSIS — I2510 Atherosclerotic heart disease of native coronary artery without angina pectoris: Secondary | ICD-10-CM | POA: Insufficient documentation

## 2016-07-30 DIAGNOSIS — Z7982 Long term (current) use of aspirin: Secondary | ICD-10-CM | POA: Insufficient documentation

## 2016-07-30 DIAGNOSIS — Z87891 Personal history of nicotine dependence: Secondary | ICD-10-CM | POA: Insufficient documentation

## 2016-07-30 DIAGNOSIS — I82431 Acute embolism and thrombosis of right popliteal vein: Secondary | ICD-10-CM | POA: Insufficient documentation

## 2016-07-30 DIAGNOSIS — R200 Anesthesia of skin: Secondary | ICD-10-CM

## 2016-07-30 DIAGNOSIS — T148XXA Other injury of unspecified body region, initial encounter: Secondary | ICD-10-CM

## 2016-07-30 DIAGNOSIS — I10 Essential (primary) hypertension: Secondary | ICD-10-CM | POA: Insufficient documentation

## 2016-07-30 DIAGNOSIS — S81811A Laceration without foreign body, right lower leg, initial encounter: Secondary | ICD-10-CM | POA: Diagnosis not present

## 2016-07-30 DIAGNOSIS — I252 Old myocardial infarction: Secondary | ICD-10-CM | POA: Insufficient documentation

## 2016-07-30 DIAGNOSIS — Z8546 Personal history of malignant neoplasm of prostate: Secondary | ICD-10-CM | POA: Insufficient documentation

## 2016-07-30 DIAGNOSIS — I824Z1 Acute embolism and thrombosis of unspecified deep veins of right distal lower extremity: Secondary | ICD-10-CM | POA: Diagnosis not present

## 2016-07-30 DIAGNOSIS — M7989 Other specified soft tissue disorders: Secondary | ICD-10-CM | POA: Diagnosis not present

## 2016-07-30 DIAGNOSIS — Z7901 Long term (current) use of anticoagulants: Secondary | ICD-10-CM | POA: Insufficient documentation

## 2016-07-30 MED ORDER — RIVAROXABAN (XARELTO) EDUCATION KIT FOR DVT/PE PATIENTS
PACK | Freq: Once | Status: AC
  Administered 2016-07-30: 21:00:00
  Filled 2016-07-30: qty 1

## 2016-07-30 MED ORDER — RIVAROXABAN (XARELTO) EDUCATION KIT FOR DVT/PE PATIENTS: PACK | 0 refills | Status: DC

## 2016-07-30 MED ORDER — RIVAROXABAN 15 MG PO TABS
15.0000 mg | ORAL_TABLET | Freq: Once | ORAL | Status: AC
  Administered 2016-07-30: 15 mg via ORAL
  Filled 2016-07-30: qty 1

## 2016-07-30 MED ORDER — OXYCODONE HCL 5 MG PO TABS: 5.0000 mg | ORAL_TABLET | Freq: Four times a day (QID) | ORAL | 0 refills | Status: DC | PRN

## 2016-07-30 NOTE — ED Provider Notes (Signed)
[facility] DEPT Provider Note   CSN: [id number] Arrival date & time: 07/30/16  1507     History   Chief Complaint Chief Complaint  Patient presents with  . Numbness    HPI [name] is a 65 y.o. male.  The history is provided by the patient and medical records. No language interpreter was used.   [name] is a 65 y.o. male  with a PMH of CAD, HTN, HLD, PAD who presents to the Emergency Department complaining of numbness to the dorsal aspect of the foot beginning this morning. Patient was seen in ED yesterday following an injury leading to right lower leg pain and swelling. Yesterday, there was concern for compartment syndrome therefore orthopedics was consulted and evaluated the patient in th ED. Orthopedics stated that there was likely a plantaris rupture but no signs of compartment syndrome. He was placed in a cam walker, told to elevate the extremity and to follow-up with orthopedics next week. Overnight, he endorses worsening swelling as well as numbness over the dorsal aspect of his foot. He states pain worsened last night, but has been improving throughout the day today. He also states that his right lower leg feels warmer than his left.   Past Medical History:  Diagnosis Date  . Carotid artery disease ([place])    carotid dopplers 05/27/2008, showed moderate left ICA disease, and disease in left ECA   . Coronary artery disease   . Hyperlipidemia, mixed   . Hypertension   . Myocardial infarction 05/21/2011   anterior wall, treated by Dr. [name] with 3.0x14m Promus drug eluting stent to proximal LAD   . Peripheral arterial disease ([facility])    moderate left ICA disease, mild right ICA disease  . Prostate cancer ([facility] 2007    Patient Active Problem List   Diagnosis Date Noted  . Chest pain 10/08/2014  . Palpitations 10/08/2014  . Bilateral carotid bruits 10/08/2014  . CAD (coronary artery disease) 03/20/2013  . HTN (hypertension) 03/20/2013  . Mixed  hyperlipidemia 03/20/2013    Past Surgical History:  Procedure Laterality Date  . CARDIAC CATHETERIZATION  05/23/2011, 05/21/2011   05/21/2011   3.0 x 269mPromus Element drug eluting stent positioned in proximal LAD, stenosis taken from 99% to 0%.  . Marland KitchenERNIA REPAIR    . PROSTATECTOMY  2007       Home Medications    Prior to Admission medications   Medication Sig Start Date End Date Taking? Authorizing Provider  acetaminophen (TYLENOL) 500 MG tablet Take 1,000 mg by mouth every 6 (six) hours as needed for mild pain.   Yes Historical Provider, MD  amLODipine (NORVASC) 2.5 MG tablet Take 1 tablet (2.5 mg total) by mouth daily. 05/16/16  Yes JoArnoldo LenisMD  aspirin 81 MG tablet Take 162 mg by mouth daily.    Yes Historical Provider, MD  Coenzyme Q10 (CO Q-10) 100 MG CAPS Take 1 capsule by mouth daily.   Yes Historical Provider, MD  losartan (COZAAR) 25 MG tablet TAKE ONE TABLET BY MOUTH ONCE DAILY. 06/13/16  Yes JoArnoldo LenisMD  magnesium 30 MG tablet Take 30 mg by mouth at bedtime as needed (sleep).   Yes Historical Provider, MD  metoprolol tartrate (LOPRESSOR) 25 MG tablet TAKE (2) TABLETS BY MOUTH IN THE MORNING AND (1) TABLET AT NIGHT. Patient taking differently: TAKE (1) TABLETS BY MOUTH IN THE MORNING AND (1) TABLET AT NIGHT. 01/13/16  Yes JoArnoldo LenisMD  nitroGLYCERIN (NITROSTAT) 0.4  MG SL tablet Place 1 tablet (0.4 mg total) under the tongue every 5 (five) minutes as needed for chest pain. 04/24/14   Arnoldo Lenis, MD  oxyCODONE (ROXICODONE) 5 MG immediate release tablet Take 1-2 tablets (5-10 mg total) by mouth every 6 (six) hours as needed for severe pain. Patient not taking: Reported on 07/30/2016 07/30/16   Jarrett Soho [place], PA-C  rivaroxaban Alveda Reasons) KIT Take as directed on kit. 07/30/16   Ozella Almond Ward, PA-C    Family History Family History  Problem Relation Age of Onset  . Leukemia Mother   . Heart failure Father     CABG at age 37     Social History Social History  Substance Use Topics  . Smoking status: Former Smoker    Packs/day: 1.00    Years: 7.00    Types: Cigarettes    Start date: 03/05/1970    Quit date: 09/26/1976  . Smokeless tobacco: Never Used  . Alcohol use No     Allergies   Adhesive [tape]; Demerol [meperidine]; and Fentanyl   Review of Systems Review of Systems  Constitutional: Negative for fever.  HENT: Negative for congestion.   Eyes: Negative for visual disturbance.  Respiratory: Negative for cough and shortness of breath.   Cardiovascular: Negative.   Gastrointestinal: Negative for abdominal pain.  Musculoskeletal: Positive for arthralgias and myalgias. Negative for back pain.  Skin: Negative for pallor.  Allergic/Immunologic: Negative for immunocompromised state.  Neurological: Positive for numbness.     Physical Exam Updated Vital Signs BP 139/70   Pulse 71   Temp 98.3 F (36.8 C) (Oral)   Resp 16   SpO2 94%   Physical Exam  Constitutional: He is oriented to person, place, and time. He appears well-developed and well-nourished. No distress.  HENT:  Head: Normocephalic and atraumatic.  Neck: Normal range of motion. Neck supple.  Cardiovascular: Normal rate, regular rhythm and normal heart sounds.   No murmur heard. Bounding DP pulses bilaterally.   Pulmonary/Chest: Effort normal and breath sounds normal. No respiratory distress.  Musculoskeletal:  RLE swollen from knee down with no erythema or warmth. Mildly tender to palpation and compartments are soft. Decreased ROM of the ankle. 5/5 muscle strength of the great big toe and sensation of webspace intact. Patient does endorse decreased sensation to the dorsum of the right foot to soft touch and pinprick. No C/T/L spine tenderness.   Neurological: He is alert and oriented to person, place, and time.  Skin: Skin is warm and dry.  Nursing note and vitals reviewed.    ED Treatments / Results  Labs (all labs ordered  are listed, but only abnormal results are displayed) Labs Reviewed - No data to display  EKG  EKG Interpretation None       Radiology Dg Outside Films Extremity  Result Date: 07/29/2016 CLINICAL DATA:  This exam is stored here for comparison purposes only and was performed at an outside facility.   Please contact the originating institution for any associated interpretation or report.    Procedures Procedures (including critical care time)  Medications Ordered in ED Medications  rivaroxaban Alveda Reasons) Education Kit for DVT/PE patients ( Does not apply Given 07/30/16 2122)  Rivaroxaban (XARELTO) tablet 15 mg (15 mg Oral Given 07/30/16 2121)     Initial Impression / Assessment and Plan / ED Course  I have reviewed the triage vital signs and the nursing notes.  Pertinent labs & imaging results that were available during my care of the patient  were reviewed by me and considered in my medical decision making (see chart for details).  Clinical Course   [name] is a 65 y.o. male who presents to ED for worsening RLE swelling. Patient seen in ED yesterday and chart reviewed from this visit. Patient endorses worsening swelling and new onset of numbness along the dorsal aspect of his foot. On exam, he has bounding DP pulse, but does have decreased sensation over the dorsum of the foot. Significant swelling from knee down, but compartments are soft.   Consulted orthopedics and spoke at length with Dr. Doran Durand who recommends outpatient follow up. Compartment syndrome highly unlikely. Patient has full motor and sensory of deep peroneal nerve which is very reassuring and makes compartment syndrome even less likely. Per ortho, numbness should resolve as swelling improves. Discussed ortho ACE wrap / elevation techniques with patient.   DVT ultrasound obtained which shows an acute nonocclusive DVT of the right popliteal vein along with large hematoma near muscle tear. I again spoke with Dr.  Doran Durand of orthopedics and updated him on ultrasound findings. He does recommend placing on anticoagulation- Will start on Xarelto. Patient has cardiologist, but is not seen regularly by a primary care physician. Discussed that he will need to follow-up with orthopedics for muscle injury as well as his cardiologist for DVT. Discussed reasons to return to ER as well as symptomatic home care including compressive dressings. Patient and wife at bedside expressed understanding and agreement with plan as dictated above.  Patient seen by and discussed with Dr. Ellender Hose who agrees with treatment plan.   Final Clinical Impressions(s) / ED Diagnoses   Final diagnoses:  Numbness  Muscle tear  Leg swelling  Acute deep vein thrombosis (DVT) of popliteal vein of right lower extremity [facility])    New Prescriptions Discharge Medication List as of 07/30/2016  9:05 PM    START taking these medications   Details  rivaroxaban Alveda Reasons) KIT Take as directed on kit., Print         [facility], PA-C 07/30/16 2204    Duffy Bruce, MD 07/31/16 1205

## 2016-07-30 NOTE — ED Notes (Signed)
[facility] [place] contacted and coming to get patient.

## 2016-07-30 NOTE — Progress Notes (Signed)
Orthopedic Tech Progress Note Patient Details:  [name] [birth year] [id number]  Ortho Devices Type of Ortho Device: CAM walker, Crutches Ortho Device/Splint Location: rue Ortho Device/Splint Interventions: Ordered, Application   Karolee Stamps 07/30/2016, 12:07 AM

## 2016-07-30 NOTE — ED Notes (Signed)
[name], [place] at bedside at this time.

## 2016-07-30 NOTE — ED Triage Notes (Signed)
The  Pt is c/o  Numbness in his rt foot  He was seen here last pm and was placed in a walking boot.  His  Rt leg is more swollen and he has removed the boot and the numbness is still  there

## 2016-07-30 NOTE — ED Notes (Signed)
Pt transported to US

## 2016-07-30 NOTE — Progress Notes (Signed)
VASCULAR LAB PRELIMINARY  PRELIMINARY  PRELIMINARY  PRELIMINARY  Right lower extremity venous duplex completed.    Preliminary report:  There is an acute, non occlusive DVT noted in the right popliteal vein.  There is a large area of mixed echoes from mid calf extending to proximal calf which may be consistent with a large muscle tear.   Called report to Duffy Bruce, MD  Sharion Dove, RVT 07/30/2016, 7:33 PM

## 2016-07-30 NOTE — Discharge Instructions (Signed)
1. Medications: Tylenol 1000 mg 4 times per day - not exceed 4 g in 24 hours.  Use oxycodone for breakthrough pain. Continue usual home medications 2. Treatment: rest, drink plenty of fluids, elevate your leg as much as possible. Use brace and crutches. 3. Follow Up: Please followup with your orthopedist or Dr. Lyla Glassing in 2 days for discussion of your diagnoses and further evaluation after today's visit; if you do not have a primary care doctor use the resource guide provided to find one; Please return to the ER for worsening pain, numbness of your foot, difficulty breathing, chest pain or other concerns

## 2016-07-30 NOTE — Discharge Instructions (Signed)
Please call the orthopedic listed on Monday morning to schedule a follow-up appointment for discussion of your muscle injury. Call your cardiologist on Monday morning to schedule a follow up appointment for discussion of your DVT. Take blood thinner as directed on kit. Continue to elevate the effected extremity, keeping your toes above your nose at all times. You need to wrap calf to ankle with ACE wrap. Please return to the ER for new or worsening symptoms, any additional concerns.

## 2016-08-01 DIAGNOSIS — T79A21A Traumatic compartment syndrome of right lower extremity, initial encounter: Secondary | ICD-10-CM | POA: Diagnosis not present

## 2016-08-02 ENCOUNTER — Telehealth: Payer: Self-pay | Admitting: Cardiology

## 2016-08-02 ENCOUNTER — Telehealth: Payer: Self-pay | Admitting: Orthopaedic Surgery

## 2016-08-02 NOTE — Telephone Encounter (Signed)
Set up appointment.  I would prefer notes if available.

## 2016-08-02 NOTE — Telephone Encounter (Signed)
Patient inquiring about scheduling appointment with Dr Luna Glasgow following Emergency room visits at [facility], states has seen [facility]opa[facility], and would like Dr Brooke Bonito opinion.

## 2016-08-02 NOTE — Telephone Encounter (Signed)
Patient recently had surgery and put on Xalreto due to blood clot   Was told to contact our office to see if it is ok for him to take

## 2016-08-02 NOTE — Telephone Encounter (Signed)
(  Cont'd) 08/02/16 - patient called to inquire about scheduling appointment with Dr Luna Glasgow following [facility] Emergency Room visit (x2 visits, 07/29/16 and 07/30/16) for problem of "torn muscle in leg"; states referred to, and already seen by [facility].  States "not much was done at first visit"; patient states requested the doctor to schedule an MRI, but said was scheduled for CT scan -- to rule out blood clot.  States the CT was done I relayed that if the notes and imaging are not in the [facility] system, that Dr Luna Glasgow would need this information, due to "second opinion".  States he has also been in touch with his cardiologist at [facility], [employer].  Please advise.

## 2016-08-02 NOTE — Telephone Encounter (Signed)
Fwd to Dr. Branch.  

## 2016-08-03 NOTE — Telephone Encounter (Signed)
I would have to defer to ortho regarding his muscle tear and pain. The blood clot is in a fairly small vein and typically would not cause severe or progressive pain, I think that is more related to the muscle tear.    Zandra Abts MD

## 2016-08-03 NOTE — Telephone Encounter (Signed)
Patient of Dr. Harl Bowie, telephone note sent to me. I reviewed the recent chart. Patient diagnosed with right leg gastrocnemius tear and associated post-traumatic popliteal DVT at recent ER visit. Orthopedics consulted by phone. ER note indicates no high suspicion of compartment syndrome at the time, patient placed on DVT treatment dose Xarelto. He called to our office stating that his right lower leg continues to feel numb and also more painful. If this is the case, patient probably needs be seen again in the ER or by orthopedics to make sure that he is not developing compartment syndrome or need additional imaging of the leg.

## 2016-08-03 NOTE — Telephone Encounter (Signed)
Called back to patient, relayed.  Trying to obtain orthopaedic notes.

## 2016-08-03 NOTE — Telephone Encounter (Signed)
Pt says recent ED visit for torn muscle and DVT leg is getting more painful and remaining numb - pt says is on Xarelto 15 mg - concerned about increasing leg pain - no available slots with NP PA in [place] - will forward to DOD as well as Dr. Harl Bowie

## 2016-08-03 NOTE — Telephone Encounter (Signed)
Pt voiced understanding and explained that Xarelto was for treatment of DVT - and that if pain progression is worsening with numbness to report to ED for further workup - with muscle tear should be deferred to ortho or appt with pcp

## 2016-08-05 DIAGNOSIS — T79A21A Traumatic compartment syndrome of right lower extremity, initial encounter: Secondary | ICD-10-CM | POA: Diagnosis not present

## 2016-08-06 ENCOUNTER — Other Ambulatory Visit: Payer: Self-pay | Admitting: Cardiology

## 2016-08-08 NOTE — Telephone Encounter (Signed)
Notes received.  Called patient to offer appointment; relayed Dr Luna Glasgow is out of office, and next available appointment date is 08/23/16; appointment scheduled, and patient is on wait list if 08/16/16 or 08/17/16 opening becomes available.  Relayed I will send note to practice manager to review patient's notes, as he has concerns in regard to treatment received thus far (initially at [facility] ED and at other orthopaedic clinic, [facility]

## 2016-08-10 DIAGNOSIS — M79604 Pain in right leg: Secondary | ICD-10-CM | POA: Diagnosis not present

## 2016-08-11 ENCOUNTER — Ambulatory Visit (INDEPENDENT_AMBULATORY_CARE_PROVIDER_SITE_OTHER): Payer: PPO | Admitting: Physician Assistant

## 2016-08-11 DIAGNOSIS — I824Z1 Acute embolism and thrombosis of unspecified deep veins of right distal lower extremity: Secondary | ICD-10-CM | POA: Diagnosis not present

## 2016-08-11 DIAGNOSIS — M79661 Pain in right lower leg: Secondary | ICD-10-CM | POA: Diagnosis not present

## 2016-08-11 DIAGNOSIS — Z86718 Personal history of other venous thrombosis and embolism: Secondary | ICD-10-CM | POA: Insufficient documentation

## 2016-08-11 NOTE — Progress Notes (Signed)
Office Visit Note   Patient: [name]           Date of Birth: [birth year]           MRN: [id number] Visit Date: 08/11/2016              Requested by: Redmond School, MD [address] [place], [place] [address] PCP: Glo Herring., MD   Assessment & Plan: Visit Diagnoses: No diagnosis found.  Plan: MRI of the right leg to rule out Achilles tendon tear. Hold Xarelto for now. Elevation wiggling of toes encouraged. Follow with Dr. Erlinda Hong  on Monday. Dr. Erlinda Hong discussed with the patient and his wife is present today the fact that he has a medial head of the gastroc tear and less likely an Achilles tendon tear. However if the MRI shows he has an Achilles tear we will contact him in regards to the definitive treatment for this.  Follow-Up Instructions: Return in about 4 days (around 08/15/2016) for Dr. Erlinda Hong.   Orders:  No orders of the defined types were placed in this encounter.  No orders of the defined types were placed in this encounter.     Procedures: No procedures performed   Clinical Data: No additional findings.   Subjective: No chief complaint on file.   HPI 65 year old male who was pushing a truck on 07/29/2016 when he heard a loud pop. He states that he initially felt that it was something that had fallen out of the vehicle but when he went to take a step and had severe pain in his right calf. He's been evaluated by at least 2 orthopedic surgeons and the ER physician and has a MRI scheduled to rule out Achilles tendon rupture. He had an ultrasound right lower leg which showed a DVT. He was placed on Xarelto was taken off of this yesterday by his primary care physician due to a continued swelling of the right leg. He reports also the ultrasound showed a large hematoma proximal posterior calf region. He is currently on crutches unable bear weight on the right leg. Was given a cam walker boot but is unable to tolerate this.  Review of Systems See  HPI  Objective: Vital Signs: There were no vitals taken for this visit.  Physical Exam  Constitutional: He is oriented to person, place, and time. He appears well-developed and well-nourished.  HENT:  Head: Normocephalic and atraumatic.  Eyes: EOM are normal.  Cardiovascular: Intact distal pulses.   Pulmonary/Chest: Effort normal.  Neurological: He is alert and oriented to person, place, and time.  Skin: Skin is warm and dry.  Psychiatric: He has a normal mood and affect. His behavior is normal.    Ortho Exam Right lower leg with positive edema and ecchymosis in the gastroc  region that tracks to the posterior calcaneus. He has tenderness over the medial head gastroc equivocal tenderness over the Achilles. Thompson's test negative. Slight palpable defect at the muscle tendinous junction of the Achilles. EHL FHL intact. Dorsal pedal pulses 2+. Specialty Comments:  No specialty comments available.  Imaging: No results found.   PMFS History: Patient Active Problem List   Diagnosis Date Noted  . Chest pain 10/08/2014  . Palpitations 10/08/2014  . Bilateral carotid bruits 10/08/2014  . CAD (coronary artery disease) 03/20/2013  . HTN (hypertension) 03/20/2013  . Mixed hyperlipidemia 03/20/2013   Past Medical History:  Diagnosis Date  . Carotid artery disease ([place])    carotid dopplers 05/27/2008, showed moderate left ICA  disease, and disease in left ECA   . Coronary artery disease   . Hyperlipidemia, mixed   . Hypertension   . Myocardial infarction 05/21/2011   anterior wall, treated by Dr. [name] with 3.0x15mm Promus drug eluting stent to proximal LAD   . Peripheral arterial disease ([facility])    moderate left ICA disease, mild right ICA disease  . Prostate cancer ([place]) 2007    Family History  Problem Relation Age of Onset  . Leukemia Mother   . Heart failure Father     CABG at age 81    Past Surgical History:  Procedure Laterality Date  . CARDIAC CATHETERIZATION   05/23/2011, 05/21/2011   05/21/2011   3.0 x 36mm Promus Element drug eluting stent positioned in proximal LAD, stenosis taken from 99% to 0%.  Marland Kitchen HERNIA REPAIR    . PROSTATECTOMY  2007   Social History   Occupational History  . Not on file.   Social History Main Topics  . Smoking status: Former Smoker    Packs/day: 1.00    Years: 7.00    Types: Cigarettes    Start date: 03/05/1970    Quit date: 09/26/1976  . Smokeless tobacco: Never Used  . Alcohol use No  . Drug use: No  . Sexual activity: No

## 2016-08-15 ENCOUNTER — Ambulatory Visit (INDEPENDENT_AMBULATORY_CARE_PROVIDER_SITE_OTHER): Payer: PPO | Admitting: Orthopaedic Surgery

## 2016-08-15 ENCOUNTER — Encounter (INDEPENDENT_AMBULATORY_CARE_PROVIDER_SITE_OTHER): Payer: Self-pay | Admitting: Orthopaedic Surgery

## 2016-08-15 DIAGNOSIS — S96811D Strain of other specified muscles and tendons at ankle and foot level, right foot, subsequent encounter: Secondary | ICD-10-CM | POA: Diagnosis not present

## 2016-08-15 DIAGNOSIS — S8011XD Contusion of right lower leg, subsequent encounter: Secondary | ICD-10-CM | POA: Diagnosis not present

## 2016-08-15 DIAGNOSIS — I824Z1 Acute embolism and thrombosis of unspecified deep veins of right distal lower extremity: Secondary | ICD-10-CM

## 2016-08-15 NOTE — Progress Notes (Signed)
Office Visit Note   Patient: [name]           Date of Birth: Apr 23, 1951           MRN: [id number] Visit Date: 08/15/2016              Requested by: Redmond School, MD [address] [place], [place] [address] PCP: Glo Herring., MD   Assessment & Plan: Visit Diagnoses: No diagnosis found.  Plan:  - MRI reviewed shows plantaris tear and large hematoma, no active bleeding - continue ACE compression with warm compresses and ice, elevation, rest, WBAT in CAM boot - will take 6-8 weeks to fully resolve - patient is not really ambulating and moving ankle much, I told him he needs to restart xarelto now that bleeding risk is minimal - he needs to follow up with PCP within 1-2 weeks for long term management - f/u 2 weeks for recheck  Total face to face encounter time was greater than 25 minutes and over half of this time was spent in counseling and/or coordination of care.   Follow-Up Instructions: Return in about 2 weeks (around 08/29/2016).   Orders:  No orders of the defined types were placed in this encounter.  No orders of the defined types were placed in this encounter.     Procedures: No procedures performed   Clinical Data: No additional findings.   Subjective: Chief Complaint  Patient presents with  . Right Leg - Pain    Follow up visit for MRI review.  Feeling slightly better.  Swelling improved.  Pain improved.    Review of Systems  Constitutional: Negative.   HENT: Negative.   Eyes: Negative.   Respiratory: Negative.   Cardiovascular: Negative.   Gastrointestinal: Negative.   Endocrine: Negative.   Genitourinary: Negative.   Musculoskeletal: Negative.   Skin: Negative.   Allergic/Immunologic: Negative.   Neurological: Negative.   Hematological: Negative.   Psychiatric/Behavioral: Negative.      Objective: Vital Signs: There were no vitals taken for this visit.  Physical Exam  Constitutional: He is oriented to person, place,  and time. He appears well-developed and well-nourished.  HENT:  Head: Normocephalic and atraumatic.  Eyes: EOM are normal.  Neck: Neck supple.  Cardiovascular: Intact distal pulses.   Pulmonary/Chest: Effort normal.  Abdominal: Soft.  Neurological: He is alert and oriented to person, place, and time.  Skin: Skin is warm.  Psychiatric: He has a normal mood and affect. His behavior is normal. Judgment and thought content normal.  Nursing note and vitals reviewed.   Ortho Exam Calf is soft without evidence of compartment syndrome.  Subjective paresthesias likely due to compression of nerves from large hematoma.  Foot wwp, strong pedal pulses.  Calf is ttp.   Specialty Comments:  No specialty comments available.  Imaging: No results found.   PMFS History: Patient Active Problem List   Diagnosis Date Noted  . Right calf pain 08/11/2016  . Right leg DVT ([place]) 08/11/2016  . Chest pain 10/08/2014  . Palpitations 10/08/2014  . Bilateral carotid bruits 10/08/2014  . CAD (coronary artery disease) 03/20/2013  . HTN (hypertension) 03/20/2013  . Mixed hyperlipidemia 03/20/2013   Past Medical History:  Diagnosis Date  . Carotid artery disease ([place])    carotid dopplers 05/27/2008, showed moderate left ICA disease, and disease in left ECA   . Coronary artery disease   . Hyperlipidemia, mixed   . Hypertension   . Myocardial infarction 05/21/2011   anterior wall,  treated by Dr. [name] with 3.0x28mm Promus drug eluting stent to proximal LAD   . Peripheral arterial disease ([facility])    moderate left ICA disease, mild right ICA disease  . Prostate cancer ([place]) 2007    Family History  Problem Relation Age of Onset  . Leukemia Mother   . Heart failure Father     CABG at age 51    Past Surgical History:  Procedure Laterality Date  . CARDIAC CATHETERIZATION  05/23/2011, 05/21/2011   05/21/2011   3.0 x 87mm Promus Element drug eluting stent positioned in proximal LAD, stenosis taken  from 99% to 0%.  Marland Kitchen HERNIA REPAIR    . PROSTATECTOMY  2007   Social History   Occupational History  . Not on file.   Social History Main Topics  . Smoking status: Former Smoker    Packs/day: 1.00    Years: 7.00    Types: Cigarettes    Start date: 03/05/1970    Quit date: 09/26/1976  . Smokeless tobacco: Never Used  . Alcohol use No  . Drug use: No  . Sexual activity: No

## 2016-08-17 ENCOUNTER — Telehealth (INDEPENDENT_AMBULATORY_CARE_PROVIDER_SITE_OTHER): Payer: Self-pay | Admitting: Orthopaedic Surgery

## 2016-08-17 NOTE — Telephone Encounter (Signed)
Pt was given a muscle relaxer from [facility], only has 4 days left. Can they request a refill for this medicine? And is this ok for him still take? Please give them a call as soon as you can.

## 2016-08-17 NOTE — Telephone Encounter (Signed)
Yes approve

## 2016-08-17 NOTE — Telephone Encounter (Signed)
Please advise 

## 2016-08-23 ENCOUNTER — Ambulatory Visit: Payer: PPO | Admitting: Orthopaedic Surgery

## 2016-08-24 NOTE — Telephone Encounter (Signed)
Called pt to see which muscle relaxer it was for, no answer [facility]

## 2016-08-25 NOTE — Telephone Encounter (Signed)
appt has been made 

## 2016-08-29 ENCOUNTER — Encounter (INDEPENDENT_AMBULATORY_CARE_PROVIDER_SITE_OTHER): Payer: Self-pay | Admitting: Orthopaedic Surgery

## 2016-08-29 ENCOUNTER — Ambulatory Visit (INDEPENDENT_AMBULATORY_CARE_PROVIDER_SITE_OTHER): Payer: PPO | Admitting: Orthopaedic Surgery

## 2016-08-29 DIAGNOSIS — S8011XD Contusion of right lower leg, subsequent encounter: Secondary | ICD-10-CM

## 2016-08-29 DIAGNOSIS — S96811D Strain of other specified muscles and tendons at ankle and foot level, right foot, subsequent encounter: Secondary | ICD-10-CM | POA: Diagnosis not present

## 2016-08-29 NOTE — Progress Notes (Signed)
Office Visit Note   Patient: [name]           Date of Birth: 1951-06-18           MRN: [id number] Visit Date: 08/29/2016              Requested by: Redmond School, MD [address] [place], [place] [address] PCP: Glo Herring., MD   Assessment & Plan: Visit Diagnoses:  1. Rupture of plantaris tendon, right, subsequent encounter   2. Hematoma of leg, right, subsequent encounter     Plan: Continue with Ace compression wrap. Begin physical therapy for rehabilitation. Follow-up with me as needed. We did discuss the expected healing time and recovery that's involved and understands  Follow-Up Instructions: Return if symptoms worsen or fail to improve.   Orders:  No orders of the defined types were placed in this encounter.  No orders of the defined types were placed in this encounter.     Procedures: No procedures performed   Clinical Data: No additional findings.   Subjective: Chief Complaint  Patient presents with  . Right Leg - Pain    Patient follows up today for his plantaris rupture and hematoma. He is improving. He still has some numbness in his foot. He has resumed his anticoagulation. The swelling is improving.    Review of Systems   Objective: Vital Signs: There were no vitals taken for this visit.  Physical Exam  Ortho Exam Exam of the right calf shows a soft right calf. He is doing slightly better. This is less tender with palpation. He does not exhibit any signs of worsening. Specialty Comments:  No specialty comments available.  Imaging: No results found.   PMFS History: Patient Active Problem List   Diagnosis Date Noted  . Rupture of plantaris tendon, right, subsequent encounter 08/15/2016  . Hematoma of leg, right, subsequent encounter 08/15/2016  . Right calf pain 08/11/2016  . Right leg DVT ([place]) 08/11/2016  . Chest pain 10/08/2014  . Palpitations 10/08/2014  . Bilateral carotid bruits 10/08/2014  . CAD (coronary  artery disease) 03/20/2013  . HTN (hypertension) 03/20/2013  . Mixed hyperlipidemia 03/20/2013   Past Medical History:  Diagnosis Date  . Carotid artery disease ([place])    carotid dopplers 05/27/2008, showed moderate left ICA disease, and disease in left ECA   . Coronary artery disease   . Hyperlipidemia, mixed   . Hypertension   . Myocardial infarction 05/21/2011   anterior wall, treated by Dr. [name] with 3.0x59mm Promus drug eluting stent to proximal LAD   . Peripheral arterial disease ([facility])    moderate left ICA disease, mild right ICA disease  . Prostate cancer ([place]) 2007    Family History  Problem Relation Age of Onset  . Leukemia Mother   . Heart failure Father     CABG at age 38    Past Surgical History:  Procedure Laterality Date  . CARDIAC CATHETERIZATION  05/23/2011, 05/21/2011   05/21/2011   3.0 x 15mm Promus Element drug eluting stent positioned in proximal LAD, stenosis taken from 99% to 0%.  Marland Kitchen HERNIA REPAIR    . PROSTATECTOMY  2007   Social History   Occupational History  . Not on file.   Social History Main Topics  . Smoking status: Former Smoker    Packs/day: 1.00    Years: 7.00    Types: Cigarettes    Start date: 03/05/1970    Quit date: 09/26/1976  . Smokeless tobacco: Never  Used  . Alcohol use No  . Drug use: No  . Sexual activity: No

## 2016-08-29 NOTE — Progress Notes (Deleted)
   Office Visit Note   Patient: [name]           Date of Birth: 06/05/1951           MRN: [id number] Visit Date: 08/29/2016              Requested by: Redmond School, MD [address] [place], [place] [address] PCP: Glo Herring., MD   Assessment & Plan: Visit Diagnoses: No diagnosis found.  Plan: ***  Follow-Up Instructions: No Follow-up on file.   Orders:  No orders of the defined types were placed in this encounter.  No orders of the defined types were placed in this encounter.     Procedures: No procedures performed   Clinical Data: No additional findings.   Subjective: Chief Complaint  Patient presents with  . Right Leg - Pain    HPI  Review of Systems   Objective: Vital Signs: There were no vitals taken for this visit.  Physical Exam  Ortho Exam  Specialty Comments:  No specialty comments available.  Imaging: No results found.   PMFS History: Patient Active Problem List   Diagnosis Date Noted  . Rupture of plantaris tendon, right, subsequent encounter 08/15/2016  . Hematoma of leg, right, subsequent encounter 08/15/2016  . Right calf pain 08/11/2016  . Right leg DVT ([place]) 08/11/2016  . Chest pain 10/08/2014  . Palpitations 10/08/2014  . Bilateral carotid bruits 10/08/2014  . CAD (coronary artery disease) 03/20/2013  . HTN (hypertension) 03/20/2013  . Mixed hyperlipidemia 03/20/2013   Past Medical History:  Diagnosis Date  . Carotid artery disease ([place])    carotid dopplers 05/27/2008, showed moderate left ICA disease, and disease in left ECA   . Coronary artery disease   . Hyperlipidemia, mixed   . Hypertension   . Myocardial infarction 05/21/2011   anterior wall, treated by Dr. [name] with 3.0x20mm Promus drug eluting stent to proximal LAD   . Peripheral arterial disease ([facility])    moderate left ICA disease, mild right ICA disease  . Prostate cancer ([place]) 2007    Family History  Problem Relation Age of  Onset  . Leukemia Mother   . Heart failure Father     CABG at age 53    Past Surgical History:  Procedure Laterality Date  . CARDIAC CATHETERIZATION  05/23/2011, 05/21/2011   05/21/2011   3.0 x 24mm Promus Element drug eluting stent positioned in proximal LAD, stenosis taken from 99% to 0%.  Marland Kitchen HERNIA REPAIR    . PROSTATECTOMY  2007   Social History   Occupational History  . Not on file.   Social History Main Topics  . Smoking status: Former Smoker    Packs/day: 1.00    Years: 7.00    Types: Cigarettes    Start date: 03/05/1970    Quit date: 09/26/1976  . Smokeless tobacco: Never Used  . Alcohol use No  . Drug use: No  . Sexual activity: No

## 2016-11-25 DIAGNOSIS — I10 Essential (primary) hypertension: Secondary | ICD-10-CM | POA: Diagnosis not present

## 2016-11-25 DIAGNOSIS — Z7901 Long term (current) use of anticoagulants: Secondary | ICD-10-CM | POA: Diagnosis not present

## 2016-11-25 DIAGNOSIS — S8011XD Contusion of right lower leg, subsequent encounter: Secondary | ICD-10-CM | POA: Diagnosis not present

## 2016-12-07 ENCOUNTER — Other Ambulatory Visit (HOSPITAL_COMMUNITY): Payer: Self-pay | Admitting: Family Medicine

## 2016-12-07 ENCOUNTER — Ambulatory Visit (HOSPITAL_COMMUNITY)
Admission: RE | Admit: 2016-12-07 | Discharge: 2016-12-07 | Disposition: A | Discharge status: Home / Self Care | Payer: PPO | Source: Ambulatory Visit | Attending: Family Medicine | Admitting: Family Medicine

## 2016-12-07 DIAGNOSIS — R310 Gross hematuria: Secondary | ICD-10-CM | POA: Insufficient documentation

## 2016-12-07 DIAGNOSIS — N390 Urinary tract infection, site not specified: Secondary | ICD-10-CM | POA: Diagnosis not present

## 2016-12-07 DIAGNOSIS — Z8546 Personal history of malignant neoplasm of prostate: Secondary | ICD-10-CM | POA: Insufficient documentation

## 2016-12-07 DIAGNOSIS — Z7901 Long term (current) use of anticoagulants: Secondary | ICD-10-CM | POA: Diagnosis not present

## 2016-12-07 DIAGNOSIS — E663 Overweight: Secondary | ICD-10-CM | POA: Diagnosis not present

## 2016-12-07 DIAGNOSIS — N5231 Erectile dysfunction following radical prostatectomy: Secondary | ICD-10-CM | POA: Diagnosis not present

## 2017-01-07 ENCOUNTER — Other Ambulatory Visit: Payer: Self-pay | Admitting: Cardiology

## 2017-02-21 DIAGNOSIS — R319 Hematuria, unspecified: Secondary | ICD-10-CM | POA: Diagnosis not present

## 2017-02-21 DIAGNOSIS — S8011XD Contusion of right lower leg, subsequent encounter: Secondary | ICD-10-CM | POA: Diagnosis not present

## 2017-02-21 DIAGNOSIS — Z86718 Personal history of other venous thrombosis and embolism: Secondary | ICD-10-CM | POA: Diagnosis not present

## 2017-02-21 DIAGNOSIS — I2510 Atherosclerotic heart disease of native coronary artery without angina pectoris: Secondary | ICD-10-CM | POA: Diagnosis not present

## 2017-02-21 DIAGNOSIS — I10 Essential (primary) hypertension: Secondary | ICD-10-CM | POA: Diagnosis not present

## 2017-03-03 DIAGNOSIS — H25012 Cortical age-related cataract, left eye: Secondary | ICD-10-CM | POA: Diagnosis not present

## 2017-03-03 DIAGNOSIS — H2513 Age-related nuclear cataract, bilateral: Secondary | ICD-10-CM | POA: Diagnosis not present

## 2017-03-03 DIAGNOSIS — H43813 Vitreous degeneration, bilateral: Secondary | ICD-10-CM | POA: Diagnosis not present

## 2017-03-03 DIAGNOSIS — H524 Presbyopia: Secondary | ICD-10-CM | POA: Diagnosis not present

## 2017-03-04 ENCOUNTER — Other Ambulatory Visit: Payer: Self-pay | Admitting: Cardiology

## 2017-04-08 ENCOUNTER — Other Ambulatory Visit: Payer: Self-pay | Admitting: Cardiology

## 2017-05-19 ENCOUNTER — Ambulatory Visit (INDEPENDENT_AMBULATORY_CARE_PROVIDER_SITE_OTHER): Payer: PPO | Admitting: Cardiology

## 2017-05-19 ENCOUNTER — Encounter: Payer: Self-pay | Admitting: *Deleted

## 2017-05-19 ENCOUNTER — Telehealth: Payer: Self-pay | Admitting: Cardiology

## 2017-05-19 ENCOUNTER — Encounter: Payer: Self-pay | Admitting: Cardiology

## 2017-05-19 VITALS — BP 136/86 | HR 61 | Ht 68.0 in | Wt 183.4 lb

## 2017-05-19 DIAGNOSIS — I10 Essential (primary) hypertension: Secondary | ICD-10-CM

## 2017-05-19 DIAGNOSIS — R002 Palpitations: Secondary | ICD-10-CM

## 2017-05-19 DIAGNOSIS — R079 Chest pain, unspecified: Secondary | ICD-10-CM | POA: Diagnosis not present

## 2017-05-19 DIAGNOSIS — E782 Mixed hyperlipidemia: Secondary | ICD-10-CM

## 2017-05-19 DIAGNOSIS — I2510 Atherosclerotic heart disease of native coronary artery without angina pectoris: Secondary | ICD-10-CM

## 2017-05-19 NOTE — Patient Instructions (Signed)
Your physician recommends that you schedule a follow-up appointment in: PENDING TESTING WITH DR [facility]  Your physician recommends that you continue on your current medications as directed. Please refer to the Current Medication list given to you today.  Your physician has requested that you have en exercise stress myoview. For further information please visit [email or web address]. Please follow instruction sheet, as given.  Thank you for choosing [facility]!!

## 2017-05-19 NOTE — Telephone Encounter (Signed)
Pre-cert Verification for the following procedure   EXERCISE NUC STRESS TEST DX CHEST PAIN  Set for 05-26-17

## 2017-05-19 NOTE — Progress Notes (Signed)
Clinical Summary [name] is a 66 y.o.male seen today for follow up of the following medical problems.   1. CAD - hx of prior anterior STEMI 04/2011, s/p DES to LAD. Repeat cath 04/2011 with patent stent and overall patent vessels.  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 Documented only 77% of THR, however discussed with technicians and they state he reached target heart rate.     - recent chest pain. Episode last Thursday. Episode occurred while digging in his yard. Felt hot all over. Went inside and sat down. Later drove to [facility] and had chest pain. Sharp/pressure throughout entire chest, 6/10 in severity. Mild SOB. Worst with deep breaths. Pain lasted 2 hours constant. Took ASA, checked bp 90/60 and pulse 91. Wife told him he looked pale. Took NG, after 10 minutes pain subsided - walks 4.5 miles regularly without troubles. Does 30 min ellipitical machine 40 push ups, upper body weights. Has layed off exercises since that time.    2. HTN - compliant with meds - bp's remain low at time.    3. Hyperlipidemia - intolerant to statins, has been tried on multiple.  - he has not been interested in alternative agents.   - 04/2016 TC 194 TG 103 HDL 40 LDL 133  4. Palpitations - 48 hr montior showed NSR with frequent PVCs.TSH 3.114 - since cutting back on caffeine symptoms have resolved.   - denies any recent symptoms     5. DVT - now off xarelto, had been on x 5 months - 07/2016 venous US - he reports stopped xarelto due to hematuria Past Medical History:  Diagnosis Date  . Carotid artery disease ([place])    carotid dopplers 05/27/2008, showed moderate left ICA disease, and disease in left ECA   . Coronary artery disease   . Hyperlipidemia, mixed   . Hypertension   . Myocardial infarction 05/21/2011   anterior wall, treated by Dr. [name] with 3.0x82m Promus  drug eluting stent to proximal LAD   . Peripheral arterial disease ([facility])    moderate left ICA disease, mild right ICA disease  . [place]     Allergies  Allergen Reactions  . Adhesive [Tape] Hives    Hives  And itching from the adhesive on the pads of the heart monitor   . Demerol [Meperidine] Nausea Only    Drop in blood pressure  Muscle spasm  . Fentanyl Nausea Only    Drop in blood pressure Muscle spasm     Current Outpatient Prescriptions  Medication Sig Dispense Refill  . acetaminophen (TYLENOL) 500 MG tablet Take 1,000 mg by mouth every 6 (six) hours as needed for mild pain.    .Marland KitchenamLODipine (NORVASC) 2.5 MG tablet TAKE ONE TABLET BY MOUTH ONCE DAILY. 30 tablet 1  . aspirin 81 MG tablet Take 162 mg by mouth daily.     . Coenzyme Q10 (CO Q-10) 100 MG CAPS Take 1 capsule by mouth daily.    .Marland Kitchenlosartan (COZAAR) 25 MG tablet TAKE ONE TABLET BY MOUTH ONCE DAILY. 30 tablet 1  . magnesium 30 MG tablet Take 30 mg by mouth at bedtime as needed (sleep).    . metoprolol tartrate (LOPRESSOR) 25 MG tablet TAKE (2) TABLETS BY MOUTH IN THE MORNING AND (1) TABLET AT NIGHT. 180 tablet 1  . morphine (MSIR) 15 MG tablet     .  NITROSTAT 0.4 MG SL tablet PLACE 1 UNDER TONGUE EVERY 5 MINUTES UP TO 3 DOSES AS NEEDED FOR CHEST PAIN. 25 tablet 3  . oxyCODONE (ROXICODONE) 5 MG immediate release tablet Take 1-2 tablets (5-10 mg total) by mouth every 6 (six) hours as needed for severe pain. (Patient not taking: Reported on 08/29/2016) 21 tablet 0  . rivaroxaban (XARELTO) KIT Take as directed on kit. 1 kit 0   No current facility-administered medications for this visit.      Past Surgical History:  Procedure Laterality Date  . CARDIAC CATHETERIZATION  05/23/2011, 05/21/2011   05/21/2011   3.0 x 45m Promus Element drug eluting stent positioned in proximal LAD, stenosis taken from 99% to 0%.  .Marland KitchenHERNIA REPAIR    . PROSTATECTOMY  2007     Allergies  Allergen Reactions  .  Adhesive [Tape] Hives    Hives  And itching from the adhesive on the pads of the heart monitor   . Demerol [Meperidine] Nausea Only    Drop in blood pressure  Muscle spasm  . Fentanyl Nausea Only    Drop in blood pressure Muscle spasm      Family History  Problem Relation Age of Onset  . Leukemia Mother   . Heart failure Father        CABG at age 67    Social History [name] reports that he quit smoking about 40 years ago. His smoking use included Cigarettes. He started smoking about 47 years ago. He has a 7.00 pack-year smoking history. He has never used smokeless tobacco. Mr. LRemediosreports that he does not drink alcohol.   Review of Systems CONSTITUTIONAL: No weight loss, fever, chills, weakness or fatigue.  HEENT: Eyes: No visual loss, blurred vision, double vision or yellow sclerae.No hearing loss, sneezing, congestion, runny nose or sore throat.  SKIN: No rash or itching.  CARDIOVASCULAR: per hpi RESPIRATORY: per hpi GASTROINTESTINAL: No anorexia, nausea, vomiting or diarrhea. No abdominal pain or blood.  GENITOURINARY: No burning on urination, no polyuria NEUROLOGICAL: No headache, dizziness, syncope, paralysis, ataxia, numbness or tingling in the extremities. No change in bowel or bladder control.  MUSCULOSKELETAL: No muscle, back pain, joint pain or stiffness.  LYMPHATICS: No enlarged nodes. No history of splenectomy.  PSYCHIATRIC: No history of depression or anxiety.  ENDOCRINOLOGIC: No reports of sweating, cold or heat intolerance. No polyuria or polydipsia.  .Marland Kitchen  Physical Examination Vitals:   05/19/17 0910  BP: 136/86  Pulse: 61  SpO2: 94%   Vitals:   05/19/17 0910  Weight: 183 lb 6.4 oz (83.2 kg)  Height: '5\' 8"'  (1.727 m)    Gen: resting comfortably, no acute distress HEENT: no scleral icterus, pupils equal round and reactive, no palptable cervical adenopathy,  CV: RRR, no m/r/g, no jvd Resp: Clear to auscultation bilaterally GI: abdomen is  soft, non-tender, non-distended, normal bowel sounds, no hepatosplenomegaly MSK: extremities are warm, no edema.  Skin: warm, no rash Neuro:  no focal deficits Psych: appropriate affect   Diagnostic Studies 04/2011 Cath HEMODYNAMIC FINDINGS: Central aortic pressure 109/68. Left ventricular  pressure 105/0/15.  ANGIOGRAPHIC FINDINGS:  1. The left main artery had no disease.  2. The left anterior descending was a large vessel that coursed to the  apex and gave off 5 small-caliber diagonal branches. Proximal  vessel has a 99% subtotal occlusion. The midvessel had two areas  of tubular 40% stenosis.  3. The circumflex artery had mild plaque disease.  4. The right  coronary artery was a large dominant vessel with mild  plaque disease in the proximal mid vessel.  5. Left ventricular angiogram was performed in the RAO projection and  showed severe left ventricular systolic dysfunction with ejection  fraction of 30%. The anterior wall had severe hypokinesis.  IMPRESSION:  1. Anterior ST-elevation myocardial infarction.  2. Subtotal occlusion of the proximal left anterior descending artery.  3. Moderate left ventricular systolic dysfunction.  4. Successful percutaneous transluminal coronary angioplasty with  placement of a drug-eluting stent in the proximal left anterior  descending x1.  10/2014 Exercise MPI IMPRESSION: 1. No reversible ischemia or infarction at the level of stress achieved, patient did not reach 85% of target heart rate.  2. Normal left ventricular wall motion.  3. Left ventricular ejection fraction 52%  4. Low-risk stress test findings*. Please note test sensitivity is decreased, patient did not reach target heart rate.   10/2014 Carotid US  IMPRESSION: 1. Interval reduction in left-sided atherosclerotic plaque burden, though residual plaque results in persistently borderline elevated peak systolic velocities within the left internal  carotid artery compatible with the lower end of the 50-69% luminal narrowing range. Further evaluation could be performed with CTA as clinically indicated. 2. Interval reduction in right-sided atherosclerotic plaque burden, with residual plaque not resulting in a hemodynamically significant stenosis.      Assessment and Plan   1. CAD -recent chest pain symptoms - we will obtain an exercise nuclear stress test  2. HTN - low bp's at times, we will change lopressor to 12.67m in AM and 290min PM  4. Hyperlipidemia - intolerant to statins, conintue dietary modification. He has not been interested in alternative agents - 04/2015 TC 182 TG 136 HDL 38 LDL 117  5. Carotid stenosis - mild bilateral disease by [place] will continue to monitor.   6. Palpitations - monitor shows frequent PVCs - symptoms resolved with decrease in caffeine and beta blocker  - continue beta blocker, we will continue to monitor.   F/u pending test results      JoArnoldo LenisM.D.

## 2017-05-26 ENCOUNTER — Encounter (HOSPITAL_COMMUNITY)
Admission: RE | Admit: 2017-05-26 | Discharge: 2017-05-26 | Disposition: A | Discharge status: Home / Self Care | Payer: PPO | Source: Ambulatory Visit | Attending: Cardiology | Admitting: Cardiology

## 2017-05-26 ENCOUNTER — Encounter (HOSPITAL_COMMUNITY): Payer: Self-pay

## 2017-05-26 ENCOUNTER — Encounter (HOSPITAL_BASED_OUTPATIENT_CLINIC_OR_DEPARTMENT_OTHER)
Admission: RE | Admit: 2017-05-26 | Discharge: 2017-05-26 | Disposition: A | Discharge status: Home / Self Care | Payer: PPO | Source: Ambulatory Visit | Attending: Cardiology | Admitting: Cardiology

## 2017-05-26 DIAGNOSIS — R079 Chest pain, unspecified: Secondary | ICD-10-CM | POA: Insufficient documentation

## 2017-05-26 HISTORY — DX: Unspecified asthma, uncomplicated: J45.909

## 2017-05-26 LAB — NM MYOCAR MULTI W/SPECT W/WALL MOTION / EF
CHL CUP RESTING HR STRESS: 55 {beats}/min
CHL RATE OF PERCEIVED EXERTION: 17
CSEPEDS: 58 s
Estimated workload: 13.7 METS
Exercise duration (min): 9 min
LVDIAVOL: 90 mL (ref 62–150)
LVSYSVOL: 43 mL
MPHR: 153 {beats}/min
Peak HR: 133 {beats}/min
Percent HR: 86 %
RATE: 0.38
SDS: 2
SRS: 2
SSS: 4
TID: 0.98

## 2017-05-26 MED ORDER — TECHNETIUM TC 99M TETROFOSMIN IV KIT
30.0000 | PACK | Freq: Once | INTRAVENOUS | Status: AC | PRN
  Administered 2017-05-26: 30 via INTRAVENOUS

## 2017-05-26 MED ORDER — SODIUM CHLORIDE 0.9% FLUSH
INTRAVENOUS | Status: AC
  Filled 2017-05-26: qty 160

## 2017-05-26 MED ORDER — REGADENOSON 0.4 MG/5ML IV SOLN
INTRAVENOUS | Status: AC
  Filled 2017-05-26: qty 5

## 2017-05-26 MED ORDER — TECHNETIUM TC 99M TETROFOSMIN IV KIT
10.0000 | PACK | Freq: Once | INTRAVENOUS | Status: AC | PRN
  Administered 2017-05-26: 10.9 via INTRAVENOUS

## 2017-05-26 MED ORDER — SODIUM CHLORIDE 0.9% FLUSH
INTRAVENOUS | Status: AC
  Administered 2017-05-26: 10 mL via INTRAVENOUS
  Filled 2017-05-26: qty 10

## 2017-06-01 ENCOUNTER — Telehealth: Payer: Self-pay

## 2017-06-01 NOTE — Telephone Encounter (Signed)
-----   Message from Arnoldo Lenis, MD sent at 06/01/2017  3:29 PM EDT ----- Stress test overall looks good, the vast majority of the heart is getting plenty of blood. There are 2 very small areas which could represent blockages, these are not enough to be of signifcant risk but can cause some pain at times. How are his symptoms doing  Zandra Abts MD

## 2017-06-01 NOTE — Telephone Encounter (Signed)
Spoke with pt., went over test results. He voiced understanding, and stated that he is not having any symptoms at this time. He will follow up if he starts having any further problems.

## 2017-06-05 NOTE — Telephone Encounter (Signed)
Called pt., no answer. I left message for pt to return call.

## 2017-06-05 NOTE — Telephone Encounter (Signed)
If he does not have already should f/u within 3 months  Zandra Abts MD

## 2017-06-17 ENCOUNTER — Other Ambulatory Visit: Payer: Self-pay | Admitting: Cardiology

## 2017-07-15 ENCOUNTER — Other Ambulatory Visit: Payer: Self-pay | Admitting: Cardiology

## 2017-08-30 ENCOUNTER — Encounter: Payer: Self-pay | Admitting: Cardiology

## 2017-08-30 ENCOUNTER — Other Ambulatory Visit: Payer: Self-pay

## 2017-08-30 ENCOUNTER — Ambulatory Visit: Payer: PPO | Admitting: Cardiology

## 2017-08-30 VITALS — BP 148/78 | HR 50 | Ht 66.0 in | Wt 183.0 lb

## 2017-08-30 DIAGNOSIS — E782 Mixed hyperlipidemia: Secondary | ICD-10-CM

## 2017-08-30 DIAGNOSIS — R002 Palpitations: Secondary | ICD-10-CM | POA: Diagnosis not present

## 2017-08-30 DIAGNOSIS — I25118 Atherosclerotic heart disease of native coronary artery with other forms of angina pectoris: Secondary | ICD-10-CM | POA: Diagnosis not present

## 2017-08-30 DIAGNOSIS — I6523 Occlusion and stenosis of bilateral carotid arteries: Secondary | ICD-10-CM | POA: Diagnosis not present

## 2017-08-30 DIAGNOSIS — I10 Essential (primary) hypertension: Secondary | ICD-10-CM

## 2017-08-30 MED ORDER — METOPROLOL TARTRATE 25 MG PO TABS: 25.0000 mg | ORAL_TABLET | Freq: Two times a day (BID) | ORAL | 1 refills | Status: DC

## 2017-08-30 NOTE — Patient Instructions (Signed)
Your physician wants you to follow-up in: [place] will receive a reminder letter in the mail two months in advance. If you don't receive a letter, please call our office to schedule the follow-up appointment.  Your physician has recommended you make the following change in your medication:   CHANGE LOPRESSOR (METOPROLOL) 25 MG TWICE DAILY   Thank you for choosing [facility]!!

## 2017-08-30 NOTE — Progress Notes (Signed)
Clinical Summary Mr. Curnow is a 66 y.o.male seen today for follow up of the following medical problems.   1. CAD - hx of prior anterior STEMI 04/2011, s/p DES to LAD. Repeat cath 04/2011 with patent stent and overall patent vessels.  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 Documented only 77% of THR, however discussed with technicians and they state he reached target heart rate.     - recent chest pain. Episode last Thursday. Episode occurred while digging in his yard. Felt hot all over. Went inside and sat down. Later drove to [facility] and had chest pain. Sharp/pressure throughout entire chest, 6/10 in severity. Mild SOB. Worst with deep breaths. Pain lasted 2 hours constant. Took ASA, checked bp 90/60 and pulse 91. Wife told him he looked pale. Took NG, after 10 minutes pain subsided - walks 4.5 miles regularly without troubles. Does 30 min ellipitical machine 40 push ups, upper body weights. Has layed off exercises since that time.   - 04/2017 exercise nuclear stress: low risk Duke score of 10. Small basal inferolateral infarct with mild ischemia. Overall low risk - no recent chest pain. Can have some SOB at times, example walking up stairs. - exercises daily. 30 minutes on elliptical machine, 40 sit ups, mild to moderate weights. Tolerates well without troubles.  - compliant with meds  2. HTN - compliant with meds - bp's remain low at time.   - home bp's 115-130s/50-60s - some lightheadedness daily. Heart rates can be low 50s at home, has some fatigue.   3. Hyperlipidemia - intolerant to statins, has been tried on multiple.  - he has not been interested in alternative agents.   - 11/2016 TC 192 HDL 37 TG 135 LDL 130    4. Palpitations - 48 hr montior showed NSR with frequent PVCs.TSH 3.114 - since cutting back on caffeine symptoms have resolved.   - denies any  recent symptoms     5. DVT - now off xarelto, had been on x 5 months - 07/2016 venous US - he reports stopped xarelto due to hematuria   Past Medical History:  Diagnosis Date  . Asthma    Childhood  . Carotid artery disease ([place])    carotid dopplers 05/27/2008, showed moderate left ICA disease, and disease in left ECA   . Coronary artery disease   . Hyperlipidemia, mixed   . Hypertension   . Myocardial infarction ([place]) 05/21/2011   anterior wall, treated by Dr. [name] with 3.0x15m Promus drug eluting stent to proximal LAD   . Peripheral arterial disease ([facility])    moderate left ICA disease, mild right ICA disease  . [place]     Allergies  Allergen Reactions  . Adhesive [Tape] Hives    Hives  And itching from the adhesive on the pads of the heart monitor   . Demerol [Meperidine] Nausea Only    Drop in blood pressure  Muscle spasm  . Fentanyl Nausea Only    Drop in blood pressure Muscle spasm     Current Outpatient Medications  Medication Sig Dispense Refill  . acetaminophen (TYLENOL) 500 MG tablet Take 1,000 mg by mouth every 6 (six) hours as needed for mild pain.    .Marland KitchenamLODipine (NORVASC) 2.5 MG tablet TAKE (1) TABLET BY MOUTH ONCE DAILY. 30 tablet 3  . aspirin 81 MG tablet Take  162 mg by mouth daily.     . Coenzyme Q10 (CO Q-10) 100 MG CAPS Take 1 capsule by mouth daily.    Marland Kitchen losartan (COZAAR) 25 MG tablet TAKE ONE TABLET BY MOUTH ONCE DAILY. 30 tablet 3  . metoprolol tartrate (LOPRESSOR) 25 MG tablet TAKE (2) TABLETS BY MOUTH IN THE MORNING AND (1) TABLET AT NIGHT. 180 tablet 0  . NITROSTAT 0.4 MG SL tablet PLACE 1 UNDER TONGUE EVERY 5 MINUTES UP TO 3 DOSES AS NEEDED FOR CHEST PAIN. 25 tablet 3  . rivaroxaban (XARELTO) KIT Take as directed on kit. (Patient not taking: Reported on 05/19/2017) 1 kit 0   No current facility-administered medications for this visit.      Past Surgical History:  Procedure Laterality Date  . CARDIAC  CATHETERIZATION  05/23/2011, 05/21/2011   05/21/2011   3.0 x 61m Promus Element drug eluting stent positioned in proximal LAD, stenosis taken from 99% to 0%.  .Marland KitchenHERNIA REPAIR    . PROSTATECTOMY  2007     Allergies  Allergen Reactions  . Adhesive [Tape] Hives    Hives  And itching from the adhesive on the pads of the heart monitor   . Demerol [Meperidine] Nausea Only    Drop in blood pressure  Muscle spasm  . Fentanyl Nausea Only    Drop in blood pressure Muscle spasm      Family History  Problem Relation Age of Onset  . Leukemia Mother   . Heart failure Father        CABG at age 67    Social History Mr. Graveline reports that he quit smoking about 40 years ago. His smoking use included cigarettes. He started smoking about 47 years ago. He has a 7.00 pack-year smoking history. he has never used smokeless tobacco. Mr. LCicalesereports that he does not drink alcohol.   Review of Systems CONSTITUTIONAL: No weight loss, fever, chills, weakness or fatigue.  HEENT: Eyes: No visual loss, blurred vision, double vision or yellow sclerae.No hearing loss, sneezing, congestion, runny nose or sore throat.  SKIN: No rash or itching.  CARDIOVASCULAR: per hpi RESPIRATORY: per hpi GASTROINTESTINAL: No anorexia, nausea, vomiting or diarrhea. No abdominal pain or blood.  GENITOURINARY: No burning on urination, no polyuria NEUROLOGICAL: No headache, dizziness, syncope, paralysis, ataxia, numbness or tingling in the extremities. No change in bowel or bladder control.  MUSCULOSKELETAL: No muscle, back pain, joint pain or stiffness.  LYMPHATICS: No enlarged nodes. No history of splenectomy.  PSYCHIATRIC: No history of depression or anxiety.  ENDOCRINOLOGIC: No reports of sweating, cold or heat intolerance. No polyuria or polydipsia.  .Marland Kitchen  Physical Examination Vitals:   08/30/17 0855  BP: (!) 148/78  Pulse: (!) 50  SpO2: 94%   Vitals:   08/30/17 0855  Weight: 183 lb (83 kg)  Height: 5'  6" (1.676 m)    Gen: resting comfortably, no acute distress HEENT: no scleral icterus, pupils equal round and reactive, no palptable cervical adenopathy,  CV: RRR, no m/r/g, no jvd Resp: Clear to auscultation bilaterally GI: abdomen is soft, non-tender, non-distended, normal bowel sounds, no hepatosplenomegaly MSK: extremities are warm, no edema.  Skin: warm, no rash Neuro:  no focal deficits Psych: appropriate affect   Diagnostic Studies 04/2011 Cath HEMODYNAMIC FINDINGS: Central aortic pressure 109/68. Left ventricular  pressure 105/0/15.  ANGIOGRAPHIC FINDINGS:  1. The left main artery had no disease.  2. The left anterior descending was a large vessel that coursed to the  apex and gave off 5 small-caliber diagonal branches. Proximal  vessel has a 99% subtotal occlusion. The midvessel had two areas  of tubular 40% stenosis.  3. The circumflex artery had mild plaque disease.  4. The right coronary artery was a large dominant vessel with mild  plaque disease in the proximal mid vessel.  5. Left ventricular angiogram was performed in the RAO projection and  showed severe left ventricular systolic dysfunction with ejection  fraction of 30%. The anterior wall had severe hypokinesis.  IMPRESSION:  1. Anterior ST-elevation myocardial infarction.  2. Subtotal occlusion of the proximal left anterior descending artery.  3. Moderate left ventricular systolic dysfunction.  4. Successful percutaneous transluminal coronary angioplasty with  placement of a drug-eluting stent in the proximal left anterior  descending x1.  10/2014 Exercise MPI IMPRESSION: 1. No reversible ischemia or infarction at the level of stress achieved, patient did not reach 85% of target heart rate.  2. Normal left ventricular wall motion.  3. Left ventricular ejection fraction 52%  4. Low-risk stress test findings*. Please note test sensitivity is decreased, patient did not reach  target heart rate.   10/2014 Carotid US  IMPRESSION: 1. Interval reduction in left-sided atherosclerotic plaque burden, though residual plaque results in persistently borderline elevated peak systolic velocities within the left internal carotid artery compatible with the lower end of the 50-69% luminal narrowing range. Further evaluation could be performed with CTA as clinically indicated. 2. Interval reduction in right-sided atherosclerotic plaque burden, with residual plaque not resulting in a hemodynamically significant stenosis.    04/2017 nuclear stress  No diagnostic ST segment changes by standard criteria. Occasional to frequent PVCs noted in recovery, no sustained arrhythmias. Low risk Duke treadmill score of 10.  Blood pressure demonstrated a hypertensive response to exercise.  Small, moderate intensity, partially reversible mid to basal inferolateral defect suggestive of a region of mixed scar and ischemia. Small, mild intensity, apical anteroseptal defect is fixed  This is a low risk study.  Nuclear stress EF: 52%.   Assessment and Plan     1. CAD -recent chest pain symptoms - recent stress low risk, mild ischemia - we will increase his  Lopressor to 20m bid for additional antianginal effects - if refractory symptoms will need to consider cath.   2. HTN - above goal, increasing lopressor to 284mbid  4. Hyperlipidemia - intolerant to statins, conintue dietary modification. He has not been interested in alternative agents - continue to monitor.   5. Carotid stenosis - mild bilateral disease by [place] will continue to monitor.   6. Palpitations - monitor shows frequent PVCs - symptoms resolved with decrease in caffeine and beta blocker  - no recent symptoms, continue to monitor.       JoArnoldo LenisM.D

## 2017-09-03 ENCOUNTER — Encounter: Payer: Self-pay | Admitting: Cardiology

## 2017-09-20 ENCOUNTER — Other Ambulatory Visit: Payer: Self-pay

## 2017-09-20 ENCOUNTER — Encounter (HOSPITAL_COMMUNITY): Payer: Self-pay | Admitting: *Deleted

## 2017-09-20 ENCOUNTER — Emergency Department (HOSPITAL_COMMUNITY): Payer: PPO

## 2017-09-20 ENCOUNTER — Inpatient Hospital Stay (HOSPITAL_COMMUNITY)
Admission: EM | Admit: 2017-09-20 | Discharge: 2017-09-23 | DRG: 247 | Disposition: A | Discharge status: Home / Self Care | Payer: PPO | Attending: Cardiovascular Disease | Admitting: Cardiovascular Disease

## 2017-09-20 DIAGNOSIS — Z9079 Acquired absence of other genital organ(s): Secondary | ICD-10-CM

## 2017-09-20 DIAGNOSIS — Z7982 Long term (current) use of aspirin: Secondary | ICD-10-CM | POA: Diagnosis not present

## 2017-09-20 DIAGNOSIS — Z8249 Family history of ischemic heart disease and other diseases of the circulatory system: Secondary | ICD-10-CM

## 2017-09-20 DIAGNOSIS — Z9861 Coronary angioplasty status: Secondary | ICD-10-CM

## 2017-09-20 DIAGNOSIS — R05 Cough: Secondary | ICD-10-CM | POA: Diagnosis not present

## 2017-09-20 DIAGNOSIS — Z86718 Personal history of other venous thrombosis and embolism: Secondary | ICD-10-CM | POA: Diagnosis not present

## 2017-09-20 DIAGNOSIS — Z955 Presence of coronary angioplasty implant and graft: Secondary | ICD-10-CM

## 2017-09-20 DIAGNOSIS — Z87891 Personal history of nicotine dependence: Secondary | ICD-10-CM | POA: Diagnosis not present

## 2017-09-20 DIAGNOSIS — I10 Essential (primary) hypertension: Secondary | ICD-10-CM | POA: Diagnosis present

## 2017-09-20 DIAGNOSIS — Z8546 Personal history of malignant neoplasm of prostate: Secondary | ICD-10-CM

## 2017-09-20 DIAGNOSIS — I214 Non-ST elevation (NSTEMI) myocardial infarction: Secondary | ICD-10-CM | POA: Diagnosis present

## 2017-09-20 DIAGNOSIS — R079 Chest pain, unspecified: Secondary | ICD-10-CM

## 2017-09-20 DIAGNOSIS — Z888 Allergy status to other drugs, medicaments and biological substances status: Secondary | ICD-10-CM | POA: Diagnosis not present

## 2017-09-20 DIAGNOSIS — Z91048 Other nonmedicinal substance allergy status: Secondary | ICD-10-CM

## 2017-09-20 DIAGNOSIS — I252 Old myocardial infarction: Secondary | ICD-10-CM

## 2017-09-20 DIAGNOSIS — Z806 Family history of leukemia: Secondary | ICD-10-CM | POA: Diagnosis not present

## 2017-09-20 DIAGNOSIS — I209 Angina pectoris, unspecified: Secondary | ICD-10-CM | POA: Diagnosis present

## 2017-09-20 DIAGNOSIS — Q231 Congenital insufficiency of aortic valve: Secondary | ICD-10-CM | POA: Diagnosis not present

## 2017-09-20 DIAGNOSIS — E782 Mixed hyperlipidemia: Secondary | ICD-10-CM | POA: Diagnosis present

## 2017-09-20 DIAGNOSIS — I739 Peripheral vascular disease, unspecified: Secondary | ICD-10-CM | POA: Diagnosis present

## 2017-09-20 DIAGNOSIS — E785 Hyperlipidemia, unspecified: Secondary | ICD-10-CM

## 2017-09-20 DIAGNOSIS — I2510 Atherosclerotic heart disease of native coronary artery without angina pectoris: Secondary | ICD-10-CM

## 2017-09-20 DIAGNOSIS — R072 Precordial pain: Secondary | ICD-10-CM | POA: Diagnosis not present

## 2017-09-20 DIAGNOSIS — R0602 Shortness of breath: Secondary | ICD-10-CM | POA: Diagnosis not present

## 2017-09-20 HISTORY — DX: Acute embolism and thrombosis of unspecified deep veins of unspecified lower extremity: I82.409

## 2017-09-20 HISTORY — DX: Ventricular premature depolarization: I49.3

## 2017-09-20 HISTORY — DX: Congenital malformation of aortic and mitral valves, unspecified: Q23.9

## 2017-09-20 HISTORY — DX: Other specified health status: Z78.9

## 2017-09-20 HISTORY — DX: Bradycardia, unspecified: R00.1

## 2017-09-20 LAB — BASIC METABOLIC PANEL
ANION GAP: 8 (ref 5–15)
Anion gap: 13 (ref 5–15)
BUN: 18 mg/dL (ref 6–20)
BUN: 12 mg/dL (ref 6–20)
CHLORIDE: 100 mmol/L — AB (ref 101–111)
CO2: 24 mmol/L (ref 22–32)
CO2: 26 mmol/L (ref 22–32)
Calcium: 9.8 mg/dL (ref 8.9–10.3)
Calcium: 9.6 mg/dL (ref 8.9–10.3)
Chloride: 105 mmol/L (ref 101–111)
Creatinine, Ser: 0.9 mg/dL (ref 0.61–1.24)
Creatinine, Ser: 0.85 mg/dL (ref 0.61–1.24)
GFR calc Af Amer: >60 mL/min (ref >60)
GFR calc non Af Amer: >60 mL/min (ref >60)
GFR calc non Af Amer: >60 mL/min (ref >60)
GLUCOSE: 103 mg/dL — AB (ref 65–99)
Glucose, Bld: 119 mg/dL — ABNORMAL HIGH (ref 65–99)
POTASSIUM: 4.1 mmol/L (ref 3.5–5.1)
POTASSIUM: 4.2 mmol/L (ref 3.5–5.1)
SODIUM: 137 mmol/L (ref 135–145)
Sodium: 139 mmol/L (ref 135–145)

## 2017-09-20 LAB — MRSA PCR SCREENING: MRSA BY PCR: NEGATIVE

## 2017-09-20 LAB — CBC
HCT: 49.7 % (ref 39.0–52.0)
HEMATOCRIT: 48.1 % (ref 39.0–52.0)
HEMOGLOBIN: 16.2 g/dL (ref 13.0–17.0)
HEMOGLOBIN: 16.1 g/dL (ref 13.0–17.0)
MCH: 30.5 pg (ref 26.0–34.0)
MCH: 30.8 pg (ref 26.0–34.0)
MCHC: 32.6 g/dL (ref 30.0–36.0)
MCHC: 33.5 g/dL (ref 30.0–36.0)
MCV: 93.4 fL (ref 78.0–100.0)
MCV: 92 fL (ref 78.0–100.0)
PLATELETS: 246 10*3/uL (ref 150–400)
Platelets: 231 10*3/uL (ref 150–400)
RBC: 5.32 MIL/uL (ref 4.22–5.81)
RBC: 5.23 MIL/uL (ref 4.22–5.81)
RDW: 12.8 % (ref 11.5–15.5)
RDW: 13.1 % (ref 11.5–15.5)
WBC: 8.2 10*3/uL (ref 4.0–10.5)
WBC: 7.6 10*3/uL (ref 4.0–10.5)

## 2017-09-20 LAB — I-STAT TROPONIN, ED: TROPONIN I, POC: 0.08 ng/mL (ref 0.00–0.08)

## 2017-09-20 LAB — PROTIME-INR
INR: 0.84
INR: 0.97
Prothrombin Time: 11.4 seconds (ref 11.4–15.2)
Prothrombin Time: 12.8 seconds (ref 11.4–15.2)

## 2017-09-20 LAB — HEPARIN LEVEL (UNFRACTIONATED)
HEPARIN UNFRACTIONATED: 0.15 [IU]/mL — AB (ref 0.30–0.70)
Heparin Unfractionated: 0.28 IU/mL — ABNORMAL LOW (ref 0.30–0.70)

## 2017-09-20 LAB — TROPONIN I: TROPONIN I: 1.05 ng/mL — AB (ref <0.03)

## 2017-09-20 MED ORDER — SODIUM CHLORIDE 0.9% FLUSH: 3.0000 mL | Freq: Two times a day (BID) | INTRAVENOUS | Status: DC

## 2017-09-20 MED ORDER — ONDANSETRON HCL 4 MG/2ML IJ SOLN: 4.0000 mg | Freq: Four times a day (QID) | INTRAMUSCULAR | Status: DC | PRN

## 2017-09-20 MED ORDER — ASPIRIN 81 MG PO CHEW
324.0000 mg | CHEWABLE_TABLET | ORAL | Status: AC
  Administered 2017-09-20: 324 mg via ORAL
  Filled 2017-09-20: qty 4

## 2017-09-20 MED ORDER — NITROGLYCERIN 0.4 MG SL SUBL
0.4000 mg | SUBLINGUAL_TABLET | SUBLINGUAL | Status: DC | PRN
Start: 2017-09-20 — End: 2017-09-23
  Filled 2017-09-20: qty 1

## 2017-09-20 MED ORDER — NITROGLYCERIN IN D5W 200-5 MCG/ML-% IV SOLN
0.0000 ug/min | INTRAVENOUS | Status: DC
  Administered 2017-09-21: 5 ug/min via INTRAVENOUS
  Filled 2017-09-20: qty 250

## 2017-09-20 MED ORDER — SODIUM CHLORIDE 0.9 % IV SOLN: 250.0000 mL | INTRAVENOUS | Status: DC | PRN

## 2017-09-20 MED ORDER — SODIUM CHLORIDE 0.9 % IV SOLN
INTRAVENOUS | Status: DC
  Administered 2017-09-21: 08:00:00 via INTRAVENOUS

## 2017-09-20 MED ORDER — ASPIRIN 81 MG PO CHEW
81.0000 mg | CHEWABLE_TABLET | ORAL | Status: AC
  Administered 2017-09-21: 81 mg via ORAL
  Filled 2017-09-20: qty 1

## 2017-09-20 MED ORDER — ASPIRIN 300 MG RE SUPP: 300.0000 mg | RECTAL | Status: AC

## 2017-09-20 MED ORDER — HEPARIN (PORCINE) IN NACL 100-0.45 UNIT/ML-% IJ SOLN
1250.0000 [IU]/h | INTRAMUSCULAR | Status: DC
  Administered 2017-09-20: 1000 [IU]/h via INTRAVENOUS
  Administered 2017-09-21: 1250 [IU]/h via INTRAVENOUS
  Filled 2017-09-20 (×2): qty 250

## 2017-09-20 MED ORDER — SODIUM CHLORIDE 0.9% FLUSH: 3.0000 mL | INTRAVENOUS | Status: DC | PRN

## 2017-09-20 MED ORDER — ASPIRIN EC 81 MG PO TBEC: 81.0000 mg | DELAYED_RELEASE_TABLET | Freq: Every day | ORAL | Status: DC

## 2017-09-20 MED ORDER — NITROGLYCERIN IN D5W 200-5 MCG/ML-% IV SOLN
5.0000 ug/min | Freq: Once | INTRAVENOUS | Status: AC
  Administered 2017-09-20: 40 ug/min via INTRAVENOUS
  Filled 2017-09-20: qty 250

## 2017-09-20 MED ORDER — HEPARIN BOLUS VIA INFUSION
4000.0000 [IU] | Freq: Once | INTRAVENOUS | Status: AC
  Administered 2017-09-20: 4000 [IU] via INTRAVENOUS

## 2017-09-20 MED ORDER — HEPARIN BOLUS VIA INFUSION
2000.0000 [IU] | Freq: Once | INTRAVENOUS | Status: AC
  Administered 2017-09-20: 2000 [IU] via INTRAVENOUS
  Filled 2017-09-20: qty 2000

## 2017-09-20 MED ORDER — ACETAMINOPHEN 325 MG PO TABS
650.0000 mg | ORAL_TABLET | ORAL | Status: DC | PRN
  Administered 2017-09-20 – 2017-09-21 (×2): 650 mg via ORAL
  Filled 2017-09-20 (×2): qty 2

## 2017-09-20 MED ORDER — MORPHINE SULFATE (PF) 2 MG/ML IV SOLN
2.0000 mg | INTRAVENOUS | Status: DC | PRN
  Administered 2017-09-20: 2 mg via INTRAVENOUS
  Filled 2017-09-20: qty 1

## 2017-09-20 MED ORDER — MORPHINE SULFATE (PF) 4 MG/ML IV SOLN
2.0000 mg | INTRAVENOUS | Status: DC | PRN
  Administered 2017-09-21 (×2): 2 mg via INTRAVENOUS
  Filled 2017-09-20 (×2): qty 1

## 2017-09-20 NOTE — Progress Notes (Signed)
[facility] arrived from [facility] with no distress noted. Called Dr. Harrington Challenger and Dr. Rosita Fire to inform of location. Reviewed orders. High risk medications infusing per protocol. Family at bedside. Patient is alert and orientedX4. CHG bath given. Will continue to monitor.

## 2017-09-20 NOTE — ED Notes (Signed)
Pt states he has not check pain, continues to have headache

## 2017-09-20 NOTE — ED Provider Notes (Signed)
[facility] EMERGENCY DEPARTMENT Provider Note   CSN: [id number] Arrival date & time: 09/20/17  1012     History   Chief Complaint Chief Complaint  Patient presents with  . Chest Pain    HPI [name] is a 66 y.o. male.  The history is provided by the patient and medical records.  Chest Pain   This is a recurrent problem. The current episode started 1 to 2 hours ago. The problem occurs constantly. The problem has not changed since onset.The pain is associated with exertion. The pain is present in the substernal region and lateral region. The pain is moderate. The quality of the pain is described as brief, heavy, pressure-like and vice-like. The pain radiates to the right shoulder and left shoulder. Associated symptoms include dizziness, exertional chest pressure, nausea, shortness of breath and weakness. Pertinent negatives include no back pain. He has tried nitroglycerin for the symptoms. The treatment provided moderate relief. Risk factors include male gender.  His past medical history is significant for CAD.    Past Medical History:  Diagnosis Date  . Abnormality of aortic valve    a. possibly bicuspid.  . Asthma    Childhood  . Carotid artery disease ([place])    a. 1-39% bilaterally in 2017.  Marland Kitchen Coronary artery disease    a. anterior STEMI 04/2011, s/p DES to LAD.  Marland Kitchen DVT (deep venous thrombosis) (HCC)    a. tx with Xarelto x 5 months, stopped due to hematuria.  . Frequent PVCs    a. seen on Holter monitor.  . Hyperlipidemia, mixed   . Hypertension   . Myocardial infarction ([place]) 05/21/2011   anterior wall, treated by Dr. [name] with 3.0x64mm Promus drug eluting stent to proximal LAD   . Peripheral arterial disease (HCC)    moderate left ICA disease, mild right ICA disease  . Prostate cancer ([place]) 2007  . Sinus bradycardia    a. HR 50s at home.  . Statin intolerance     Patient Active Problem List   Diagnosis Date Noted  . Unstable angina ([place])  09/20/2017  . Rupture of plantaris tendon, right, subsequent encounter 08/15/2016  . Hematoma of leg, right, subsequent encounter 08/15/2016  . Right calf pain 08/11/2016  . Right leg DVT ([place]) 08/11/2016  . Chest pain 10/08/2014  . Palpitations 10/08/2014  . Bilateral carotid bruits 10/08/2014  . CAD (coronary artery disease) 03/20/2013  . HTN (hypertension) 03/20/2013  . Mixed hyperlipidemia 03/20/2013    Past Surgical History:  Procedure Laterality Date  . CARDIAC CATHETERIZATION  05/23/2011, 05/21/2011   05/21/2011   3.0 x 42mm Promus Element drug eluting stent positioned in proximal LAD, stenosis taken from 99% to 0%.  Marland Kitchen HERNIA REPAIR    . PROSTATECTOMY  2007       Home Medications    Prior to Admission medications   Medication Sig Start Date End Date Taking? Authorizing Provider  amLODipine (NORVASC) 2.5 MG tablet TAKE (1) TABLET BY MOUTH ONCE DAILY. 06/19/17  Yes Herminio Commons, MD  aspirin 81 MG tablet Take 162 mg by mouth daily.    Yes [provider]  Coenzyme Q10 (CO Q-10) 100 MG CAPS Take 1 capsule by mouth daily.   Yes [provider]  losartan (COZAAR) 25 MG tablet TAKE ONE TABLET BY MOUTH ONCE DAILY. 06/19/17  Yes Herminio Commons, MD  metoprolol tartrate (LOPRESSOR) 25 MG tablet Take 1 tablet (25 mg total) by mouth 2 (two) times daily. 08/30/17  11/28/17 Yes Branch, Alphonse Guild, MD  NITROSTAT 0.4 MG SL tablet PLACE 1 UNDER TONGUE EVERY 5 MINUTES UP TO 3 DOSES AS NEEDED FOR CHEST PAIN. Patient taking differently: took 2 doses today 08/08/16  Yes Branch, Alphonse Guild, MD    Family History Family History  Problem Relation Age of Onset  . Leukemia Mother   . Heart failure Father        CABG at age 47    Social History Social History   Tobacco Use  . Smoking status: Former Smoker    Packs/day: 1.00    Years: 7.00    Pack years: 7.00    Types: Cigarettes    Start date: 03/05/1970    Last attempt to quit: 09/26/1976    Years since  quitting: 41.0  . Smokeless tobacco: Never Used  Substance Use Topics  . Alcohol use: No    Alcohol/week: 0.0 oz  . Drug use: No     Allergies   Adhesive [tape]; Demerol [meperidine]; and Fentanyl   Review of Systems Review of Systems  Respiratory: Positive for shortness of breath.   Cardiovascular: Positive for chest pain.  Gastrointestinal: Positive for nausea.  Musculoskeletal: Negative for back pain.  Neurological: Positive for dizziness and weakness.  All other systems reviewed and are negative.    Physical Exam Updated Vital Signs BP 113/69   Pulse 67   Temp 97.9 F (36.6 C) (Oral)   Resp 16   Ht 5\' 6"  (1.676 m)   Wt 83 kg (183 lb)   SpO2 95%   BMI 29.54 kg/m   Physical Exam  Constitutional: He appears well-developed and well-nourished.  HENT:  Head: Normocephalic and atraumatic.  Eyes: Conjunctivae and EOM are normal.  Neck: Normal range of motion.  Cardiovascular: Normal rate.  Pulmonary/Chest: Effort normal. No respiratory distress.  Abdominal: He exhibits no distension.  Musculoskeletal: Normal range of motion.  Neurological: He is alert.  Skin: Skin is warm and dry.  Nursing note and vitals reviewed.    ED Treatments / Results  Labs (all labs ordered are listed, but only abnormal results are displayed) Labs Reviewed  BASIC METABOLIC PANEL - Abnormal; Notable for the following components:      Result Value   Chloride 100 (*)    Glucose, Bld 119 (*)    All other components within normal limits  TROPONIN I - Abnormal; Notable for the following components:   Troponin I 1.05 (*)    All other components within normal limits  HEPARIN LEVEL (UNFRACTIONATED) - Abnormal; Notable for the following components:   Heparin Unfractionated 0.28 (*)    All other components within normal limits  CBC  PROTIME-INR  TROPONIN I  TROPONIN I  I-STAT TROPONIN, ED    EKG  EKG Interpretation  Date/Time:  Wednesday September 20 2017 10:20:31 EST Ventricular  Rate:  71 PR Interval:    QRS Duration: 99 QT Interval:  402 QTC Calculation: 437 R Axis:   -15 Text Interpretation:  Sinus Rhythm Borderline left axis deviation Low voltage, precordial leads Probable anterolateral infarct, old No significant change since last tracing Confirmed by Merrily Pew [id number]) on 09/20/2017 10:26:25 AM       Radiology Dg Chest 2 View  Result Date: 09/20/2017 CLINICAL DATA:  Chest pain and shortness of breath.  Cough. EXAM: CHEST  2 VIEW COMPARISON:  05/21/2011 and 07/04/2006 FINDINGS: The heart size and mediastinal contours are within normal limits. Coronary stent. Both lungs are clear. The visualized skeletal structures  are unremarkable. IMPRESSION: No acute abnormalities. Electronically Signed   By: Lorriane Shire M.D.   On: 09/20/2017 11:28    Procedures Procedures (including critical care time)  CRITICAL CARE Performed by: Merrily Pew Total critical care time: 35 minutes Critical care time was exclusive of separately billable procedures and treating other patients. Critical care was necessary to treat or prevent imminent or life-threatening deterioration. Critical care was time spent personally by me on the following activities: development of treatment plan with patient and/or surrogate as well as nursing, discussions with consultants, evaluation of patient's response to treatment, examination of patient, obtaining history from patient or surrogate, ordering and performing treatments and interventions, ordering and review of laboratory studies, ordering and review of radiographic studies, pulse oximetry and re-evaluation of patient's condition.   Medications Ordered in ED Medications  heparin bolus via infusion 4,000 Units (4,000 Units Intravenous Bolus from Bag 09/20/17 1152)    Followed by  heparin ADULT infusion 100 units/mL (25000 units/292mL sodium chloride 0.45%) (1,000 Units/hr Intravenous New Bag/Given 09/20/17 1155)  nitroGLYCERIN 50 mg in  dextrose 5 % 250 mL (0.2 mg/mL) infusion (30 mcg/min Intravenous Rate/Dose Change 09/20/17 1454)     Initial Impression / Assessment and Plan / ED Course  I have reviewed the triage vital signs and the nursing notes.  Pertinent labs & imaging results that were available during my care of the patient were reviewed by me and considered in my medical decision making (see chart for details).     States CP is just like his previous MI. ecg ok. Troponin ok. Started on NTG drip and heparin infusion secondary to risk factors. Discussed with Dr. Harrington Challenger who stated patient ok to stay here as he wouldn't likely get a cath today either way with normal ECG. Discussed with hospitalist who will admit.   Final Clinical Impressions(s) / ED Diagnoses   Final diagnoses:  Chest pain, unspecified type  NSTEMI (non-ST elevated myocardial infarction) [facility])    ED Discharge Orders    None       Elyssia Strausser, Corene Cornea, MD 09/20/17 1553

## 2017-09-20 NOTE — Consult Note (Signed)
Cardiology Consultation:   Patient ID: [name]; [id number]; 07-06-1951   Admit date: 09/20/2017 Date of Consult: 09/20/2017  Primary Care Provider: [name], MD Primary Cardiologist: Dr. Harl Bowie  Chief Complaint: chest pain  Patient Profile:   [name] is a 66 y.o. male with a hx of CAD (anterior STEMI 04/2011, s/p DES to LAD), probably bicuspid aortic valve, HTN, sinus bradycardia (reported HR 78s at home), HLD (intolerant of statins, not interested in alternatives), frequent PVCs by prior 48-hour monitor, prior provoked DVT (treated with Xarelto x 5 months, stopped due to hematuria), mild carotid artery disease, prostate CA who is being seen today for the evaluation of chest pain at the request of Dr. Dayna Barker.  History of Present Illness:   To recap history, he had PCI as above in 2012. He was taken back to the cath lab several days after original PCI due to recurrent CP/troponin rise and anatomy was found to be stable. There was a 40% mLAD but otherwise no significant dz otherwise. Last 2D echo 05/2011: EF 55-60%, impaired LV relaxation, moderate LAE, borderline prolapse of anterior mitral leaflet, mild MR, probably bicuspid AV without AS/AI. Last nuc was in 10/2014 - went 6 minutes with no EKG changes, no ischemia by imaging. He had a DVT after an injury with a truck, treated with 5 months of Xarelto - stopped due to hematuria for which no overt source was found per patient.  He exercises regularly. Today he was 18 minutes in on the treadmill when he developed indigestion-type central CP. He continued through and finished his 30 minute workout but pain only got worse the longer he exercised and continued after activity. This was associated with a deep cough and tremors in his hands which he states reminded him of when he had his MI in 2012. He took 2 SL NTG and became weak/dizzy. Symptoms did not abate fully so came to the ED. He was started on heparin and NTG gtt with gradual  improvement in symptoms. Now CP unable to be rated, just a sensation of mild awareness in his chest. Labs reveal normal troponin/CBC. No other recent bleeding, syncope, palpitations, vomiting.  Past Medical History:  Diagnosis Date  . Abnormality of aortic valve    a. possibly bicuspid.  . Asthma    Childhood  . Carotid artery disease ([place])    a. 1-39% bilaterally in 2017.  Marland Kitchen Coronary artery disease    a. anterior STEMI 04/2011, s/p DES to LAD.  Marland Kitchen DVT (deep venous thrombosis) (HCC)    a. tx with Xarelto x 5 months, stopped due to hematuria.  . Frequent PVCs    a. seen on Holter monitor.  . Hyperlipidemia, mixed   . Hypertension   . Myocardial infarction ([place]) 05/21/2011   anterior wall, treated by Dr. [name] with 3.0x59mm Promus drug eluting stent to proximal LAD   . Peripheral arterial disease (HCC)    moderate left ICA disease, mild right ICA disease  . Prostate cancer ([place]) 2007  . Sinus bradycardia    a. HR 50s at home.  . Statin intolerance     Past Surgical History:  Procedure Laterality Date  . CARDIAC CATHETERIZATION  05/23/2011, 05/21/2011   05/21/2011   3.0 x 62mm Promus Element drug eluting stent positioned in proximal LAD, stenosis taken from 99% to 0%.  Marland Kitchen HERNIA REPAIR    . PROSTATECTOMY  2007     Inpatient Medications: Scheduled Meds:  Continuous Infusions: . heparin 1,000  Units/hr (09/20/17 1155)   PRN Meds:   Home Meds: Prior to Admission medications   Medication Sig Start Date End Date Taking? Authorizing Provider  amLODipine (NORVASC) 2.5 MG tablet TAKE (1) TABLET BY MOUTH ONCE DAILY. 06/19/17  Yes Herminio Commons, MD  aspirin 81 MG tablet Take 162 mg by mouth daily.    Yes [provider]  Coenzyme Q10 (CO Q-10) 100 MG CAPS Take 1 capsule by mouth daily.   Yes [provider]  losartan (COZAAR) 25 MG tablet TAKE ONE TABLET BY MOUTH ONCE DAILY. 06/19/17  Yes Herminio Commons, MD  metoprolol tartrate (LOPRESSOR) 25  MG tablet Take 1 tablet (25 mg total) by mouth 2 (two) times daily. 08/30/17 11/28/17 Yes Branch, Alphonse Guild, MD  NITROSTAT 0.4 MG SL tablet PLACE 1 UNDER TONGUE EVERY 5 MINUTES UP TO 3 DOSES AS NEEDED FOR CHEST PAIN. Patient taking differently: took 2 doses today 08/08/16  Yes Branch, Alphonse Guild, MD    Allergies:    Allergies  Allergen Reactions  . Adhesive [Tape] Hives    Hives  And itching from the adhesive on the pads of the heart monitor   . Demerol [Meperidine] Nausea Only    Drop in blood pressure  Muscle spasm  . Fentanyl Nausea Only    Drop in blood pressure Muscle spasm    Social History:   Social History   Socioeconomic History  . Marital status: Married    Spouse name: Not on file  . Number of children: 0  . Years of education: Not on file  . Highest education level: Not on file  Social Needs  . Financial resource strain: Not on file  . Food insecurity - worry: Not on file  . Food insecurity - inability: Not on file  . Transportation needs - medical: Not on file  . Transportation needs - non-medical: Not on file  Occupational History  . Not on file  Tobacco Use  . Smoking status: Former Smoker    Packs/day: 1.00    Years: 7.00    Pack years: 7.00    Types: Cigarettes    Start date: 03/05/1970    Last attempt to quit: 09/26/1976    Years since quitting: 41.0  . Smokeless tobacco: Never Used  Substance and Sexual Activity  . Alcohol use: No    Alcohol/week: 0.0 oz  . Drug use: No  . Sexual activity: No  Other Topics Concern  . Not on file  Social History Narrative  . Not on file    Family History:   The patient's family history includes Heart failure in his father; Leukemia in his mother.  ROS:  Please see the history of present illness.  All other ROS reviewed and negative.     Physical Exam/Data:   Vitals:   09/20/17 1030 09/20/17 1100 09/20/17 1130 09/20/17 1200  BP: (!) 142/92 (!) 153/83 (!) 155/93 139/89  Pulse: 66 61 63 64  Resp: 15 12 14   (!) 9  Temp:      TempSrc:      SpO2: 97% 95% 98% 94%  Weight:      Height:       No intake or output data in the 24 hours ending 09/20/17 1227 Filed Weights   09/20/17 1024  Weight: 183 lb (83 kg)   Body mass index is 29.54 kg/m.  General: Well developed, well nourished WM, in no acute distress. Head: Normocephalic, atraumatic, sclera non-icteric, no xanthomas, nares are without  discharge.  Neck: Negative for carotid bruits. JVD not elevated. Lungs: Clear bilaterally to auscultation without wheezes, rales, or rhonchi. Breathing is unlabored. Heart: RRR with S1 S2. Minimal SEM. No rubs or gallops appreciated. Abdomen: Soft, non-tender, non-distended with normoactive bowel sounds. No hepatomegaly. No rebound/guarding. No obvious abdominal masses. Msk:  Strength and tone appear normal for age. Extremities: No clubbing or cyanosis. No edema.  Distal pedal pulses are 2+ and equal bilaterally. Neuro: Alert and oriented X 3. No facial asymmetry. No focal deficit. Moves all extremities spontaneously. Psych:  Responds to questions appropriately with a normal affect.  EKG:  The EKG was personally reviewed and demonstrates NSR 71bpm, poor R wave progression no acute changes   Laboratory Data:  Chemistry Recent Labs  Lab 09/20/17 1026  NA 137  K 4.1  CL 100*  CO2 24  GLUCOSE 119*  BUN 18  CREATININE 0.90  CALCIUM 9.8  GFRNONAA >60  GFRAA >60  ANIONGAP 13    No results for input(s): PROT, ALBUMIN, AST, ALT, ALKPHOS, BILITOT in the last 168 hours. Hematology Recent Labs  Lab 09/20/17 1026  WBC 8.2  RBC 5.32  HGB 16.2  HCT 49.7  MCV 93.4  MCH 30.5  MCHC 32.6  RDW 12.8  PLT 246   Cardiac EnzymesNo results for input(s): TROPONINI in the last 168 hours.  Recent Labs  Lab 09/20/17 1034  TROPIPOC 0.08    BNPNo results for input(s): BNP, PROBNP in the last 168 hours.  DDimer No results for input(s): DDIMER in the last 168 hours.  Radiology/Studies:  Dg Chest 2  View  Result Date: 09/20/2017 CLINICAL DATA:  Chest pain and shortness of breath.  Cough. EXAM: CHEST  2 VIEW COMPARISON:  05/21/2011 and 07/04/2006 FINDINGS: The heart size and mediastinal contours are within normal limits. Coronary stent. Both lungs are clear. The visualized skeletal structures are unremarkable. IMPRESSION: No acute abnormalities. Electronically Signed   By: Lorriane Shire M.D.   On: 09/20/2017 11:28    Assessment and Plan:   1. Chest pain/CAD - discomfort is concerning for unstable angina given the nature of his description. However, his initial ischemic workup is unremarkable and troponin is negative. Agree with aspirin, heparin, NTG gtt. Would also recommend updating echocardiogram upon admission given possible bicuspid aortic valve - he does not have any striking murmurs on exam but this information would be helpful to have. Suspect he will need cath - I will review with Dr. Harrington Challenger. Will also put in for repeat troponin to trend.  2. Prior h/o provoked DVT - not tachycardic, tachypneic or hypoxic. However, if no alternative etiology is found for CP, eval for PE could be considered once coronary plan is established.  3. HTN - initial BP elevated, now improved on NTG. Follow. May need to titrate antianginal regimen.  4. Hyperlipidemia - can revisit referral to lipid clinic as OP. Pt previously resistant to idea.   For questions or updates, please contact [place] Please consult [email or web address] for contact info under Cardiology/STEMI.    Signed, Charlie Pitter, PA-C  09/20/2017 12:27 PM   Pt seen and examined  I agree with findings as noted by D Dunn Pt is a 66 yo with known CAD  DES to LAD in 2012  Hasd had CP recently  Followed by Zandra Abts  Today with pain while walking on elliptical  Came to ED ON exam, pt with 4/10 pain  Down from 8/10    Neck:  JVP normal  Lungs CTA  Cardiac RRR  No S4  No rub  Ext without edema EKG without acute ST changes  Labs signif for  tropin greater than 1.  Recomm:  Admit to [facility] for L heart cath ASA, heparin, NTG and MSO4 for CP  Follow   Dorris Carnes

## 2017-09-20 NOTE — ED Notes (Signed)
[facility] here for patient, report and paperwork given to [employer]

## 2017-09-20 NOTE — ED Notes (Signed)
Per Hospitalist, pt will be going to [facility]

## 2017-09-20 NOTE — Progress Notes (Signed)
    Pt arrived from [facility] for NSTEMI. He is currently CP free on IV heparin and IV nitro. VSS. He will be made NPO at midnight with plans for [facility] tomorrow. I have reviewed the risks, indications, and alternatives to cardiac catheterization and possible angioplasty/stenting with the patient. Risks include but are not limited to bleeding, infection, vascular injury, stroke, myocardial infection, arrhythmia, kidney injury, radiation-related injury in the case of prolonged fluoroscopy use, emergency cardiac surgery, and death. The patient understands the risks of serious complication is low (<5%).   Lyda Jester, PA-C

## 2017-09-20 NOTE — Progress Notes (Signed)
Called by Dr. Dayna Barker to admit this patient to any pain hospital for workup of chest pain.  Noted that patient ruled in for ACS with positive troponins.  He was started on heparin and IV nitroglycerin.  Discussed with Dr. Harrington Challenger who will transfer the patient to [facility] under cardiology service for cardiac catheterization.  Please let the hospitalist service know if we can be of any further assistance.  Kathie Dike MD

## 2017-09-20 NOTE — ED Notes (Signed)
Hospitalist at the bedside.  Will advise if pt  Will stay here or go to [facility]. ICU called for report. Advised we are waiting. Pt complaining of headache

## 2017-09-20 NOTE — Progress Notes (Signed)
ANTICOAGULATION CONSULT NOTE - Initial Consult  Pharmacy Consult for Heparin Indication: chest pain/ACS  Allergies  Allergen Reactions  . Adhesive [Tape] Hives    Hives  And itching from the adhesive on the pads of the heart monitor   . Demerol [Meperidine] Nausea Only    Drop in blood pressure  Muscle spasm  . Fentanyl Nausea Only    Drop in blood pressure Muscle spasm    Patient Measurements: Height: 5\' 6"  (167.6 cm) Weight: 183 lb (83 kg) IBW/kg (Calculated) : 63.8 HEPARIN DW (KG): 80.7   Vital Signs: Temp: 97.9 F (36.6 C) (12/26 1022) Temp Source: Oral (12/26 1022) BP: 153/83 (12/26 1100) Pulse Rate: 61 (12/26 1100)  Labs: Recent Labs    09/20/17 1026  HGB 16.2  HCT 49.7  PLT 246  CREATININE 0.90    Estimated Creatinine Clearance: 80.5 mL/min (by C-G formula based on SCr of 0.9 mg/dL).   Medical History: Past Medical History:  Diagnosis Date  . Asthma    Childhood  . Carotid artery disease ([place])    carotid dopplers 05/27/2008, showed moderate left ICA disease, and disease in left ECA   . Coronary artery disease   . Hyperlipidemia, mixed   . Hypertension   . Myocardial infarction ([place]) 05/21/2011   anterior wall, treated by Dr. [name] with 3.0x54mm Promus drug eluting stent to proximal LAD   . Peripheral arterial disease (HCC)    moderate left ICA disease, mild right ICA disease  . Prostate cancer ([place]) 2007    Medications:  See med rec  Assessment: 66 yo male presented to ED with chest pain. Pharmacy asked to start Heparin for anticoagulation.  Goal of Therapy:  Heparin level 0.3-0.7 units/ml Monitor platelets by anticoagulation protocol: Yes   Plan:  Give 4000 units bolus x 1 Start heparin infusion at 1000 units/hr Check anti-Xa level in 6-8 hours and daily while on heparin Continue to monitor H&H and platelets  Isac Sarna, BS [place], BCPS Clinical Pharmacist Pager [phone] 09/20/2017,11:19 AM

## 2017-09-20 NOTE — ED Triage Notes (Signed)
Pt states he was exercising at 0845 and he began to have some chest pain; pt states he took 2 of his nitroglycerin and has had 500mg  of asa; pt states he now feels sob and his pain has decreased

## 2017-09-20 NOTE — ED Notes (Signed)
CRITICAL VALUE ALERT  Critical Value:  Troponin - 1.05  Date & Time Notied:  09/20/17 1519  Provider Notified: Dr Roderic Palau  Orders Received/Actions taken:

## 2017-09-20 NOTE — Progress Notes (Signed)
ANTICOAGULATION CONSULT NOTE - Follow Up Consult  Pharmacy Consult for Heparin Indication: chest pain/ACS  Allergies  Allergen Reactions  . Adhesive [Tape] Hives    Hives  And itching from the adhesive on the pads of the heart monitor   . Demerol [Meperidine] Nausea Only    Drop in blood pressure  Muscle spasm  . Fentanyl Nausea Only    Drop in blood pressure Muscle spasm    Patient Measurements: Height: 5\' 6"  (167.6 cm) Weight: 173 lb 15.1 oz (78.9 kg) IBW/kg (Calculated) : 63.8 HEPARIN DW (KG): 78.9   Vital Signs: Temp: 97.9 F (36.6 C) (12/26 1022) Temp Source: Oral (12/26 1022) BP: 120/81 (12/26 1800) Pulse Rate: 84 (12/26 1800)  Labs: Recent Labs    09/20/17 1026 09/20/17 1418 09/20/17 1419 09/20/17 1811 09/20/17 1825  HGB 16.2  --   --   --  16.1  HCT 49.7  --   --   --  48.1  PLT 246  --   --   --  231  LABPROT 11.4  --   --   --  12.8  INR 0.84  --   --   --  0.97  HEPARINUNFRC  --   --  0.28* 0.15*  --   CREATININE 0.90  --   --   --  0.85  TROPONINI  --  1.05*  --   --   --     Estimated Creatinine Clearance: 83.3 mL/min (by C-G formula based on SCr of 0.85 mg/dL).   Medical History: Past Medical History:  Diagnosis Date  . Abnormality of aortic valve    a. possibly bicuspid.  . Asthma    Childhood  . Carotid artery disease ([place])    a. 1-39% bilaterally in 2017.  Marland Kitchen Coronary artery disease    a. anterior STEMI 04/2011, s/p DES to LAD.  Marland Kitchen DVT (deep venous thrombosis) (HCC)    a. tx with Xarelto x 5 months, stopped due to hematuria.  . Frequent PVCs    a. seen on Holter monitor.  . Hyperlipidemia, mixed   . Hypertension   . Myocardial infarction ([place]) 05/21/2011   anterior wall, treated by Dr. [name] with 3.0x70mm Promus drug eluting stent to proximal LAD   . Peripheral arterial disease (HCC)    moderate left ICA disease, mild right ICA disease  . Prostate cancer ([place]) 2007  . Sinus bradycardia    a. HR 50s at home.  . Statin  intolerance     Medications:  See med rec  Assessment: 66 yo male presented to ED with chest pain. Pharmacy asked to start Heparin for anticoagulation.  He was transferred to [facility] with plans for cath in am.  Currently CP free Troponin bump 1.05.   Heparin drip 1000 uts/hr HL 0.15 < goal, no bleeding noted.    Goal of Therapy:  Heparin level 0.3-0.7 units/ml Monitor platelets by anticoagulation protocol: Yes   Plan:  Heparin bolus 2000 uts IV x1 Increase heparin dip 1250 uts/hr Daily HL, CBC  Bonnita Nasuti Pharm.D. CPP, BCPS Clinical Pharmacist [phone] 09/20/2017 7:17 PM

## 2017-09-21 ENCOUNTER — Encounter (HOSPITAL_COMMUNITY)
Admission: EM | Disposition: A | Discharge status: Home / Self Care | Payer: Self-pay | Source: Home / Self Care | Attending: Internal Medicine

## 2017-09-21 ENCOUNTER — Other Ambulatory Visit: Payer: Self-pay

## 2017-09-21 DIAGNOSIS — I214 Non-ST elevation (NSTEMI) myocardial infarction: Principal | ICD-10-CM

## 2017-09-21 DIAGNOSIS — I2510 Atherosclerotic heart disease of native coronary artery without angina pectoris: Secondary | ICD-10-CM

## 2017-09-21 DIAGNOSIS — E782 Mixed hyperlipidemia: Secondary | ICD-10-CM

## 2017-09-21 HISTORY — PX: CORONARY STENT INTERVENTION: CATH118234

## 2017-09-21 HISTORY — PX: LEFT HEART CATH AND CORONARY ANGIOGRAPHY: CATH118249

## 2017-09-21 HISTORY — PX: CORONARY/GRAFT ACUTE MI REVASCULARIZATION: CATH118305

## 2017-09-21 HISTORY — PX: CORONARY PRESSURE/FFR STUDY: CATH118243

## 2017-09-21 LAB — CBC
HEMATOCRIT: 45.4 % (ref 39.0–52.0)
HEMOGLOBIN: 15.4 g/dL (ref 13.0–17.0)
MCH: 31.1 pg (ref 26.0–34.0)
MCHC: 33.9 g/dL (ref 30.0–36.0)
MCV: 91.7 fL (ref 78.0–100.0)
Platelets: 219 10*3/uL (ref 150–400)
RBC: 4.95 MIL/uL (ref 4.22–5.81)
RDW: 13.1 % (ref 11.5–15.5)
WBC: 9.5 10*3/uL (ref 4.0–10.5)

## 2017-09-21 LAB — CK TOTAL AND CKMB (NOT AT ARMC)
CK TOTAL: 59 U/L (ref 49–397)
CK, MB: 2.4 ng/mL (ref 0.5–5.0)
Relative Index: INVALID (ref 0.0–2.5)

## 2017-09-21 LAB — BASIC METABOLIC PANEL
ANION GAP: 10 (ref 5–15)
BUN: 16 mg/dL (ref 6–20)
CHLORIDE: 100 mmol/L — AB (ref 101–111)
CO2: 25 mmol/L (ref 22–32)
Calcium: 8.9 mg/dL (ref 8.9–10.3)
Creatinine, Ser: 0.99 mg/dL (ref 0.61–1.24)
GFR calc non Af Amer: >60 mL/min (ref >60)
Glucose, Bld: 112 mg/dL — ABNORMAL HIGH (ref 65–99)
POTASSIUM: 3.9 mmol/L (ref 3.5–5.1)
Sodium: 135 mmol/L (ref 135–145)

## 2017-09-21 LAB — TROPONIN I
TROPONIN I: 0.28 ng/mL — AB (ref <0.03)
TROPONIN I: 0.31 ng/mL — AB (ref <0.03)

## 2017-09-21 LAB — HEPARIN LEVEL (UNFRACTIONATED): HEPARIN UNFRACTIONATED: 0.34 [IU]/mL (ref 0.30–0.70)

## 2017-09-21 LAB — POCT ACTIVATED CLOTTING TIME: ACTIVATED CLOTTING TIME: 428 s

## 2017-09-21 SURGERY — CORONARY/GRAFT ACUTE MI REVASCULARIZATION
Anesthesia: LOCAL

## 2017-09-21 SURGERY — LEFT HEART CATH AND CORONARY ANGIOGRAPHY
Anesthesia: LOCAL

## 2017-09-21 MED ORDER — LIDOCAINE HCL (PF) 1 % IJ SOLN
INTRAMUSCULAR | Status: AC
Start: 2017-09-21 — End: ?
  Filled 2017-09-21: qty 30

## 2017-09-21 MED ORDER — HEPARIN (PORCINE) IN NACL 2-0.9 UNIT/ML-% IJ SOLN
INTRAMUSCULAR | Status: AC | PRN
  Administered 2017-09-21: 1000 mL

## 2017-09-21 MED ORDER — HYDRALAZINE HCL 20 MG/ML IJ SOLN: 5.0000 mg | INTRAMUSCULAR | Status: AC | PRN

## 2017-09-21 MED ORDER — BIVALIRUDIN BOLUS VIA INFUSION - CUPID
INTRAVENOUS | Status: DC | PRN
  Administered 2017-09-21: 59.175 mg via INTRAVENOUS

## 2017-09-21 MED ORDER — MIDAZOLAM HCL 2 MG/2ML IJ SOLN
INTRAMUSCULAR | Status: DC | PRN
  Administered 2017-09-21: 2 mg via INTRAVENOUS

## 2017-09-21 MED ORDER — SODIUM CHLORIDE 0.9 % IV SOLN
INTRAVENOUS | Status: AC | PRN
  Administered 2017-09-21: 1.75 mg/kg/h via INTRAVENOUS

## 2017-09-21 MED ORDER — LIDOCAINE HCL (PF) 1 % IJ SOLN
INTRAMUSCULAR | Status: DC | PRN
  Administered 2017-09-21: 2 mL

## 2017-09-21 MED ORDER — FAMOTIDINE 20 MG PO TABS
40.0000 mg | ORAL_TABLET | Freq: Once | ORAL | Status: AC
Start: 2017-09-21 — End: 2017-09-21
  Administered 2017-09-21: 40 mg via ORAL
  Filled 2017-09-21: qty 2

## 2017-09-21 MED ORDER — SODIUM CHLORIDE 0.9 % IV SOLN
1.7500 mg/kg/h | INTRAVENOUS | Status: AC
  Administered 2017-09-21 (×2): 1.75 mg/kg/h via INTRAVENOUS
  Filled 2017-09-21 (×3): qty 250

## 2017-09-21 MED ORDER — IOPAMIDOL (ISOVUE-370) INJECTION 76%
INTRAVENOUS | Status: AC
  Filled 2017-09-21: qty 50

## 2017-09-21 MED ORDER — TICAGRELOR 90 MG PO TABS
ORAL_TABLET | ORAL | Status: AC
  Filled 2017-09-21: qty 2

## 2017-09-21 MED ORDER — ADENOSINE (DIAGNOSTIC) 140MCG/KG/MIN
INTRAVENOUS | Status: DC | PRN
  Administered 2017-09-21: 140 ug/kg/min via INTRAVENOUS

## 2017-09-21 MED ORDER — HEPARIN SODIUM (PORCINE) 1000 UNIT/ML IJ SOLN
INTRAMUSCULAR | Status: DC | PRN
  Administered 2017-09-21: 4000 [IU] via INTRAVENOUS

## 2017-09-21 MED ORDER — ALPRAZOLAM 0.5 MG PO TABS: 0.5000 mg | ORAL_TABLET | Freq: Once | ORAL | Status: DC

## 2017-09-21 MED ORDER — HEPARIN BOLUS VIA INFUSION
1000.0000 [IU] | Freq: Once | INTRAVENOUS | Status: DC
  Administered 2017-09-21: 1000 [IU] via INTRAVENOUS
  Filled 2017-09-21: qty 1000

## 2017-09-21 MED ORDER — SODIUM CHLORIDE 0.9% FLUSH
3.0000 mL | Freq: Two times a day (BID) | INTRAVENOUS | Status: DC
  Administered 2017-09-21 – 2017-09-22 (×2): 3 mL via INTRAVENOUS

## 2017-09-21 MED ORDER — HEPARIN (PORCINE) IN NACL 2-0.9 UNIT/ML-% IJ SOLN
INTRAMUSCULAR | Status: AC
  Filled 2017-09-21: qty 1000

## 2017-09-21 MED ORDER — ONDANSETRON HCL 4 MG/2ML IJ SOLN: 4.0000 mg | Freq: Four times a day (QID) | INTRAMUSCULAR | Status: DC | PRN

## 2017-09-21 MED ORDER — ACETAMINOPHEN 325 MG PO TABS: 650.0000 mg | ORAL_TABLET | ORAL | Status: DC | PRN

## 2017-09-21 MED ORDER — IOPAMIDOL (ISOVUE-370) INJECTION 76%
INTRAVENOUS | Status: DC | PRN
  Administered 2017-09-21: 130 mL via INTRA_ARTERIAL

## 2017-09-21 MED ORDER — TICAGRELOR 90 MG PO TABS
ORAL_TABLET | ORAL | Status: DC | PRN
  Administered 2017-09-21: 180 mg via ORAL

## 2017-09-21 MED ORDER — VERAPAMIL HCL 2.5 MG/ML IV SOLN
INTRAVENOUS | Status: DC | PRN
  Administered 2017-09-21: 10 mL via INTRA_ARTERIAL

## 2017-09-21 MED ORDER — BIVALIRUDIN TRIFLUOROACETATE 250 MG IV SOLR
INTRAVENOUS | Status: AC
  Filled 2017-09-21: qty 250

## 2017-09-21 MED ORDER — SODIUM CHLORIDE 0.9 % IV SOLN: 250.0000 mL | INTRAVENOUS | Status: DC | PRN

## 2017-09-21 MED ORDER — HEPARIN SODIUM (PORCINE) 1000 UNIT/ML IJ SOLN
INTRAMUSCULAR | Status: AC
  Filled 2017-09-21: qty 1

## 2017-09-21 MED ORDER — TICAGRELOR 90 MG PO TABS
90.0000 mg | ORAL_TABLET | Freq: Two times a day (BID) | ORAL | Status: DC
  Administered 2017-09-22 (×2): 90 mg via ORAL
  Filled 2017-09-21 (×3): qty 1

## 2017-09-21 MED ORDER — IOPAMIDOL (ISOVUE-370) INJECTION 76%
INTRAVENOUS | Status: AC
Start: 2017-09-21 — End: ?
  Filled 2017-09-21: qty 100

## 2017-09-21 MED ORDER — LIDOCAINE BOLUS VIA INFUSION
100.0000 mg | Freq: Once | INTRAVENOUS | Status: DC
  Filled 2017-09-21: qty 100

## 2017-09-21 MED ORDER — ADENOSINE 12 MG/4ML IV SOLN
INTRAVENOUS | Status: AC
  Filled 2017-09-21: qty 16

## 2017-09-21 MED ORDER — NITROGLYCERIN 1 MG/10 ML FOR IR/CATH LAB
INTRA_ARTERIAL | Status: AC
  Filled 2017-09-21: qty 10

## 2017-09-21 MED ORDER — HEPARIN (PORCINE) IN NACL 2-0.9 UNIT/ML-% IJ SOLN: INTRAMUSCULAR | Status: DC | PRN

## 2017-09-21 MED ORDER — METOPROLOL TARTRATE 12.5 MG HALF TABLET
12.5000 mg | ORAL_TABLET | Freq: Two times a day (BID) | ORAL | Status: DC
  Administered 2017-09-21 (×2): 12.5 mg via ORAL
  Filled 2017-09-21 (×2): qty 1

## 2017-09-21 MED ORDER — LABETALOL HCL 5 MG/ML IV SOLN: 10.0000 mg | INTRAVENOUS | Status: AC | PRN

## 2017-09-21 MED ORDER — HEPARIN (PORCINE) IN NACL 100-0.45 UNIT/ML-% IJ SOLN
INTRAMUSCULAR | Status: AC
  Filled 2017-09-21: qty 250

## 2017-09-21 MED ORDER — VERAPAMIL HCL 2.5 MG/ML IV SOLN
INTRA_ARTERIAL | Status: DC | PRN
  Administered 2017-09-21: 5 mL via INTRA_ARTERIAL

## 2017-09-21 MED ORDER — SODIUM CHLORIDE 0.9% FLUSH: 3.0000 mL | INTRAVENOUS | Status: DC | PRN

## 2017-09-21 MED ORDER — MIDAZOLAM HCL 2 MG/2ML IJ SOLN
INTRAMUSCULAR | Status: AC
  Filled 2017-09-21: qty 2

## 2017-09-21 MED ORDER — ASPIRIN 81 MG PO CHEW
81.0000 mg | CHEWABLE_TABLET | Freq: Every day | ORAL | Status: DC
  Administered 2017-09-21 – 2017-09-23 (×3): 81 mg via ORAL
  Filled 2017-09-21 (×3): qty 1

## 2017-09-21 MED ORDER — SODIUM CHLORIDE 0.9 % IV SOLN: INTRAVENOUS | Status: DC

## 2017-09-21 MED ORDER — SODIUM CHLORIDE 0.45 % IV SOLN: INTRAVENOUS | Status: AC

## 2017-09-21 MED ORDER — VERAPAMIL HCL 2.5 MG/ML IV SOLN
INTRAVENOUS | Status: AC
  Filled 2017-09-21: qty 2

## 2017-09-21 SURGICAL SUPPLY — 21 items
BALLN SAPPHIRE ~~LOC~~ 2.5X12 (BALLOONS) ×1 IMPLANT
CATH INFINITI 5FR ANG PIGTAIL (CATHETERS) ×1 IMPLANT
CATH INFINITI JR4 5F (CATHETERS) ×1 IMPLANT
CATH MICROCATH NAVVUS (MICROCATHETER) IMPLANT
CATH OPTITORQUE TIG 4.0 5F (CATHETERS) ×1 IMPLANT
CATH VISTA GUIDE 6FR XB3.5 (CATHETERS) ×1 IMPLANT
DEVICE RAD COMP TR BAND LRG (VASCULAR PRODUCTS) ×1 IMPLANT
GLIDESHEATH SLEND A-KIT 6F 22G (SHEATH) ×1 IMPLANT
GUIDEWIRE INQWIRE 1.5J.035X260 (WIRE) IMPLANT
INQWIRE 1.5J .035X260CM (WIRE) ×2
KIT ENCORE 26 ADVANTAGE (KITS) ×1 IMPLANT
KIT ESSENTIALS PG (KITS) ×1 IMPLANT
KIT HEART LEFT (KITS) ×2 IMPLANT
MICROCATHETER NAVVUS (MICROCATHETER) ×2
PACK CARDIAC CATHETERIZATION (CUSTOM PROCEDURE TRAY) ×2 IMPLANT
STENT SYNERGY DES 2.25X16 (Permanent Stent) ×1 IMPLANT
SYR MEDRAD MARK V 150ML (SYRINGE) ×2 IMPLANT
TRANSDUCER W/STOPCOCK (MISCELLANEOUS) ×2 IMPLANT
TUBING CIL FLEX 10 FLL-RA (TUBING) ×2 IMPLANT
WIRE ASAHI PROWATER 180CM (WIRE) ×1 IMPLANT
WIRE HI TORQ VERSACORE-J 145CM (WIRE) ×1 IMPLANT

## 2017-09-21 SURGICAL SUPPLY — 13 items
CATH INFINITI JR4 5F (CATHETERS) ×1 IMPLANT
CATH LAUNCHER 6FR EBU3.5 (CATHETERS) ×1 IMPLANT
DEVICE RAD COMP TR BAND LRG (VASCULAR PRODUCTS) ×1 IMPLANT
ELECT DEFIB PAD ADLT CADENCE (PAD) ×1 IMPLANT
GLIDESHEATH SLEND SS 6F .021 (SHEATH) ×1 IMPLANT
GUIDEWIRE INQWIRE 1.5J.035X260 (WIRE) IMPLANT
INQWIRE 1.5J .035X260CM (WIRE) ×2
KIT ENCORE 26 ADVANTAGE (KITS) ×1 IMPLANT
KIT HEART LEFT (KITS) ×2 IMPLANT
PACK CARDIAC CATHETERIZATION (CUSTOM PROCEDURE TRAY) ×2 IMPLANT
TRANSDUCER W/STOPCOCK (MISCELLANEOUS) ×2 IMPLANT
TUBING CIL FLEX 10 FLL-RA (TUBING) ×2 IMPLANT
WIRE ASAHI PROWATER 180CM (WIRE) ×1 IMPLANT

## 2017-09-21 NOTE — Progress Notes (Signed)
ANTICOAGULATION CONSULT NOTE - Follow Up Consult  Pharmacy Consult for Heparin Indication: chest pain/ACS  Allergies  Allergen Reactions  . Adhesive [Tape] Hives    Hives  And itching from the adhesive on the pads of the heart monitor   . Demerol [Meperidine] Nausea Only    Drop in blood pressure  Muscle spasm  . Fentanyl Nausea Only    Drop in blood pressure Muscle spasm    Patient Measurements: Height: 5\' 6"  (167.6 cm) Weight: 173 lb 15.1 oz (78.9 kg) IBW/kg (Calculated) : 63.8 HEPARIN DW (KG): 78.9   Vital Signs: Temp: 98 F (36.7 C) (12/27 0415) Temp Source: Oral (12/27 0415) BP: 104/76 (12/27 0700) Pulse Rate: 76 (12/27 0700)  Labs: Recent Labs    09/20/17 1026 09/20/17 1418 09/20/17 1419 09/20/17 1811 09/20/17 1825 09/21/17 0219  HGB 16.2  --   --   --  16.1 15.4  HCT 49.7  --   --   --  48.1 45.4  PLT 246  --   --   --  231 219  LABPROT 11.4  --   --   --  12.8  --   INR 0.84  --   --   --  0.97  --   HEPARINUNFRC  --   --  0.28* 0.15*  --  0.34  CREATININE 0.90  --   --   --  0.85 0.99  TROPONINI  --  1.05*  --   --   --   --     Estimated Creatinine Clearance: 71.5 mL/min (by C-G formula based on SCr of 0.99 mg/dL).   Medical History: Past Medical History:  Diagnosis Date  . Abnormality of aortic valve    a. possibly bicuspid.  . Asthma    Childhood  . Carotid artery disease ([place])    a. 1-39% bilaterally in 2017.  Marland Kitchen Coronary artery disease    a. anterior STEMI 04/2011, s/p DES to LAD.  Marland Kitchen DVT (deep venous thrombosis) (HCC)    a. tx with Xarelto x 5 months, stopped due to hematuria.  . Frequent PVCs    a. seen on Holter monitor.  . Hyperlipidemia, mixed   . Hypertension   . Myocardial infarction ([place]) 05/21/2011   anterior wall, treated by Dr. [name] with 3.0x85mm Promus drug eluting stent to proximal LAD   . Peripheral arterial disease (HCC)    moderate left ICA disease, mild right ICA disease  . Prostate cancer ([place]) 2007  .  Sinus bradycardia    a. HR 50s at home.  . Statin intolerance     Medications:  See med rec  Assessment: 66 yo male presented to ED with chest pain. Pharmacy asked to start Heparin for anticoagulation.  He was transferred to [facility] with plans for cath today.  Currently CP free Troponin bump 1.05.   Heparin drip 1250 uts/hr HL 0.34 at goal, no bleeding noted, CBC stable.  Goal of Therapy:  Heparin level 0.3-0.7 units/ml Monitor platelets by anticoagulation protocol: Yes   Plan:  Continue heparin dip 1250 uts/hr Daily HL, CBC Follow up after cath   Bonnita Nasuti Pharm.D. CPP, BCPS Clinical Pharmacist [phone] 09/21/2017 7:46 AM

## 2017-09-21 NOTE — Progress Notes (Signed)
Progress Note  Patient Name: [name] Date of Encounter: 09/21/2017  Primary Cardiologist: No primary care provider on file.   Subjective   Chest pain eased off from last night.  On IV nitroglycerin and heparin.  No shortness of breath.  Wife in room.  Ready for cath.  Inpatient Medications    Scheduled Meds: . [START ON 09/22/2017] aspirin EC  81 mg Oral Daily  . sodium chloride flush  3 mL Intravenous Q12H   Continuous Infusions: . sodium chloride    . sodium chloride 50 mL/hr at 09/21/17 0757  . heparin 1,250 Units/hr (09/21/17 0549)  . nitroGLYCERIN 30 mcg/min (09/21/17 0700)   PRN Meds: sodium chloride, acetaminophen, morphine injection, nitroGLYCERIN, ondansetron (ZOFRAN) IV, sodium chloride flush   Vital Signs    Vitals:   09/21/17 0800 09/21/17 0837 09/21/17 0846 09/21/17 0900  BP: 95/71   111/67  Pulse: 77  81 80  Resp: 11  20 16   Temp:  97.9 F (36.6 C)    TempSrc:  Oral    SpO2: 94%  91% 91%  Weight:      Height:        Intake/Output Summary (Last 24 hours) at 09/21/2017 1014 Last data filed at 09/21/2017 0900 Gross per 24 hour  Intake 956.35 ml  Output -  Net 956.35 ml   Filed Weights   09/20/17 1024 09/20/17 1700  Weight: 183 lb (83 kg) 173 lb 15.1 oz (78.9 kg)    Telemetry    Sinus rhythm with occasional PVCs.- Personally Reviewed  ECG    Sinus rhythm, poor R wave progression, otherwise unremarkable- Personally Reviewed  Physical Exam   GEN: No acute distress.   Neck: No JVD Cardiac: RRR, no murmurs, rubs, or gallops.  Respiratory: Clear to auscultation bilaterally. GI: Soft, nontender, non-distended  MS: No edema; No deformity.  2+ radial pulse Neuro:  Nonfocal  Psych: Normal affect   Labs    Chemistry Recent Labs  Lab 09/20/17 1026 09/20/17 1825 09/21/17 0219  NA 137 139 135  K 4.1 4.2 3.9  CL 100* 105 100*  CO2 24 26 25   GLUCOSE 119* 103* 112*  BUN 18 12 16   CREATININE 0.90 0.85 0.99  CALCIUM 9.8 9.6 8.9   GFRNONAA >60 >60 >60  GFRAA >60 >60 >60  ANIONGAP 13 8 10      Hematology Recent Labs  Lab 09/20/17 1026 09/20/17 1825 09/21/17 0219  WBC 8.2 7.6 9.5  RBC 5.32 5.23 4.95  HGB 16.2 16.1 15.4  HCT 49.7 48.1 45.4  MCV 93.4 92.0 91.7  MCH 30.5 30.8 31.1  MCHC 32.6 33.5 33.9  RDW 12.8 13.1 13.1  PLT 246 231 219    Cardiac Enzymes Recent Labs  Lab 09/20/17 1418  TROPONINI 1.05*    Recent Labs  Lab 09/20/17 1034  TROPIPOC 0.08     BNPNo results for input(s): BNP, PROBNP in the last 168 hours.   DDimer No results for input(s): DDIMER in the last 168 hours.   Radiology    Dg Chest 2 View  Result Date: 09/20/2017 CLINICAL DATA:  Chest pain and shortness of breath.  Cough. EXAM: CHEST  2 VIEW COMPARISON:  05/21/2011 and 07/04/2006 FINDINGS: The heart size and mediastinal contours are within normal limits. Coronary stent. Both lungs are clear. The visualized skeletal structures are unremarkable. IMPRESSION: No acute abnormalities. Electronically Signed   By: Lorriane Shire M.D.   On: 09/20/2017 11:28    Cardiac Studies  Cardiac catheterization pending  Patient Profile     66 y.o. male with non-ST elevation myocardial infarction  Assessment & Plan    Non-ST elevation myocardial infarction -Cardiac catheterization.  IV heparin, IV nitro, aspirin, beta-blocker, low-dose, has had bradycardia at times.  Hopefully will be able to trial beta-blocker short-term during his MI.  Patient refuses statin therapy.  Refer to lipid clinic as outpatient.  CAD - Prior DES to LAD August 20 12.  Anterior STEMI at the time.  Probable bicuspid aortic valve  Frequent PVCs  - No changes.  Seen on telemetry.  Prior provoked DVT -Xarelto 5 months stopped due to hematuria.    For questions or updates, please contact [place] Please consult [email or web address] for contact info under Cardiology/STEMI.      Signed, Candee Furbish, MD  09/21/2017, 10:14 AM

## 2017-09-21 NOTE — Progress Notes (Signed)
Patient taken to cath lab

## 2017-09-21 NOTE — Progress Notes (Signed)
CRITICAL VALUE ALERT  Critical Value:  Troponin 0.28  Date & Time Notied: 112/27/2018 1125  Provider Notified: Dr. Harrington Challenger  Orders Received/Actions taken:

## 2017-09-21 NOTE — H&P (View-Only) (Signed)
Progress Note  Patient Name: [name] Date of Encounter: 09/21/2017  Primary Cardiologist: No primary care provider on file.   Subjective   Chest pain eased off from last night.  On IV nitroglycerin and heparin.  No shortness of breath.  Wife in room.  Ready for cath.  Inpatient Medications    Scheduled Meds: . [START ON 09/22/2017] aspirin EC  81 mg Oral Daily  . sodium chloride flush  3 mL Intravenous Q12H   Continuous Infusions: . sodium chloride    . sodium chloride 50 mL/hr at 09/21/17 0757  . heparin 1,250 Units/hr (09/21/17 0549)  . nitroGLYCERIN 30 mcg/min (09/21/17 0700)   PRN Meds: sodium chloride, acetaminophen, morphine injection, nitroGLYCERIN, ondansetron (ZOFRAN) IV, sodium chloride flush   Vital Signs    Vitals:   09/21/17 0800 09/21/17 0837 09/21/17 0846 09/21/17 0900  BP: 95/71   111/67  Pulse: 77  81 80  Resp: 11  20 16   Temp:  97.9 F (36.6 C)    TempSrc:  Oral    SpO2: 94%  91% 91%  Weight:      Height:        Intake/Output Summary (Last 24 hours) at 09/21/2017 1014 Last data filed at 09/21/2017 0900 Gross per 24 hour  Intake 956.35 ml  Output -  Net 956.35 ml   Filed Weights   09/20/17 1024 09/20/17 1700  Weight: 183 lb (83 kg) 173 lb 15.1 oz (78.9 kg)    Telemetry    Sinus rhythm with occasional PVCs.- Personally Reviewed  ECG    Sinus rhythm, poor R wave progression, otherwise unremarkable- Personally Reviewed  Physical Exam   GEN: No acute distress.   Neck: No JVD Cardiac: RRR, no murmurs, rubs, or gallops.  Respiratory: Clear to auscultation bilaterally. GI: Soft, nontender, non-distended  MS: No edema; No deformity.  2+ radial pulse Neuro:  Nonfocal  Psych: Normal affect   Labs    Chemistry Recent Labs  Lab 09/20/17 1026 09/20/17 1825 09/21/17 0219  NA 137 139 135  K 4.1 4.2 3.9  CL 100* 105 100*  CO2 24 26 25   GLUCOSE 119* 103* 112*  BUN 18 12 16   CREATININE 0.90 0.85 0.99  CALCIUM 9.8 9.6 8.9   GFRNONAA >60 >60 >60  GFRAA >60 >60 >60  ANIONGAP 13 8 10      Hematology Recent Labs  Lab 09/20/17 1026 09/20/17 1825 09/21/17 0219  WBC 8.2 7.6 9.5  RBC 5.32 5.23 4.95  HGB 16.2 16.1 15.4  HCT 49.7 48.1 45.4  MCV 93.4 92.0 91.7  MCH 30.5 30.8 31.1  MCHC 32.6 33.5 33.9  RDW 12.8 13.1 13.1  PLT 246 231 219    Cardiac Enzymes Recent Labs  Lab 09/20/17 1418  TROPONINI 1.05*    Recent Labs  Lab 09/20/17 1034  TROPIPOC 0.08     BNPNo results for input(s): BNP, PROBNP in the last 168 hours.   DDimer No results for input(s): DDIMER in the last 168 hours.   Radiology    Dg Chest 2 View  Result Date: 09/20/2017 CLINICAL DATA:  Chest pain and shortness of breath.  Cough. EXAM: CHEST  2 VIEW COMPARISON:  05/21/2011 and 07/04/2006 FINDINGS: The heart size and mediastinal contours are within normal limits. Coronary stent. Both lungs are clear. The visualized skeletal structures are unremarkable. IMPRESSION: No acute abnormalities. Electronically Signed   By: Lorriane Shire M.D.   On: 09/20/2017 11:28    Cardiac Studies  Cardiac catheterization pending  Patient Profile     66 y.o. male with non-ST elevation myocardial infarction  Assessment & Plan    Non-ST elevation myocardial infarction -Cardiac catheterization.  IV heparin, IV nitro, aspirin, beta-blocker, low-dose, has had bradycardia at times.  Hopefully will be able to trial beta-blocker short-term during his MI.  Patient refuses statin therapy.  Refer to lipid clinic as outpatient.  CAD - Prior DES to LAD August 20 12.  Anterior STEMI at the time.  Probable bicuspid aortic valve  Frequent PVCs  - No changes.  Seen on telemetry.  Prior provoked DVT -Xarelto 5 months stopped due to hematuria.    For questions or updates, please contact [place] Please consult [email or web address] for contact info under Cardiology/STEMI.      Signed, Candee Furbish, MD  09/21/2017, 10:14 AM

## 2017-09-21 NOTE — Interval H&P Note (Signed)
Cath Lab Visit (complete for each Cath Lab visit)  Clinical Evaluation Leading to the Procedure:   ACS: Yes.    Non-ACS:    Anginal Classification: CCS III  Anti-ischemic medical therapy: Minimal Therapy (1 class of medications)  Non-Invasive Test Results: No non-invasive testing performed  Prior CABG: No previous CABG      History and Physical Interval Note:  09/21/2017 2:11 PM  [name]  has presented today for surgery, with the diagnosis of cp  The various methods of treatment have been discussed with the patient and family. After consideration of risks, benefits and other options for treatment, the patient has consented to  Procedure(s): LEFT HEART CATH AND CORONARY ANGIOGRAPHY (N/A) as a surgical intervention .  The patient's history has been reviewed, patient examined, no change in status, stable for surgery.  I have reviewed the patient's chart and labs.  Questions were answered to the patient's satisfaction.     Quay Burow

## 2017-09-21 NOTE — Progress Notes (Signed)
   09/21/17 2300  Clinical Encounter Type  Visited With Health care provider  Visit Type (Code Stemi)  Referral From Nurse  Consult/Referral To Chaplain   Responded to a page for a Code Stemi for an patient already in [place].  Nurse indicated she was calling the spouse and would let me know if they needed anything.   Will follow as needed. Chaplain Katherene Ponto

## 2017-09-21 NOTE — Progress Notes (Addendum)
Paged  For  Chest  Pain,  Recent PCI LCX Nitro gtt  Started  Along with morphine, with  Slight  Relief EKG- NSR, slight  v2 v3 st  Depression Will continue  To monitor   Reviewed  ekg   At  Bed side   And  I  evalluated  patietn His  Pain is  Still persisting  At  5/10   Despite  Nitro gtt  And  ekg  Shows  Concerning  Changes  , with ST  Elevation , upsloping in  Inferior  Leads.   Heparin Bolus given  Interventional cardiology on  Call paged  And  Fraser Din being  Taken  Emergently to  Cath  Lab

## 2017-09-21 NOTE — Care Management Note (Signed)
Case Management Note Marvetta Gibbons RN, BSN Unit 4E-Case Manager-- [place] coverage [phone]  Patient Details  Name: [name] MRN: [id number] Date of Birth: 05-14-1951  Subjective/Objective:  Pt admitted with NSTEMI- plan for Cardiac cath 09/21/17- remains on IV heparin, IV NTG                  Action/Plan: PTA pt lived at home with spouse- CM to follow   Expected Discharge Date:                  Expected Discharge Plan:  Home/Self Care  In-House Referral:     Discharge planning Services  CM Consult  Post Acute Care Choice:    Choice offered to:     DME Arranged:    DME Agency:     HH Arranged:    HH Agency:     Status of Service:  In process, will continue to follow  If discussed at Long Length of Stay Meetings, dates discussed:    Discharge Disposition:   Additional Comments:  Dawayne [name], RN 09/21/2017, 11:40 AM

## 2017-09-22 ENCOUNTER — Encounter (HOSPITAL_COMMUNITY): Payer: Self-pay | Admitting: Cardiology

## 2017-09-22 DIAGNOSIS — I252 Old myocardial infarction: Secondary | ICD-10-CM | POA: Diagnosis not present

## 2017-09-22 DIAGNOSIS — E782 Mixed hyperlipidemia: Secondary | ICD-10-CM | POA: Diagnosis present

## 2017-09-22 DIAGNOSIS — Q231 Congenital insufficiency of aortic valve: Secondary | ICD-10-CM | POA: Diagnosis not present

## 2017-09-22 DIAGNOSIS — Z91048 Other nonmedicinal substance allergy status: Secondary | ICD-10-CM | POA: Diagnosis not present

## 2017-09-22 DIAGNOSIS — Z7982 Long term (current) use of aspirin: Secondary | ICD-10-CM | POA: Diagnosis not present

## 2017-09-22 DIAGNOSIS — Z87891 Personal history of nicotine dependence: Secondary | ICD-10-CM | POA: Diagnosis not present

## 2017-09-22 DIAGNOSIS — I739 Peripheral vascular disease, unspecified: Secondary | ICD-10-CM | POA: Diagnosis present

## 2017-09-22 DIAGNOSIS — I10 Essential (primary) hypertension: Secondary | ICD-10-CM | POA: Diagnosis present

## 2017-09-22 DIAGNOSIS — I214 Non-ST elevation (NSTEMI) myocardial infarction: Secondary | ICD-10-CM | POA: Diagnosis present

## 2017-09-22 DIAGNOSIS — Z86718 Personal history of other venous thrombosis and embolism: Secondary | ICD-10-CM | POA: Diagnosis not present

## 2017-09-22 DIAGNOSIS — I2510 Atherosclerotic heart disease of native coronary artery without angina pectoris: Secondary | ICD-10-CM | POA: Diagnosis present

## 2017-09-22 DIAGNOSIS — Z8546 Personal history of malignant neoplasm of prostate: Secondary | ICD-10-CM | POA: Diagnosis not present

## 2017-09-22 DIAGNOSIS — Z806 Family history of leukemia: Secondary | ICD-10-CM | POA: Diagnosis not present

## 2017-09-22 DIAGNOSIS — Z8249 Family history of ischemic heart disease and other diseases of the circulatory system: Secondary | ICD-10-CM | POA: Diagnosis not present

## 2017-09-22 DIAGNOSIS — Z9079 Acquired absence of other genital organ(s): Secondary | ICD-10-CM | POA: Diagnosis not present

## 2017-09-22 DIAGNOSIS — Z888 Allergy status to other drugs, medicaments and biological substances status: Secondary | ICD-10-CM | POA: Diagnosis not present

## 2017-09-22 LAB — BASIC METABOLIC PANEL
ANION GAP: 7 (ref 5–15)
BUN: 16 mg/dL (ref 6–20)
CALCIUM: 8.5 mg/dL — AB (ref 8.9–10.3)
CO2: 23 mmol/L (ref 22–32)
CREATININE: 0.77 mg/dL (ref 0.61–1.24)
Chloride: 108 mmol/L (ref 101–111)
Glucose, Bld: 99 mg/dL (ref 65–99)
Potassium: 4.1 mmol/L (ref 3.5–5.1)
SODIUM: 138 mmol/L (ref 135–145)

## 2017-09-22 LAB — CBC
HCT: 42.7 % (ref 39.0–52.0)
Hemoglobin: 14.2 g/dL (ref 13.0–17.0)
MCH: 30.7 pg (ref 26.0–34.0)
MCHC: 33.3 g/dL (ref 30.0–36.0)
MCV: 92.4 fL (ref 78.0–100.0)
PLATELETS: 205 10*3/uL (ref 150–400)
RBC: 4.62 MIL/uL (ref 4.22–5.81)
RDW: 13.2 % (ref 11.5–15.5)
WBC: 8.3 10*3/uL (ref 4.0–10.5)

## 2017-09-22 MED ORDER — VERAPAMIL HCL 2.5 MG/ML IV SOLN
INTRAVENOUS | Status: AC
  Filled 2017-09-22: qty 2

## 2017-09-22 MED ORDER — SODIUM CHLORIDE 0.9% FLUSH
3.0000 mL | Freq: Two times a day (BID) | INTRAVENOUS | Status: DC
  Administered 2017-09-22 – 2017-09-23 (×3): 3 mL via INTRAVENOUS

## 2017-09-22 MED ORDER — SODIUM CHLORIDE 0.9 % WEIGHT BASED INFUSION
1.0000 mL/kg/h | INTRAVENOUS | Status: AC
  Administered 2017-09-22 (×2): 1 mL/kg/h via INTRAVENOUS

## 2017-09-22 MED ORDER — SODIUM CHLORIDE 0.9% FLUSH: 3.0000 mL | INTRAVENOUS | Status: DC | PRN

## 2017-09-22 MED ORDER — HEPARIN (PORCINE) IN NACL 100-0.45 UNIT/ML-% IJ SOLN
1250.0000 [IU]/h | INTRAMUSCULAR | Status: DC
  Administered 2017-09-22: 1250 [IU]/h via INTRAVENOUS

## 2017-09-22 MED ORDER — LIDOCAINE HCL (PF) 1 % IJ SOLN
INTRAMUSCULAR | Status: AC
  Filled 2017-09-22: qty 30

## 2017-09-22 MED ORDER — IOPAMIDOL (ISOVUE-370) INJECTION 76%
INTRAVENOUS | Status: AC
  Filled 2017-09-22: qty 100

## 2017-09-22 MED ORDER — HEPARIN (PORCINE) IN NACL 2-0.9 UNIT/ML-% IJ SOLN
INTRAMUSCULAR | Status: AC
  Filled 2017-09-22: qty 1000

## 2017-09-22 MED ORDER — HEPARIN (PORCINE) IN NACL 100-0.45 UNIT/ML-% IJ SOLN
1250.0000 [IU]/h | INTRAMUSCULAR | Status: DC
  Administered 2017-09-22: 1250 [IU]/h via INTRAVENOUS
  Filled 2017-09-22 (×2): qty 250

## 2017-09-22 MED ORDER — METOPROLOL TARTRATE 25 MG PO TABS
25.0000 mg | ORAL_TABLET | Freq: Two times a day (BID) | ORAL | Status: DC
Start: 2017-09-22 — End: 2017-09-23
  Administered 2017-09-22 – 2017-09-23 (×3): 25 mg via ORAL
  Filled 2017-09-22 (×4): qty 1

## 2017-09-22 MED ORDER — SODIUM CHLORIDE 0.9 % IV SOLN: 250.0000 mL | INTRAVENOUS | Status: DC | PRN

## 2017-09-22 NOTE — Progress Notes (Signed)
Pt coming back from cath lab. Cath lab RN said heparin, Nitro and TR band would remain on over night. Will continue to monitor.

## 2017-09-22 NOTE — Progress Notes (Signed)
Pt complains of 6/10 chest pain and shortness of breath. Oxygen was applied along with morphine and nitroglycerin gtt started. Cardiology notified and is coming to the bedside to evaluate patient. Will continue to monitor.

## 2017-09-22 NOTE — Progress Notes (Addendum)
Per insurance check for Brilinta   # 7. /SW NICEIE  @ [employer] # [phone] OPT-2   BRILINTA 90 MG BID   COVER- YES  CO-PAY- $ 90.00 OR 44 % OF TOTAL COAST  TIER- 3 DRUG  PRIOR APPROVAL- NO   PREFERRED PHARMACY : [place], [facility]    CM spoke with pt at bedside- to let him know copay cost- per pt he does not want Brilinta and would prefer Plavix which per pt cost $10- pt to speak with MD.

## 2017-09-22 NOTE — Progress Notes (Signed)
CARDIAC REHAB PHASE I   PRE:  Rate/Rhythm: 63 SR PVC  BP:  Supine: 138/74  Sitting:   Standing:    SaO2: 99%RA  MODE:  Ambulation: 740 ft   POST:  Rate/Rhythm: 76 SR  BP:  Supine: 117/72  Sitting:   Standing:    SaO2: 99%RA 1320-1427 Pt walked 740 ft on RA with steady gait and no CP. Tolerated well. MI education completed with pt who voiced understanding. Stressed importance of brilinta with stent. Pt stated he does not want to be on brilinta . Would prefer to be on plavix. Pt also told me he takes a lot of aspirin. Up to 1000 mg a day. Discussed with him that with antiplatelets we usually have pts on 81mg  and that there is risk of bleeding which that much aspirin. He is to discuss with cardiologist not wanting to take brilinta.  Reviewed MI restrictions, NTG use, risk factors, ex ed and CRP 2. Pt is very active, and prefers to ex on his own. Will refer to [place] CRP 2 but pt does not want to attend. Needs to see case manager so he will know how much brilinta would cost if he chooses to continue with it. Left heart healthy diet for pt to read. He tries to maintain a healthy diet.   Kyle Good, RN BSN  09/22/2017 2:23 PM

## 2017-09-22 NOTE — Progress Notes (Signed)
Follow up EKG shows ST elevation. Patient still complaining of chest pain. Cardiology at bedside. Activating Code STEMI. Will continue to monitor.

## 2017-09-22 NOTE — Progress Notes (Signed)
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 Adhesive [Tape] Hives    Hives  And itching from the adhesive on the pads of the heart monitor   . Demerol [Meperidine] Nausea Only    Drop in blood pressure  Muscle spasm  . Fentanyl Nausea Only    Drop in blood pressure Muscle spasm    Patient Measurements: Height: 5\' 6"  (167.6 cm) Weight: 173 lb 15.1 oz (78.9 kg) IBW/kg (Calculated) : 63.8  Vital Signs: Temp: 98.5 F (36.9 C) (12/27 1924) Temp Source: Oral (12/27 1924) BP: 114/76 (12/27 2315) Pulse Rate: 74 (12/27 2315)  Labs: Recent Labs    09/20/17 1026 09/20/17 1418 09/20/17 1419 09/20/17 1811 09/20/17 1825 09/21/17 0219 09/21/17 0950 09/21/17 2222  HGB 16.2  --   --   --  16.1 15.4  --   --   HCT 49.7  --   --   --  48.1 45.4  --   --   PLT 246  --   --   --  231 219  --   --   LABPROT 11.4  --   --   --  12.8  --   --   --   INR 0.84  --   --   --  0.97  --   --   --   HEPARINUNFRC  --   --  0.28* 0.15*  --  0.34  --   --   CREATININE 0.90  --   --   --  0.85 0.99  --   --   CKTOTAL  --   --   --   --   --   --   --  59  CKMB  --   --   --   --   --   --   --  2.4  TROPONINI  --  1.05*  --   --   --   --  0.28* 0.31*    Estimated Creatinine Clearance: 71.5 mL/min (by C-G formula based on SCr of 0.99 mg/dL).   Medical History: Past Medical History:  Diagnosis Date  . Abnormality of aortic valve    a. possibly bicuspid.  . Asthma    Childhood  . Carotid artery disease ([place])    a. 1-39% bilaterally in 2017.  Marland Kitchen Coronary artery disease    a. anterior STEMI 04/2011, s/p DES to LAD.  Marland Kitchen DVT (deep venous thrombosis) (H[facility])    a. tx with Xarelto x 5 months, stopped due to hematuria.  . Frequent PVCs    a. seen on Holter monitor.  . Hyperlipidemia, mixed   . Hypertension   . Myocardial infarction ([place]) 05/21/2011   anterior wall, treated by Dr. [name] with 3.0x64mm Promus drug eluting stent to proximal LAD   . Peripheral arterial disease ([facility])    moderate left ICA disease, mild right ICA disease  . Prostate cancer ([place]) 2007  . Sinus bradycardia    a. HR 50s at home.  . Statin intolerance     Medications:  Medications Prior to Admission  Medication Sig Dispense Refill Last Dose  . amLODipine (NORVASC) 2.5 MG tablet TAKE (1) TABLET BY MOUTH ONCE DAILY. 30 tablet 3 09/20/2017 at Unknown time  . aspirin 81 MG tablet Take 162 mg by mouth daily.    09/20/2017 at Unknown time  .  Coenzyme Q10 (CO Q-10) 100 MG CAPS Take 1 capsule by mouth daily.   09/20/2017 at Unknown time  . losartan (COZAAR) 25 MG tablet TAKE ONE TABLET BY MOUTH ONCE DAILY. 30 tablet 3 09/20/2017 at Unknown time  . metoprolol tartrate (LOPRESSOR) 25 MG tablet Take 1 tablet (25 mg total) by mouth 2 (two) times daily. 180 tablet 1 09/20/2017 at Unknown time  . NITROSTAT 0.4 MG SL tablet PLACE 1 UNDER TONGUE EVERY 5 MINUTES UP TO 3 DOSES AS NEEDED FOR CHEST PAIN. (Patient taking differently: took 2 doses today) 25 tablet 3 09/20/2017 at Unknown time   Scheduled:  . ALPRAZolam  0.5 mg Oral Once  . aspirin  81 mg Oral Daily  . lidocaine  100 mg Intravenous Once  . metoprolol tartrate  25 mg Oral BID  . sodium chloride flush  3 mL Intravenous Q12H  . sodium chloride flush  3 mL Intravenous Q12H  . ticagrelor  90 mg Oral BID   Infusions:  . sodium chloride    . sodium chloride    . sodium chloride    . sodium chloride 1 mL/kg/hr (09/22/17 0039)  . [COMPLETED] heparin    . 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  Goal of Therapy:  Heparin level 0.3-0.7 units/ml Monitor platelets by anticoagulation  protocol: Yes   Plan:  Cards resumed heparin at prior rate of 1250 units/hr in cath lab; confirmed w/ Dr [name] that heparin to continue immediately post-cath.  Will monitor heparin levels and CBC.  Wynona Neat, PharmD, BCPS  09/22/2017,12:48 AM

## 2017-09-22 NOTE — Progress Notes (Signed)
Progress Note  Patient Name: [name] Date of Encounter: 09/22/2017  Primary Cardiologist: [place] much better no chest pain   Inpatient Medications    Scheduled Meds: . ALPRAZolam  0.5 mg Oral Once  . aspirin  81 mg Oral Daily  . lidocaine  100 mg Intravenous Once  . metoprolol tartrate  25 mg Oral BID  . sodium chloride flush  3 mL Intravenous Q12H  . sodium chloride flush  3 mL Intravenous Q12H  . ticagrelor  90 mg Oral BID   Continuous Infusions: . sodium chloride    . sodium chloride    . sodium chloride 1 mL/kg/hr (09/22/17 0800)  . [COMPLETED] heparin Stopped (09/22/17 0745)  . nitroGLYCERIN 10 mcg/min (09/22/17 0745)   PRN Meds: sodium chloride, sodium chloride, acetaminophen, morphine injection, nitroGLYCERIN, ondansetron (ZOFRAN) IV, sodium chloride flush, sodium chloride flush   Vital Signs    Vitals:   09/22/17 0500 09/22/17 0600 09/22/17 0700 09/22/17 0800  BP: 122/78 122/60 111/64 117/63  Pulse: 65 67 76 64  Resp: 15 12 (!) 22 15  Temp:    98.5 F (36.9 C)  TempSrc:    Oral  SpO2: 94% 94% 97% 95%  Weight:      Height:        Intake/Output Summary (Last 24 hours) at 09/22/2017 0826 Last data filed at 09/22/2017 0800 Gross per 24 hour  Intake 1932.16 ml  Output 1075 ml  Net 857.16 ml   Filed Weights   09/20/17 1024 09/20/17 1700  Weight: 183 lb (83 kg) 173 lb 15.1 oz (78.9 kg)    Telemetry    Sinus rhythm with occasional PVCs.- Personally Reviewed  ECG    Sinus rhythm, poor R wave progression, otherwise unremarkable- Personally Reviewed  Physical Exam   BP 117/63 (BP Location: Left Arm)   Pulse 64   Temp 98.5 F (36.9 C) (Oral)   Resp 15   Ht 5\' 6"  (1.676 m)   Wt 173 lb 15.1 oz (78.9 kg)   SpO2 95%   BMI 28.08 kg/m  Affect appropriate Healthy:  appears stated age HEENT: normal Neck supple with no adenopathy JVP normal no bruits no thyromegaly Lungs clear with no wheezing and good diaphragmatic  motion Heart:  S1/S2 SEM  murmur, no rub, gallop or click PMI normal Abdomen: benighn, BS positve, no tenderness, no AAA no bruit.  No HSM or HJR Distal pulses intact with no bruits No edema Neuro non-focal Skin warm and dry No muscular weakness Radial band on right wrist good pulse   Labs    Chemistry Recent Labs  Lab 09/20/17 1825 09/21/17 0219 09/22/17 0330  NA 139 135 138  K 4.2 3.9 4.1  CL 105 100* 108  CO2 26 25 23   GLUCOSE 103* 112* 99  BUN 12 16 16   CREATININE 0.85 0.99 0.77  CALCIUM 9.6 8.9 8.5*  GFRNONAA >60 >60 >60  GFRAA >60 >60 >60  ANIONGAP 8 10 7      Hematology Recent Labs  Lab 09/20/17 1825 09/21/17 0219 09/22/17 0330  WBC 7.6 9.5 8.3  RBC 5.23 4.95 4.62  HGB 16.1 15.4 14.2  HCT 48.1 45.4 42.7  MCV 92.0 91.7 92.4  MCH 30.8 31.1 30.7  MCHC 33.5 33.9 33.3  RDW 13.1 13.1 13.2  PLT 231 219 205    Cardiac Enzymes Recent Labs  Lab 09/20/17 1418 09/21/17 0950 09/21/17 2222  TROPONINI 1.05* 0.28* 0.31*    Recent Labs  Lab 09/20/17 1034  TROPIPOC 0.08     BNPNo results for input(s): BNP, PROBNP in the last 168 hours.   DDimer No results for input(s): DDIMER in the last 168 hours.   Radiology    Dg Chest 2 View  Result Date: 09/20/2017 CLINICAL DATA:  Chest pain and shortness of breath.  Cough. EXAM: CHEST  2 VIEW COMPARISON:  05/21/2011 and 07/04/2006 FINDINGS: The heart size and mediastinal contours are within normal limits. Coronary stent. Both lungs are clear. The visualized skeletal structures are unremarkable. IMPRESSION: No acute abnormalities. Electronically Signed   By: Lorriane Shire M.D.   On: 09/20/2017 11:28    Cardiac Studies     Patient Profile     66 y.o. male with non-ST elevation myocardial infarction Cath 12/27 with new stent to mid circumflex patent stent to proximal LAD. Had recurrent chest pain evening of 12/27 and noted To have wire dissection in OM. Rx conservatively with decreasing troponin    Assessment & Plan    Non-ST elevation myocardial infarction - stent to circumflex with wire dissection in OM Discussed with Dr [name] D/C heparin Ambulate if no chest pain can d/c in am continue beta blocker and DAT   Patient refuses statin therapy.  Refer to lipid clinic as outpatient.  CAD - Prior DES to LAD August 20 12.  Anterior STEMI at the time.  Probable bicuspid aortic valve would do f/u outpatient echo to further evaluate   Frequent PVCs  - improved post PCI NSR today   Prior provoked DVT -Xarelto 5 months stopped due to hematuria.    For questions or updates, please contact [place] Please consult [email or web address] for contact info under Cardiology/STEMI.      Signed, Jenkins Rouge, MD  09/22/2017, 8:26 AM

## 2017-09-22 NOTE — Progress Notes (Signed)
Patient refuses to have family notified about emergent cath.

## 2017-09-23 ENCOUNTER — Other Ambulatory Visit: Payer: Self-pay | Admitting: Cardiology

## 2017-09-23 LAB — BASIC METABOLIC PANEL
Anion gap: 11 (ref 5–15)
BUN: 13 mg/dL (ref 6–20)
CALCIUM: 9.1 mg/dL (ref 8.9–10.3)
CO2: 23 mmol/L (ref 22–32)
CREATININE: 0.82 mg/dL (ref 0.61–1.24)
Chloride: 105 mmol/L (ref 101–111)
Glucose, Bld: 72 mg/dL (ref 65–99)
Potassium: 3.9 mmol/L (ref 3.5–5.1)
SODIUM: 139 mmol/L (ref 135–145)

## 2017-09-23 LAB — CBC
HCT: 46.2 % (ref 39.0–52.0)
Hemoglobin: 15.4 g/dL (ref 13.0–17.0)
MCH: 31 pg (ref 26.0–34.0)
MCHC: 33.3 g/dL (ref 30.0–36.0)
MCV: 93 fL (ref 78.0–100.0)
PLATELETS: 202 10*3/uL (ref 150–400)
RBC: 4.97 MIL/uL (ref 4.22–5.81)
RDW: 13.3 % (ref 11.5–15.5)
WBC: 9.3 10*3/uL (ref 4.0–10.5)

## 2017-09-23 MED ORDER — CLOPIDOGREL BISULFATE 75 MG PO TABS: 75.0000 mg | ORAL_TABLET | Freq: Every day | ORAL | 3 refills | Status: DC

## 2017-09-23 MED ORDER — CLOPIDOGREL BISULFATE 300 MG PO TABS
300.0000 mg | ORAL_TABLET | Freq: Once | ORAL | Status: AC
Start: 2017-09-23 — End: 2017-09-23
  Administered 2017-09-23: 300 mg via ORAL
  Filled 2017-09-23: qty 1

## 2017-09-23 MED ORDER — CLOPIDOGREL BISULFATE 75 MG PO TABS: 75.0000 mg | ORAL_TABLET | Freq: Every day | ORAL | Status: DC

## 2017-09-23 MED ORDER — LOSARTAN POTASSIUM 25 MG PO TABS
25.0000 mg | ORAL_TABLET | Freq: Every day | ORAL | Status: DC
  Administered 2017-09-23: 25 mg via ORAL
  Filled 2017-09-23: qty 1

## 2017-09-23 MED ORDER — ASPIRIN 81 MG PO CHEW: 81.0000 mg | CHEWABLE_TABLET | Freq: Every day | ORAL | Status: DC

## 2017-09-23 MED ORDER — ACETAMINOPHEN 325 MG PO TABS: 650.0000 mg | ORAL_TABLET | ORAL | Status: DC | PRN

## 2017-09-23 NOTE — Plan of Care (Signed)
Pt denied experiencing pain after experiencing Chest Pain intermittently during his stay in the hospital.

## 2017-09-23 NOTE — Progress Notes (Addendum)
DAILY PROGRESS NOTE   Patient Name: [name] Date of Encounter: 09/23/2017  Chief Complaint   Wants to go home  Patient Profile   66 y.o. male with non-ST elevation myocardial infarction Cath 12/27 with new stent to mid circumflex patent stent to proximal LAD. Had recurrent chest pain evening of 12/27 and noted To have wire dissection in OM. Rx conservatively with decreasing troponin   Subjective   No chest pain overnight. Ambulated with CR yesterday. Labs stable. Concerned about the cost of Brillinta - would prefer Plavix. Not interested in cardiac rehab - he does his own exercise. Has not been able to tolerate statins - will not take an injectable medicine.  Objective   Vitals:   09/23/17 0400 09/23/17 0510 09/23/17 0600 09/23/17 0700  BP: 121/81 131/88 123/67 125/65  Pulse: 62 (!) 59 65 66  Resp: _0 Temp: 98.8 F (37.1 C)   98.5 F (36.9 C)  TempSrc: Oral   Oral  SpO2: 95% 97% 97% 100%  Weight:      Height:        Intake/Output Summary (Last 24 hours) at 09/23/2017 0804 Last data filed at 09/23/2017 0300 Gross per 24 hour  Intake 321.9 ml  Output 900 ml  Net -578.1 ml   Filed Weights   09/20/17 1024 09/20/17 1700  Weight: 183 lb (83 kg) 173 lb 15.1 oz (78.9 kg)    Physical Exam   General appearance: alert and no distress Lungs: clear to auscultation bilaterally Heart: regular rate and rhythm, S1, S2 normal, no murmur, click, rub or gallop Extremities: extremities normal, atraumatic, no cyanosis or edema Neurologic: Grossly normal  Inpatient Medications    Scheduled Meds: . ALPRAZolam  0.5 mg Oral Once  . aspirin  81 mg Oral Daily  . lidocaine  100 mg Intravenous Once  . metoprolol tartrate  25 mg Oral BID  . sodium chloride flush  3 mL Intravenous Q12H  . sodium chloride flush  3 mL Intravenous Q12H  . ticagrelor  90 mg Oral BID    Continuous Infusions: . sodium chloride    . sodium chloride    . nitroGLYCERIN Stopped  (09/22/17 0900)    PRN Meds: sodium chloride, sodium chloride, acetaminophen, morphine injection, nitroGLYCERIN, ondansetron (ZOFRAN) IV, sodium chloride flush, sodium chloride flush   Labs   Results for orders placed or performed during the hospital encounter of 09/20/17 (from the past 48 hour(s))  Troponin I     Status: Abnormal   Collection Time: 09/21/17  9:50 AM  Result Value Ref Range   Troponin I 0.28 (HH) <0.03 ng/mL    Comment: CRITICAL RESULT CALLED TO, READ BACK BY AND VERIFIED WITH: J.ADAMS,RN 1123 09/21/17 CLARK,S   POCT Activated clotting time     Status: None   Collection Time: 09/21/17  2:52 PM  Result Value Ref Range   Activated Clotting Time 428 seconds  Troponin I     Status: Abnormal   Collection Time: 09/21/17 10:22 PM  Result Value Ref Range   Troponin I 0.31 (HH) <0.03 ng/mL    Comment: CRITICAL VALUE NOTED.  VALUE IS CONSISTENT WITH PREVIOUSLY REPORTED AND CALLED VALUE.  CK total and CKMB (cardiac)not at [facility]     Status: None   Collection Time: 09/21/17 10:22 PM  Result Value Ref Range   Total CK 59 49 - 397 U/L   CK, MB 2.4 0.5 - 5.0 ng/mL   Relative Index RELATIVE INDEX IS  INVALID 0.0 - 2.5    Comment: WHEN CK < 100 U/L          CBC     Status: None   Collection Time: 09/22/17  3:30 AM  Result Value Ref Range   WBC 8.3 4.0 - 10.5 K/uL   RBC 4.62 4.22 - 5.81 MIL/uL   Hemoglobin 14.2 13.0 - 17.0 g/dL   HCT 42.7 39.0 - 52.0 %   MCV 92.4 78.0 - 100.0 fL   MCH 30.7 26.0 - 34.0 pg   MCHC 33.3 30.0 - 36.0 g/dL   RDW 13.2 11.5 - 15.5 %   Platelets 205 150 - 400 K/uL  Basic metabolic panel     Status: Abnormal   Collection Time: 09/22/17  3:30 AM  Result Value Ref Range   Sodium 138 135 - 145 mmol/L   Potassium 4.1 3.5 - 5.1 mmol/L   Chloride 108 101 - 111 mmol/L   CO2 23 22 - 32 mmol/L   Glucose, Bld 99 65 - 99 mg/dL   BUN 16 6 - 20 mg/dL   Creatinine, Ser 0.77 0.61 - 1.24 mg/dL   Calcium 8.5 (L) 8.9 - 10.3 mg/dL   GFR calc non Af Amer >60  >60 mL/min   GFR calc Af Amer >60 >60 mL/min    Comment: (NOTE) The eGFR has been calculated using the CKD EPI equation. This calculation has not been validated in all clinical situations. eGFR's persistently <60 mL/min signify possible Chronic Kidney Disease.    Anion gap 7 5 - 15  CBC     Status: None   Collection Time: 09/23/17  3:37 AM  Result Value Ref Range   WBC 9.3 4.0 - 10.5 K/uL   RBC 4.97 4.22 - 5.81 MIL/uL   Hemoglobin 15.4 13.0 - 17.0 g/dL   HCT 46.2 39.0 - 52.0 %   MCV 93.0 78.0 - 100.0 fL   MCH 31.0 26.0 - 34.0 pg   MCHC 33.3 30.0 - 36.0 g/dL   RDW 13.3 11.5 - 15.5 %   Platelets 202 150 - 400 K/uL  Basic metabolic panel     Status: None   Collection Time: 09/23/17  3:37 AM  Result Value Ref Range   Sodium 139 135 - 145 mmol/L   Potassium 3.9 3.5 - 5.1 mmol/L   Chloride 105 101 - 111 mmol/L   CO2 23 22 - 32 mmol/L   Glucose, Bld 72 65 - 99 mg/dL   BUN 13 6 - 20 mg/dL   Creatinine, Ser 0.82 0.61 - 1.24 mg/dL   Calcium 9.1 8.9 - 10.3 mg/dL   GFR calc non Af Amer >60 >60 mL/min   GFR calc Af Amer >60 >60 mL/min    Comment: (NOTE) The eGFR has been calculated using the CKD EPI equation. This calculation has not been validated in all clinical situations. eGFR's persistently <60 mL/min signify possible Chronic Kidney Disease.    Anion gap 11 5 - 15    ECG   N/A  Telemetry   NSR with PVC's - Personally Reviewed  Radiology    No results found.  Cardiac Studies   N/A  Assessment   1. Active Problems: 2.   CAD (coronary artery disease) 3.   HTN (hypertension) 4.   Mixed hyperlipidemia 5.   Chest pain 6.   NSTEMI (non-ST elevated myocardial infarction) ([place]) 7.   ST elevation myocardial infarction involving left circumflex coronary artery ([place]) 8.   STEMI (ST elevation myocardial  infarction) ([place]) 9.   Plan   1. Ok to d/c home today. Will switch Brillinta to Plavix - 300 mg load today, then 75 mg daily starting tomorrow. Follow-up with Dr.  Harl Bowie in [place]. Will be happy to see him in lipid clinic to further discuss ways we can decrease his risk given his statin intolerance. BP improved -probably an ischemic component. Ok to restart losartan 25 mg daily, but would not restart amlodipine. May need to decrease doses at follow-up.  Time Spent Directly with Patient:  I have spent a total of 15 minutes with the patient reviewing hospital notes, telemetry, EKGs, labs and examining the patient as well as establishing an assessment and plan that was discussed personally with the patient. > 50% of time was spent in direct patient care.  Length of Stay:  LOS: 1 day   Pixie Casino, MD, [facility], [place] Director of the Advanced Lipid Disorders &  Cardiovascular Risk Reduction Clinic Attending Cardiologist  Direct Dial: [phone]  Fax: [phone]  Website:  [email or web address] 09/23/2017, 8:04 AM

## 2017-09-23 NOTE — Discharge Summary (Signed)
Discharge Summary    Patient ID: [name],  MRN: [id number], DOB/AGE: [birth year] 66 y.o.  Admit date: 09/20/2017 Discharge date: 09/23/2017  Primary Care Provider: [name] Primary Cardiologist: Dr Harl Bowie  Discharge Diagnoses    Principal Problem:   Chest pain Active Problems:   CAD S/P percutaneous coronary angioplasty   HTN (hypertension)   Mixed hyperlipidemia   History of DVT (deep vein thrombosis)   NSTEMI (non-ST elevated myocardial infarction) (HCC)   Allergies Allergies  Allergen Reactions  . Adhesive [Tape] Hives    Hives  And itching from the adhesive on the pads of the heart monitor   . Demerol [Meperidine] Nausea Only    Drop in blood pressure  Muscle spasm  . Fentanyl Nausea Only    Drop in blood pressure Muscle spasm    Diagnostic Studies/Procedures    Cath PCI- 09/21/17 Cath - 09/21/17 pm _____________   History of Present Illness     66 y/o with known CAD admitted with chest pain, elevated Troponin.  Hospital Course      66 y.o. male from [place]  with a hx of CAD (anterior STEMI 04/2011, s/p DES to LAD), probably bicuspid aortic valve, HTN, HLD (intolerant of statins, not interested in alternatives), frequent PVCs by prior 48-hour monitor, prior provoked DVT (treated with Xarelto x 5 months, stopped due to hematuria), mild carotid artery disease, and prostate CA who presented to [facility] ED 09/21/17 with chest pain and elevated Troponin (peak 0.31). He was transferred to [facility] for further evaluation. Cath done  12/27 by Dr [name] revealed a patent LAD stent with a mLAD 50%, 60% mCFX, and 60% PDA. The CFX lesion was significant by FFR and he underwent PCI with DES. Later that night he had recurrent chest pain and was taken back to the lab by Dr [name]. Cath revealed patent CFX stent with dissection in the OM3 with TIM 3 flow. Plan is for medical Rx. Dr [name] was concerned that attempt at recrossing with a wire could lead to subintimal  selection and worsening of the dissection. The pt was seen on 09/22/17 by Dr Debara Pickett and felt to be stable for discharge.    ____________  Discharge Vitals Blood pressure 127/73, pulse (!) 57, temperature 98.5 F (36.9 C), temperature source Oral, resp. rate 15, height 5\' 6"  (1.676 m), weight 173 lb 15.1 oz (78.9 kg), SpO2 96 %.  Filed Weights   09/20/17 1024 09/20/17 1700  Weight: 183 lb (83 kg) 173 lb 15.1 oz (78.9 kg)    Labs & Radiologic Studies    CBC Recent Labs    09/22/17 0330 09/23/17 0337  WBC 8.3 9.3  HGB 14.2 15.4  HCT 42.7 46.2  MCV 92.4 93.0  PLT 205 025   Basic Metabolic Panel Recent Labs    09/22/17 0330 09/23/17 0337  NA 138 139  K 4.1 3.9  CL 108 105  CO2 23 23  GLUCOSE 99 72  BUN 16 13  CREATININE 0.77 0.82  CALCIUM 8.5* 9.1   Liver Function Tests No results for input(s): AST, ALT, ALKPHOS, BILITOT, PROT, ALBUMIN in the last 72 hours. No results for input(s): LIPASE, AMYLASE in the last 72 hours. Cardiac Enzymes Recent Labs    09/20/17 1418 09/21/17 0950 09/21/17 2222  CKTOTAL  --   --  59  CKMB  --   --  2.4  TROPONINI 1.05* 0.28* 0.31*   BNP Invalid input(s): POCBNP D-Dimer No results for input(s): DDIMER  in the last 72 hours. Hemoglobin A1C No results for input(s): HGBA1C in the last 72 hours. Fasting Lipid Panel No results for input(s): CHOL, HDL, LDLCALC, TRIG, CHOLHDL, LDLDIRECT in the last 72 hours. Thyroid Function Tests No results for input(s): TSH, T4TOTAL, T3FREE, THYROIDAB in the last 72 hours.  Invalid input(s): FREET3 _____________  Dg Chest 2 View  Result Date: 09/20/2017 CLINICAL DATA:  Chest pain and shortness of breath.  Cough. EXAM: CHEST  2 VIEW COMPARISON:  05/21/2011 and 07/04/2006 FINDINGS: The heart size and mediastinal contours are within normal limits. Coronary stent. Both lungs are clear. The visualized skeletal structures are unremarkable. IMPRESSION: No acute abnormalities. Electronically Signed   By:  Lorriane Shire M.D.   On: 09/20/2017 11:28   Disposition   Pt is being discharged home today in good condition.  Follow-up Plans & Appointments     Discharge Instructions    AMB Referral to Cardiac Rehabilitation - Phase II   Complete by:  As directed    Diagnosis:  NSTEMI   Amb Referral to Cardiac Rehabilitation   Complete by:  As directed    Diagnosis:   NSTEMI Coronary Stents        Discharge Medications   Allergies as of 09/23/2017      Reactions   Adhesive [tape] Hives   Hives  And itching from the adhesive on the pads of the heart monitor    Demerol [meperidine] Nausea Only   Drop in blood pressure  Muscle spasm   Fentanyl Nausea Only   Drop in blood pressure Muscle spasm      Medication List    STOP taking these medications   aspirin 81 MG tablet Replaced by:  aspirin 81 MG chewable tablet     TAKE these medications   acetaminophen 325 MG tablet Commonly known as:  TYLENOL Take 2 tablets (650 mg total) by mouth every 4 (four) hours as needed for headache or mild pain.   amLODipine 2.5 MG tablet Commonly known as:  NORVASC TAKE (1) TABLET BY MOUTH ONCE DAILY.   aspirin 81 MG chewable tablet Chew 1 tablet (81 mg total) by mouth daily. Start taking on:  09/24/2017 Replaces:  aspirin 81 MG tablet   clopidogrel 75 MG tablet Commonly known as:  PLAVIX Take 1 tablet (75 mg total) by mouth daily. Start taking on:  09/24/2017   Co Q-10 100 MG Caps Take 1 capsule by mouth daily.   losartan 25 MG tablet Commonly known as:  COZAAR TAKE ONE TABLET BY MOUTH ONCE DAILY.   metoprolol tartrate 25 MG tablet Commonly known as:  LOPRESSOR Take 1 tablet (25 mg total) by mouth 2 (two) times daily.   NITROSTAT 0.4 MG SL tablet Generic drug:  nitroGLYCERIN PLACE 1 UNDER TONGUE EVERY 5 MINUTES UP TO 3 DOSES AS NEEDED FOR CHEST PAIN. What changed:  See the new instructions.        Aspirin prescribed at discharge?  Yes High Intensity Statin Prescribed?  (Lipitor 40-80mg  or Crestor 20-40mg ): No: pt statin intolerant Beta Blocker Prescribed? Yes For EF <40%, was ACEI/ARB Prescribed? No: NA ADP Receptor Inhibitor Prescribed? (i.e. Plavix etc.-Includes Medically Managed Patients): Yes For EF <40%, Aldosterone Inhibitor Prescribed? No: NA Was EF assessed during THIS hospitalization? Yes Was Cardiac Rehab II ordered? (Included Medically managed Patients): Yes   Outstanding Labs/Studies     Duration of Discharge Encounter   Greater than 30 minutes including physician time.  Angelena Form PA 09/23/2017, 9:41 AM

## 2017-09-23 NOTE — Progress Notes (Signed)
Patient questioning RX for metoprolol 25 mg- was told to take 1/2 tab BID by Dr. Harl Bowie.  Verified mediation instructions with Kerin Ransom, PA, and instructions given to patient to take 1/2 tablet if pulse less than 55. Patient and patient's wife acknowledged understanding.  Discharge  & medication instructions reviewed with patient and patient's wife.  Patient DC home with wife.

## 2017-09-23 NOTE — Discharge Instructions (Signed)
Coronary Angiogram With Stent, Care After °This sheet gives you information about how to care for yourself after your procedure. Your health care provider may also give you more specific instructions. If you have problems or questions, contact your health care provider. °What can I expect after the procedure? °After your procedure, it is common to have: °· Bruising in the area where a small, thin tube (catheter) was inserted. This usually fades within 1-2 weeks. °· Blood collecting in the tissue (hematoma) that may be painful to the touch. It should usually decrease in size and tenderness within 1-2 weeks. ° °Follow these instructions at home: °Insertion area care °· Do not take baths, swim, or use a hot tub until your health care provider approves. °· You may shower 24-48 hours after the procedure or as directed by your health care provider. °· Follow instructions from your health care provider about how to take care of your incision. Make sure you: °? Wash your hands with soap and water before you change your bandage (dressing). If soap and water are not available, use hand sanitizer. °? Change your dressing as told by your health care provider. °? Leave stitches (sutures), skin glue, or adhesive strips in place. These skin closures may need to stay in place for 2 weeks or longer. If adhesive strip edges start to loosen and curl up, you may trim the loose edges. Do not remove adhesive strips completely unless your health care provider tells you to do that. °· Remove the bandage (dressing) and gently wash the catheter insertion site with plain soap and water. °· Pat the area dry with a clean towel. Do not rub the area, because that may cause bleeding. °· Do not apply powder or lotion to the incision area. °· Check your incision area every day for signs of infection. Check for: °? More redness, swelling, or pain. °? More fluid or blood. °? Warmth. °? Pus or a bad smell. °Activity °· Do not drive for 24 hours if you  were given a medicine to help you relax (sedative). °· Do not lift anything that is heavier than 10 lb (4.5 kg) for 5 days after your procedure or as directed by your health care provider. °· Ask your health care provider when it is okay for you: °? To return to work or school. °? To resume usual physical activities or sports. °? To resume sexual activity. °Eating and drinking °· Eat a heart-healthy diet. This should include plenty of fresh fruits and vegetables. °· Avoid the following types of food: °? Food that is high in salt. °? Canned or highly processed food. °? Food that is high in saturated fat or sugar. °? Fried food. °· Limit alcohol intake to no more than 1 drink a day for non-pregnant women and 2 drinks a day for men. One drink equals 12 oz of beer, 5 oz of wine, or 1½ oz of hard liquor. °Lifestyle °· Do not use any products that contain nicotine or tobacco, such as cigarettes and e-cigarettes. If you need help quitting, ask your health care provider. °· Take steps to manage and control your weight. °· Get regular exercise. °· Manage your blood pressure. °· Manage other health problems, such as diabetes. °General instructions °· Take over-the-counter and prescription medicines only as told by your health care provider. Blood thinners may be prescribed after your procedure to improve blood flow through the stent. °· If you need an MRI after your heart stent has been placed, be   sure to tell the health care provider who orders the MRI that you have a heart stent. °· Keep all follow-up visits as directed by your health care provider. This is important. °Contact a health care provider if: °· You have a fever. °· You have chills. °· You have increased bleeding from the catheter insertion area. Hold pressure on the area. °Get help right away if: °· You develop chest pain or shortness of breath. °· You feel faint or you pass out. °· You have unusual pain at the catheter insertion area. °· You have redness,  warmth, or swelling at the catheter insertion area. °· You have drainage (other than a small amount of blood on the dressing) from the catheter insertion area. °· The catheter insertion area is bleeding, and the bleeding does not stop after 30 minutes of holding steady pressure on the area. °· You develop bleeding from any other place, such as from your rectum. There may be bright red blood in your urine or stool, or it may appear as black, tarry stool. °This information is not intended to replace advice given to you by your health care provider. Make sure you discuss any questions you have with your health care provider. °Document Released: 04/01/2005 Document Revised: 06/09/2016 Document Reviewed: 06/09/2016 °Elsevier Interactive Patient Education © 2018 [employer] ° °

## 2017-09-28 ENCOUNTER — Telehealth: Payer: Self-pay

## 2017-09-28 NOTE — Telephone Encounter (Signed)
-----   Message from [place] sent at 09/27/2017 10:57 AM EST ----- Regarding: TOC APP in 7-14 days as a TOC f/u.   Thanks, [employer]

## 2017-09-28 NOTE — Telephone Encounter (Signed)
Patient contacted regarding discharge from [facility] on 09/23/17.  Patient understands to follow up with provider M.Lenz PA-C on 10/11/2017 at 1230 at [place]. Patient understands discharge instructions? yes Patient understands medications and regiment? yes Patient understands to bring all medications to this visit? yes

## 2017-10-11 ENCOUNTER — Telehealth: Payer: Self-pay | Admitting: *Deleted

## 2017-10-11 ENCOUNTER — Other Ambulatory Visit (HOSPITAL_COMMUNITY)
Admission: RE | Admit: 2017-10-11 | Discharge: 2017-10-11 | Disposition: A | Discharge status: Home / Self Care | Payer: PPO | Source: Ambulatory Visit | Attending: Physician Assistant | Admitting: Physician Assistant

## 2017-10-11 ENCOUNTER — Ambulatory Visit: Payer: PPO | Admitting: Physician Assistant

## 2017-10-11 ENCOUNTER — Encounter: Payer: Self-pay | Admitting: Physician Assistant

## 2017-10-11 VITALS — BP 120/68 | HR 66 | Ht 66.0 in | Wt 184.0 lb

## 2017-10-11 DIAGNOSIS — R079 Chest pain, unspecified: Secondary | ICD-10-CM

## 2017-10-11 DIAGNOSIS — Z9861 Coronary angioplasty status: Secondary | ICD-10-CM | POA: Diagnosis not present

## 2017-10-11 DIAGNOSIS — I10 Essential (primary) hypertension: Secondary | ICD-10-CM

## 2017-10-11 DIAGNOSIS — I2510 Atherosclerotic heart disease of native coronary artery without angina pectoris: Secondary | ICD-10-CM

## 2017-10-11 LAB — CBC WITH DIFFERENTIAL/PLATELET
Basophils Absolute: 0 10*3/uL (ref 0.0–0.1)
Basophils Relative: 1 %
Eosinophils Absolute: 0.1 10*3/uL (ref 0.0–0.7)
Eosinophils Relative: 2 %
HEMATOCRIT: 46.2 % (ref 39.0–52.0)
Hemoglobin: 15.3 g/dL (ref 13.0–17.0)
LYMPHS ABS: 1.9 10*3/uL (ref 0.7–4.0)
LYMPHS PCT: 25 %
MCH: 30.7 pg (ref 26.0–34.0)
MCHC: 33.1 g/dL (ref 30.0–36.0)
MCV: 92.8 fL (ref 78.0–100.0)
MONOS PCT: 10 %
Monocytes Absolute: 0.7 10*3/uL (ref 0.1–1.0)
NEUTROS ABS: 4.7 10*3/uL (ref 1.7–7.7)
Neutrophils Relative %: 62 %
Platelets: 250 10*3/uL (ref 150–400)
RBC: 4.98 MIL/uL (ref 4.22–5.81)
RDW: 13 % (ref 11.5–15.5)
WBC: 7.5 10*3/uL (ref 4.0–10.5)

## 2017-10-11 LAB — TROPONIN I: Troponin I: <0.03 ng/mL (ref <0.03)

## 2017-10-11 LAB — COMPREHENSIVE METABOLIC PANEL
ALT: 27 U/L (ref 17–63)
AST: 27 U/L (ref 15–41)
Albumin: 4.4 g/dL (ref 3.5–5.0)
Alkaline Phosphatase: 70 U/L (ref 38–126)
Anion gap: 11 (ref 5–15)
BILIRUBIN TOTAL: 0.7 mg/dL (ref 0.3–1.2)
BUN: 16 mg/dL (ref 6–20)
CO2: 26 mmol/L (ref 22–32)
Calcium: 9.5 mg/dL (ref 8.9–10.3)
Chloride: 102 mmol/L (ref 101–111)
Creatinine, Ser: 1.01 mg/dL (ref 0.61–1.24)
GFR calc Af Amer: >60 mL/min (ref >60)
Glucose, Bld: 88 mg/dL (ref 65–99)
POTASSIUM: 4.2 mmol/L (ref 3.5–5.1)
Sodium: 139 mmol/L (ref 135–145)
TOTAL PROTEIN: 7.1 g/dL (ref 6.5–8.1)

## 2017-10-11 MED ORDER — METOPROLOL TARTRATE 25 MG PO TABS: ORAL_TABLET | ORAL | 3 refills | Status: DC

## 2017-10-11 MED ORDER — ISOSORBIDE MONONITRATE ER 30 MG PO TB24: 15.0000 mg | ORAL_TABLET | Freq: Every day | ORAL | 3 refills | Status: DC

## 2017-10-11 NOTE — Patient Instructions (Signed)
Medication Instructions:  Your physician has recommended you make the following change in your medication:  Increase Metoprolol to 50 mg in the AM and 25 mg in the PM Start Imdur 15 mg Daily    Labwork: Your physician recommends that you return for lab work today    Testing/Procedures: NONE   Follow-Up: Your physician recommends that you schedule a follow-up appointment in: 1 Week with Dr. Harl Bowie    Any Other Special Instructions Will Be Listed Below (If Applicable).     If you need a refill on your cardiac medications before your next appointment, please call your pharmacy. Thank you for choosing [facility]

## 2017-10-11 NOTE — Telephone Encounter (Signed)
-----   Message from Imogene Burn, PA-C sent at 10/11/2017  3:08 PM EST ----- Troponin normal so no need to go to the hospital today.  Other labs completely normal.  Continue with the medication changes that we talked about in today's office visit.

## 2017-10-11 NOTE — Telephone Encounter (Signed)
Called patient with test results. No answer. Left message to call back.  

## 2017-10-11 NOTE — Progress Notes (Signed)
Cardiology Office Note    Date:  10/11/2017   ID:  [name], DOB 1951/05/06, MRN [id number]  PCP:  [name], MD  Cardiologist: Carlyle Dolly, MD  No chief complaint on file.   History of Present Illness:  [name] is a 67 y.o. male  hx of CAD (anterior STEMI 04/2011, s/p DES to LAD), probably bicuspid aortic valve, HTN, HLD (intolerant of statins), PVCs, prior provoked DVT treated with Xarelto for 5 months.  Secondary to hematuria.  Patient was admitted with NSTEMI 09/21/17 and underwent DES to the circumflex, patent LAD stent.  There is at night he had recurrent chest pain and was taken back to the Cath Lab which revealed patent circumflex stent and dissection into the OM 3 with TIMI-3 flow.  Plan was for medical therapy.  Dr. [name] was concerned that attempting to cross with a wire could lead to sub-intimal selection and worsening of the dissection.  Patient here for post hospital follow-up.  He says he feels terrible.  He says he feels significant chest tightness and pressure that worsens as the day goes on.  It has not gotten any better.  He says it is different than the tightness into his shoulders and back that brought him into the emergency room.  He is also having some pounding heart rates and shortness of breath.  He feels like he is developed a tremor and his right arm was sore after the cath.  He had some numbness and tingling into his right fingers but this is gradually improving.   Past Medical History:  Diagnosis Date  . Abnormality of aortic valve    a. possibly bicuspid.  . Asthma    Childhood  . Carotid artery disease ([place])    a. 1-39% bilaterally in 2017.  Marland Kitchen Coronary artery disease    a. anterior STEMI 04/2011, s/p DES to LAD.  Marland Kitchen DVT (deep venous thrombosis) ([facility])    a. tx with Xarelto x 5 months, stopped due to hematuria.  . Frequent PVCs    a. seen on Holter monitor.  . Hyperlipidemia, mixed   . Hypertension   . Myocardial infarction ([place])  05/21/2011   anterior wall, treated by Dr. [name] with 3.0x21mm Promus drug eluting stent to proximal LAD   . Peripheral arterial disease ([facility])    moderate left ICA disease, mild right ICA disease  . Prostate cancer ([place]) 2007  . Sinus bradycardia    a. HR 50s at home.  . Statin intolerance     Past Surgical History:  Procedure Laterality Date  . CARDIAC CATHETERIZATION  05/23/2011, 05/21/2011   05/21/2011   3.0 x 4mm Promus Element drug eluting stent positioned in proximal LAD, stenosis taken from 99% to 0%.  . CORONARY STENT INTERVENTION N/A 09/21/2017   Procedure: CORONARY STENT INTERVENTION;  Surgeon: [name], MD;  Location: [facility];  Service: Cardiovascular;  Laterality: N/A;  . CORONARY/GRAFT ACUTE MI REVASCULARIZATION N/A 09/21/2017   Procedure: Coronary/Graft Acute MI Revascularization;  Surgeon: [name], Peter M, MD;  Location: [facility];  Service: Cardiovascular;  Laterality: N/A;  . HERNIA REPAIR    . INTRAVASCULAR PRESSURE WIRE/FFR STUDY N/A 09/21/2017   Procedure: INTRAVASCULAR PRESSURE WIRE/FFR STUDY;  Surgeon: [name], MD;  Location: [facility];  Service: Cardiovascular;  Laterality: N/A;  . LEFT HEART CATH AND CORONARY ANGIOGRAPHY N/A 09/21/2017   Procedure: LEFT HEART CATH AND CORONARY ANGIOGRAPHY;  Surgeon: [name], Peter M,  MD;  Location: [place] [facility];  Service: Cardiovascular;  Laterality: N/A;  . LEFT HEART CATH AND CORONARY ANGIOGRAPHY N/A 09/21/2017   Procedure: LEFT HEART CATH AND CORONARY ANGIOGRAPHY;  Surgeon: [name], MD;  Location: [facility];  Service: Cardiovascular;  Laterality: N/A;  . PROSTATECTOMY  2007    Current Medications: Current Meds  Medication Sig  . acetaminophen (TYLENOL) 325 MG tablet Take 2 tablets (650 mg total) by mouth every 4 (four) hours as needed for headache or mild pain.  Marland Kitchen amLODipine (NORVASC) 2.5 MG tablet TAKE (1) TABLET BY MOUTH ONCE DAILY.  Marland Kitchen  aspirin 81 MG chewable tablet Chew 1 tablet (81 mg total) by mouth daily.  . clopidogrel (PLAVIX) 75 MG tablet Take 1 tablet (75 mg total) by mouth daily.  . Coenzyme Q10 (CO Q-10) 100 MG CAPS Take 1 capsule by mouth daily.  Marland Kitchen losartan (COZAAR) 25 MG tablet TAKE ONE TABLET BY MOUTH ONCE DAILY.  . metoprolol tartrate (LOPRESSOR) 25 MG tablet Take 1 tablet (25 mg total) by mouth 2 (two) times daily.  . nitroGLYCERIN (NITROSTAT) 0.4 MG SL tablet PLACE 1 UNDER TONGUE EVERY 5 MINUTES UP TO 3 DOSES AS NEEDED FOR CHEST PAIN.     Allergies:   Adhesive [tape]; Demerol [meperidine]; and Fentanyl   Social History   Socioeconomic History  . Marital status: Married    Spouse name: None  . Number of children: 0  . Years of education: None  . Highest education level: None  Social Needs  . Financial resource strain: None  . Food insecurity - worry: None  . Food insecurity - inability: None  . Transportation needs - medical: None  . Transportation needs - non-medical: None  Occupational History  . None  Tobacco Use  . Smoking status: Former Smoker    Packs/day: 1.00    Years: 7.00    Pack years: 7.00    Types: Cigarettes    Start date: 03/05/1970    Last attempt to quit: 09/26/1976    Years since quitting: 41.0  . Smokeless tobacco: Never Used  Substance and Sexual Activity  . Alcohol use: No    Alcohol/week: 0.0 oz  . Drug use: No  . Sexual activity: No  Other Topics Concern  . None  Social History Narrative  . None     Family History:  The patient's family history includes Heart failure in his father; Leukemia in his mother.   ROS:   Please see the history of present illness.    Review of Systems  Constitution: Negative.  HENT: Negative.   Cardiovascular: Positive for chest pain, dyspnea on exertion and palpitations.  Respiratory: Negative.   Endocrine: Negative.   Hematologic/Lymphatic: Negative.   Musculoskeletal: Positive for joint swelling.  Gastrointestinal: Negative.     Genitourinary: Negative.   Neurological: Negative.    All other systems reviewed and are negative.   PHYSICAL EXAM:   VS:  BP 120/68 (BP Location: Left Arm)   Pulse 66   Ht 5\' 6"  (1.676 m)   Wt 184 lb (83.5 kg)   SpO2 97%   BMI 29.70 kg/m   Physical Exam  GEN: Well nourished, well developed, in no acute distress  Neck: no JVD, carotid bruits, or masses Cardiac:RRR; 1/6 systolic murmur of the left sternal border Respiratory:  clear to auscultation bilaterally, normal work of breathing GI: soft, nontender, nondistended, + BS Ext: Right arm at cath site without hematoma or hemorrhage, good radial brachial  pulses, lower extremities without cyanosis, clubbing, or edema, Good distal pulses bilaterally Neuro:  Alert and Oriented x 3  Psych: euthymic mood, full affect  Wt Readings from Last 3 Encounters:  10/11/17 184 lb (83.5 kg)  09/20/17 173 lb 15.1 oz (78.9 kg)  08/30/17 183 lb (83 kg)      Studies/Labs Reviewed:   EKG:  EKG is  ordered today.  The ekg ordered today demonstrates normal sinus rhythm with PVC and T wave inversion inferior lateral which is new.  To EKG in the emergency room 12/27  Recent Labs: 09/23/2017: BUN 13; Creatinine, Ser 0.82; Hemoglobin 15.4; Platelets 202; Potassium 3.9; Sodium 139   Lipid Panel    Component Value Date/Time   CHOL 194 05/17/2016 0802   CHOL 152 03/22/2013 0813   TRIG 103 05/17/2016 0802   TRIG 83 03/22/2013 0813   HDL 40 05/17/2016 0802   HDL 39 (L) 03/22/2013 0813   CHOLHDL 4.9 05/17/2016 0802   VLDL 21 05/17/2016 0802   LDLCALC 133 (H) 05/17/2016 0802   LDLCALC 96 03/22/2013 0813    Additional studies/ records that were reviewed today include:  Cardiac cath 09/21/17 Conclusion      Prox Cx lesion is 60% stenosed.  Previously placed Ost LAD to Prox LAD stent (unknown type) is widely patent.  Prox LAD lesion is 50% stenosed.  Ost RPDA to RPDA lesion is 60% stenosed.  A stent was successfully placed.  Post  intervention, there is a 0% residual stenosis.  The left ventricular systolic function is normal.  LV end diastolic pressure is normal.  The left ventricular ejection fraction is 55-65% by visual estimate.     IMPRESSION: Successful FFR guided proximal to mid focal circumflex PCI and drug-eluting stenting using a Synergy 2.25 postdilated with a 2.5 mm noncompliant balloon stent.  The proximal LAD stent is patent with segmental disease beyond this.  There is a focal moderate proximal PDA lesion which we will treat medically at this time.  He has normal LV function.  I am going to continue full dose Angiomax for 4 hours and then discontinue this.  He can be discharged home in the morning.  He was loaded with Brilinta 180 mg which he will need to new for 12 months uninterrupted.   Quay Burow. MD, [facility] 09/21/2017 3:29 PM   Non-stenotic Ost LAD to Prox LAD lesion previously treated.  Prox LAD lesion is 50% stenosed.  Previously placed Prox Cx stent (unknown type) is widely patent.  Ost RPDA to RPDA lesion is 60% stenosed.  Ost 3rd Mrg to 3rd Mrg lesion is 50% stenosed.   1. The stent in the mid LCx is widely patent. There is an intimal dissection in the third OM. Careful review of his prior study demonstrates a wire tip induced dissection in this area. Currently there is a long segment of 50% narrowing with TIMI 3 flow. The patient is pain free. No other change in his angiography. I would favor continued medical therapy to allow this intimal dissection to heal. I am concerned that attempt at recrossing with a wire could lead to subintimal selection and worsening of the dissection. Will continue IV heparin and Ntg overnight. Continue DAPT. If he should develop evidence of reocclusion then attempt at recrossing and stenting of the dissection would be necessary.     Non-stenotic Ost LAD to Prox LAD lesion previously treated.  Prox LAD lesion is 50% stenosed.  Previously placed Prox Cx  stent (unknown type) is  widely patent.  Ost RPDA to RPDA lesion is 60% stenosed.  Ost 3rd Mrg to 3rd Mrg lesion is 50% stenosed.   1. The stent in the mid LCx is widely patent. There is an intimal dissection in the third OM. Careful review of his prior study demonstrates a wire tip induced dissection in this area. Currently there is a long segment of 50% narrowing with TIMI 3 flow. The patient is pain free. No other change in his angiography. I would favor continued medical therapy to allow this intimal dissection to heal. I am concerned that attempt at recrossing with a wire could lead to subintimal selection and worsening of the dissection. Will continue IV heparin and Ntg overnight. Continue DAPT. If he should develop evidence of reocclusion then attempt at recrossing and stenting of the dissection would be necessary.      ASSESSMENT:    No diagnosis found.   PLAN:  In order of problems listed above:  Chest pain patient has had significant chest pressure that seems to last most of the day but worse in the evening.  Also short of breath.  EKG with some inferior lateral T wave inversion which is new but could be evolving from his MI. discussed with both Dr. [name] and Dr. Bronson Ing.  Dr. [name] did not feel that the distal vessel would be amenable to PCI.  We will check a stat troponin. ( Last troponin on 12/27 was 0.31).  As well as other labs.  Increase metoprolol to 50 mg in the morning 25 in the evening.  He has had prior bradycardia so we will have to watch for this.  Add low-dose Imdur 15 mg daily.  If his troponins elevated he will have to go to the emergency room.  Follow-up with Dr. Harl Bowie next week.  CAD status post NSTEMI 09/21/17 status post stenting to the circumflex followed by STEMI and dissection that same day with cardiac cath revealing dissection of the on 3 patent but good flow circumflex and LAD stent, medical management recommended.  Hypertension blood pressure  stable  Hyperlipidemia intolerant to statins and has not been interested in other alternatives.  Will address at next office visit.  Medication Adjustments/Labs and Tests Ordered: Current medicines are reviewed at length with the patient today.  Concerns regarding medicines are outlined above.  Medication changes, Labs and Tests ordered today are listed in the Patient Instructions below. There are no Patient Instructions on file for this visit.   Sumner Boast, PA-C  10/11/2017 12:27 PM    [facility] [address], [place], [place]  [address] Phone: [phone]; Fax: [phone]

## 2017-10-14 ENCOUNTER — Other Ambulatory Visit: Payer: Self-pay | Admitting: Cardiovascular Disease

## 2017-10-17 ENCOUNTER — Ambulatory Visit: Payer: PPO | Admitting: Cardiology

## 2017-10-17 ENCOUNTER — Encounter: Payer: Self-pay | Admitting: Cardiology

## 2017-10-17 VITALS — BP 120/80 | HR 62 | Ht 66.0 in | Wt 184.0 lb

## 2017-10-17 DIAGNOSIS — I2510 Atherosclerotic heart disease of native coronary artery without angina pectoris: Secondary | ICD-10-CM | POA: Diagnosis not present

## 2017-10-17 DIAGNOSIS — R0789 Other chest pain: Secondary | ICD-10-CM | POA: Diagnosis not present

## 2017-10-17 NOTE — Patient Instructions (Signed)
Medication Instructions:  Your physician recommends that you continue on your current medications as directed. Please refer to the Current Medication list given to you today.   Labwork: NONE  Testing/Procedures: NONE  Follow-Up: Your physician recommends that you schedule a follow-up appointment in: 2 MONTHS    Any Other Special Instructions Will Be Listed Below (If Applicable).     If you need a refill on your cardiac medications before your next appointment, please call your pharmacy.   

## 2017-10-17 NOTE — Progress Notes (Signed)
Clinical Summary Mr. Coller is a 67 y.o.male seen today for follow up of the following medical problems. This is a focused visit on recent symptoms of chest pain.   1. CAD - hx of prior anterior STEMI 04/2011, s/p DES to LAD. Repeat cath 04/2011 with patent stent and overall patent vessels.  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 Documented only 77% of THR, however discussed with technicians and they state he reached target heart rate.    - 04/2017 exercise nuclear stress: low risk Duke score of 10. Small basal inferolateral infarct with mild ischemia. Overall low risk -cath 08/2017 with LCX 60%, patent LAD stent, prox LAD 50%, RPDA 60%. LCX with decreased FFR, received DES, PDA lesion managed medically. Later that admit had recurrent chest pain and had repeat cath, showed an intimal dissection in the OM3 leading to 50% obstruction with TIMI 3 flow.   - recent chest pain symptoms. From last clinic note PA Lenze discussed with Dr [name], the distal vessel not thought to be amenable to PCI. Lopressor increased to 50mg  in AM and 25mg  in PM, started imdur 15mg  daily. Troponin neg that visit.  - headache with imdur, off now.  - entire chest full of pressure, fullness. Tends to start at lunch, constant pain over several hours that worsens throughout the day. +palpitations, chronic SOB. Not positional. Better with sleeping. Dry cough, later in the days is productive. Cna have some occasional wheezing in the morning.       Past Medical History:  Diagnosis Date  . Abnormality of aortic valve    a. possibly bicuspid.  . Asthma    Childhood  . Carotid artery disease ([facility])    a. 1-39% bilaterally in 2017.  Marland Kitchen Coronary artery disease    a. anterior STEMI 04/2011, s/p DES to LAD.  Marland Kitchen DVT (deep venous thrombosis) ([facility])    a. tx with Xarelto x 5 months, stopped due to hematuria.  . Frequent PVCs      a. seen on Holter monitor.  . Hyperlipidemia, mixed   . Hypertension   . Myocardial infarction ([place]) 05/21/2011   anterior wall, treated by Dr. [name] with 3.0x67mm Promus drug eluting stent to proximal LAD   . Peripheral arterial disease ([facility])    moderate left ICA disease, mild right ICA disease  . Prostate cancer ([place]) 2007  . Sinus bradycardia    a. HR 50s at home.  . Statin intolerance      Allergies  Allergen Reactions  . Adhesive [Tape] Hives    Hives  And itching from the adhesive on the pads of the heart monitor   . Demerol [Meperidine] Nausea Only    Drop in blood pressure  Muscle spasm  . Fentanyl Nausea Only    Drop in blood pressure Muscle spasm     Current Outpatient Medications  Medication Sig Dispense Refill  . acetaminophen (TYLENOL) 325 MG tablet Take 2 tablets (650 mg total) by mouth every 4 (four) hours as needed for headache or mild pain.    Marland Kitchen amLODipine (NORVASC) 2.5 MG tablet TAKE (1) TABLET BY MOUTH ONCE DAILY. 90 tablet 1  . aspirin 81 MG chewable tablet Chew 1 tablet (81 mg total) by mouth daily.    . clopidogrel (PLAVIX) 75 MG tablet Take 1 tablet (75 mg total) by mouth daily. 90 tablet 3  . Coenzyme  Q10 (CO Q-10) 100 MG CAPS Take 1 capsule by mouth daily.    . isosorbide mononitrate (IMDUR) 30 MG 24 hr tablet Take 0.5 tablets (15 mg total) by mouth daily. 45 tablet 3  . losartan (COZAAR) 25 MG tablet TAKE ONE TABLET BY MOUTH ONCE DAILY. 90 tablet 1  . metoprolol tartrate (LOPRESSOR) 25 MG tablet Take 50 mg ( 2 Tablets) in the AM and 25 mg ( 1 Tablet) in the PM 270 tablet 3  . nitroGLYCERIN (NITROSTAT) 0.4 MG SL tablet PLACE 1 UNDER TONGUE EVERY 5 MINUTES UP TO 3 DOSES AS NEEDED FOR CHEST PAIN. 25 tablet 3   No current facility-administered medications for this visit.      Past Surgical History:  Procedure Laterality Date  . CARDIAC CATHETERIZATION  05/23/2011, 05/21/2011   05/21/2011   3.0 x 58mm Promus Element drug eluting stent  positioned in proximal LAD, stenosis taken from 99% to 0%.  . CORONARY STENT INTERVENTION N/A 09/21/2017   Procedure: CORONARY STENT INTERVENTION;  Surgeon: [name], MD;  Location: [facility];  Service: Cardiovascular;  Laterality: N/A;  . CORONARY/GRAFT ACUTE MI REVASCULARIZATION N/A 09/21/2017   Procedure: Coronary/Graft Acute MI Revascularization;  Surgeon: [name], Peter M, MD;  Location: [facility];  Service: Cardiovascular;  Laterality: N/A;  . HERNIA REPAIR    . INTRAVASCULAR PRESSURE WIRE/FFR STUDY N/A 09/21/2017   Procedure: INTRAVASCULAR PRESSURE WIRE/FFR STUDY;  Surgeon: [name], MD;  Location: [facility];  Service: Cardiovascular;  Laterality: N/A;  . LEFT HEART CATH AND CORONARY ANGIOGRAPHY N/A 09/21/2017   Procedure: LEFT HEART CATH AND CORONARY ANGIOGRAPHY;  Surgeon: [name], Peter M, MD;  Location: [facility];  Service: Cardiovascular;  Laterality: N/A;  . LEFT HEART CATH AND CORONARY ANGIOGRAPHY N/A 09/21/2017   Procedure: LEFT HEART CATH AND CORONARY ANGIOGRAPHY;  Surgeon: [name], MD;  Location: [facility];  Service: Cardiovascular;  Laterality: N/A;  . PROSTATECTOMY  2007     Allergies  Allergen Reactions  . Adhesive [Tape] Hives    Hives  And itching from the adhesive on the pads of the heart monitor   . Demerol [Meperidine] Nausea Only    Drop in blood pressure  Muscle spasm  . Fentanyl Nausea Only    Drop in blood pressure Muscle spasm      Family History  Problem Relation Age of Onset  . Leukemia Mother   . Heart failure Father        CABG at age 72     Social History Mr. Modeste reports that he quit smoking about 41 years ago. His smoking use included cigarettes. He started smoking about 47 years ago. He has a 7.00 pack-year smoking history. he has never used smokeless tobacco. Mr. Quattrone reports that he does not drink alcohol.   Review of Systems CONSTITUTIONAL: No weight loss,  fever, chills, weakness or fatigue.  HEENT: Eyes: No visual loss, blurred vision, double vision or yellow sclerae.No hearing loss, sneezing, congestion, runny nose or sore throat.  SKIN: No rash or itching.  CARDIOVASCULAR: per hpi RESPIRATORY: per hpi GASTROINTESTINAL: No anorexia, nausea, vomiting or diarrhea. No abdominal pain or blood.  GENITOURINARY: No burning on urination, no polyuria NEUROLOGICAL: No headache, dizziness, syncope, paralysis, ataxia, numbness or tingling in the extremities. No change in bowel or bladder control.  MUSCULOSKELETAL: No muscle, back pain, joint pain or stiffness.  LYMPHATICS: No enlarged nodes. No history of splenectomy.  PSYCHIATRIC:  No history of depression or anxiety.  ENDOCRINOLOGIC: No reports of sweating, cold or heat intolerance. No polyuria or polydipsia.  Marland Kitchen   Physical Examination Vitals:   10/17/17 1320  BP: 120/80  Pulse: 62  SpO2: 97%   Vitals:   10/17/17 1320  Weight: 184 lb (83.5 kg)  Height: 5\' 6"  (1.676 m)    Gen: resting comfortably, no acute distress HEENT: no scleral icterus, pupils equal round and reactive, no palptable cervical adenopathy,  CV: RRR, no m/r/g, no jvd Resp: Clear to auscultation bilaterally GI: abdomen is soft, non-tender, non-distended, normal bowel sounds, no hepatosplenomegaly MSK: extremities are warm, no edema.  Skin: warm, no rash Neuro:  no focal deficits Psych: appropriate affect   Diagnostic Studies 04/2011 Cath HEMODYNAMIC FINDINGS: Central aortic pressure 109/68. Left ventricular  pressure 105/0/15.  ANGIOGRAPHIC FINDINGS:  1. The left main artery had no disease.  2. The left anterior descending was a large vessel that coursed to the  apex and gave off 5 small-caliber diagonal branches. Proximal  vessel has a 99% subtotal occlusion. The midvessel had two areas  of tubular 40% stenosis.  3. The circumflex artery had mild plaque disease.  4. The right coronary artery was a large  dominant vessel with mild  plaque disease in the proximal mid vessel.  5. Left ventricular angiogram was performed in the RAO projection and  showed severe left ventricular systolic dysfunction with ejection  fraction of 30%. The anterior wall had severe hypokinesis.  IMPRESSION:  1. Anterior ST-elevation myocardial infarction.  2. Subtotal occlusion of the proximal left anterior descending artery.  3. Moderate left ventricular systolic dysfunction.  4. Successful percutaneous transluminal coronary angioplasty with  placement of a drug-eluting stent in the proximal left anterior  descending x1.  10/2014 Exercise MPI IMPRESSION: 1. No reversible ischemia or infarction at the level of stress achieved, patient did not reach 85% of target heart rate.  2. Normal left ventricular wall motion.  3. Left ventricular ejection fraction 52%  4. Low-risk stress test findings*. Please note test sensitivity is decreased, patient did not reach target heart rate.   10/2014 Carotid US  IMPRESSION: 1. Interval reduction in left-sided atherosclerotic plaque burden, though residual plaque results in persistently borderline elevated peak systolic velocities within the left internal carotid artery compatible with the lower end of the 50-69% luminal narrowing range. Further evaluation could be performed with CTA as clinically indicated. 2. Interval reduction in right-sided atherosclerotic plaque burden, with residual plaque not resulting in a hemodynamically significant stenosis.    04/2017 nuclear stress  No diagnostic ST segment changes by standard criteria. Occasional to frequent PVCs noted in recovery, no sustained arrhythmias. Low risk Duke treadmill score of 10.  Blood pressure demonstrated a hypertensive response to exercise.  Small, moderate intensity, partially reversible mid to basal inferolateral defect suggestive of a region of mixed scar and ischemia. Small,  mild intensity, apical anteroseptal defect is fixed  This is a low risk study.  Nuclear stress EF: 52%.   08/2017 cath  Prox Cx lesion is 60% stenosed.  Previously placed Ost LAD to Prox LAD stent (unknown type) is widely patent.  Prox LAD lesion is 50% stenosed.  Ost RPDA to RPDA lesion is 60% stenosed.  A stent was successfully placed.  Post intervention, there is a 0% residual stenosis.  The left ventricular systolic function is normal.  LV end diastolic pressure is normal.  The left ventricular ejection fraction is 55-65% by visual estimate.   Successful  FFR guided proximal to mid focal circumflex PCI and drug-eluting stenting using a Synergy 2.25 postdilated with a 2.5 mm noncompliant balloon stent.  The proximal LAD stent is patent with segmental disease beyond this.  There is a focal moderate proximal PDA lesion which we will treat medically at this time.  He has normal LV function.  I am going to continue full dose Angiomax for 4 hours and then discontinue this.  He can be discharged home in the morning.  He was loaded with Brilinta 180 mg which he will need to new for 12 months uninterrupted. Assessment and Plan   1. CAD/chest pain - his current symptoms are very atypical for cardiac etiology. They come on at [place] and last all day long without relief. Can have coughing, wheezing at times.Symptom not consistent with angina despite his CAD history. Last cath including a relook cath last admit for ongoing chest pain with patent vessels despite his coronary dissection.   - we discussed possible other etiologies of his symptoms including GERD or reactive airway disease, which he has a prior history of when he was younger. I had recommended a trial of PPI and PFTs. He is resistant to looking or emperically treating some of these other causes and wishes to discuss with pcp - at this time based on his recent cath, atypical symptoms, and description of the prior dissected aftery as  poorly amenable to revasc I would not recommend repeat cath at this time. Further adjustements of antianginals have not helped         Arnoldo Lenis, M.D.

## 2017-10-23 ENCOUNTER — Encounter: Payer: Self-pay | Admitting: Cardiology

## 2017-12-25 ENCOUNTER — Ambulatory Visit: Payer: PPO | Admitting: Cardiology

## 2017-12-27 ENCOUNTER — Other Ambulatory Visit (HOSPITAL_COMMUNITY): Payer: Self-pay | Admitting: Family Medicine

## 2017-12-27 ENCOUNTER — Telehealth: Payer: Self-pay | Admitting: Cardiology

## 2017-12-27 DIAGNOSIS — R0602 Shortness of breath: Secondary | ICD-10-CM

## 2017-12-27 NOTE — Telephone Encounter (Signed)
Ok with me  Keyli Duross MD, FACC  

## 2017-12-27 NOTE — Telephone Encounter (Signed)
Patient would like to switch from Dr. Harl Bowie to Dr. [name]. Patient states that Dr. [name] performed cath and patient would like to follow-up with Dr. [name]. Would this switch be okay?

## 2017-12-27 NOTE — Telephone Encounter (Signed)
Just received a call from Dr. Karie Kirks wanting Dr. [name] to see pt sooner than scheduled 5/9 appointment. I told him I would check Dr. Doug Sou schedule and see his availably but it depends on cancellation as he is fully booked when in the office.  Reviewed Dr. [name] schedule he had cancellation on Monday 4/8 @ 3:40pm (not a new pt slot) but I scheduled pt in at this time. Educated that if pt chest pain gets worse before this appointment he needs to call 911 or go to the ED to be evaluated.  Dr. Karie Kirks was appreciative and stated he would call the pt to make him aware of the appointment.

## 2017-12-27 NOTE — Telephone Encounter (Signed)
Ok with me  J Astaria Nanez MD 

## 2017-12-27 NOTE — Telephone Encounter (Signed)
Spoke with scheduling department regarding patient needing appointment. Explained that is looked as though this patient was an established patient of Dr Harl Bowie and lived in [place]. Advised to have them reach out to Dr Arrie [facility] office. Did discuss with them prior to the phone message that patient wanted to change providers.

## 2017-12-27 NOTE — Telephone Encounter (Signed)
New Message    Called for Dr [name] on DOD ( no answer at DOD line)  Wants patient seen this week for Angina - does not want patient seeing PA on Dr [name] , patient has appt on 02/01/18 , he wants him seen asap within week

## 2017-12-28 ENCOUNTER — Ambulatory Visit (HOSPITAL_COMMUNITY)
Admission: RE | Admit: 2017-12-28 | Discharge: 2017-12-28 | Disposition: A | Discharge status: Home / Self Care | Payer: PPO | Source: Ambulatory Visit | Attending: Family Medicine | Admitting: Family Medicine

## 2017-12-28 ENCOUNTER — Other Ambulatory Visit (HOSPITAL_COMMUNITY)
Admission: RE | Admit: 2017-12-28 | Discharge: 2017-12-28 | Disposition: A | Discharge status: Home / Self Care | Payer: PPO | Source: Ambulatory Visit | Attending: Family Medicine | Admitting: Family Medicine

## 2017-12-28 DIAGNOSIS — Z125 Encounter for screening for malignant neoplasm of prostate: Secondary | ICD-10-CM | POA: Diagnosis not present

## 2017-12-28 DIAGNOSIS — R0789 Other chest pain: Secondary | ICD-10-CM | POA: Diagnosis not present

## 2017-12-28 DIAGNOSIS — Z7901 Long term (current) use of anticoagulants: Secondary | ICD-10-CM | POA: Diagnosis not present

## 2017-12-28 DIAGNOSIS — R0602 Shortness of breath: Secondary | ICD-10-CM | POA: Diagnosis not present

## 2017-12-28 DIAGNOSIS — E785 Hyperlipidemia, unspecified: Secondary | ICD-10-CM | POA: Diagnosis not present

## 2017-12-28 DIAGNOSIS — Z86718 Personal history of other venous thrombosis and embolism: Secondary | ICD-10-CM | POA: Diagnosis not present

## 2017-12-28 DIAGNOSIS — I25119 Atherosclerotic heart disease of native coronary artery with unspecified angina pectoris: Secondary | ICD-10-CM | POA: Insufficient documentation

## 2017-12-28 LAB — LIPID PANEL
CHOL/HDL RATIO: 5.5 ratio
Cholesterol: 209 mg/dL — ABNORMAL HIGH (ref 0–200)
HDL: 38 mg/dL — ABNORMAL LOW (ref >40)
LDL CALC: 128 mg/dL — AB (ref 0–99)
Triglycerides: 215 mg/dL — ABNORMAL HIGH (ref <150)
VLDL: 43 mg/dL — AB (ref 0–40)

## 2017-12-28 LAB — COMPREHENSIVE METABOLIC PANEL
ALBUMIN: 4.2 g/dL (ref 3.5–5.0)
ALT: 23 U/L (ref 17–63)
AST: 23 U/L (ref 15–41)
Alkaline Phosphatase: 66 U/L (ref 38–126)
Anion gap: 9 (ref 5–15)
BUN: 18 mg/dL (ref 6–20)
CHLORIDE: 106 mmol/L (ref 101–111)
CO2: 26 mmol/L (ref 22–32)
CREATININE: 1.01 mg/dL (ref 0.61–1.24)
Calcium: 9.3 mg/dL (ref 8.9–10.3)
GFR calc Af Amer: >60 mL/min (ref >60)
GFR calc non Af Amer: >60 mL/min (ref >60)
GLUCOSE: 105 mg/dL — AB (ref 65–99)
POTASSIUM: 4.7 mmol/L (ref 3.5–5.1)
Sodium: 141 mmol/L (ref 135–145)
Total Bilirubin: 1 mg/dL (ref 0.3–1.2)
Total Protein: 7 g/dL (ref 6.5–8.1)

## 2017-12-28 LAB — CBC
HEMATOCRIT: 48.8 % (ref 39.0–52.0)
Hemoglobin: 16 g/dL (ref 13.0–17.0)
MCH: 30.7 pg (ref 26.0–34.0)
MCHC: 32.8 g/dL (ref 30.0–36.0)
MCV: 93.7 fL (ref 78.0–100.0)
PLATELETS: 204 10*3/uL (ref 150–400)
RBC: 5.21 MIL/uL (ref 4.22–5.81)
RDW: 12.9 % (ref 11.5–15.5)
WBC: 6 10*3/uL (ref 4.0–10.5)

## 2017-12-28 LAB — PSA: PROSTATIC SPECIFIC ANTIGEN: 0.01 ng/mL (ref 0.00–4.00)

## 2017-12-28 LAB — TSH: TSH: 3.025 u[IU]/mL (ref 0.350–4.500)

## 2017-12-31 NOTE — H&P (View-Only) (Signed)
Cardiology Office Note   Date:  01/01/2018   ID:  Calhoun, Reichardt 1950/11/14, MRN [id number]  PCP:  [name], MD  Cardiologist:  Chasya Zenz [name], MD   Chief Complaint  Patient presents with  . Follow-up  . Headache  . Shortness of Breath  . Chest Pain    About ten minutes ago.      History of Present Illness: CALLIE BUNYARD is a 67 y.o. male who presents for follow up CAD. 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 Later had severe pain in his chest, back, and jaw. He was transferred to [facility] for further evaluation. Cath done 12/27 by Dr [name] revealed a patent LAD stent with a mLAD 50%, 60% mCFX, and 60% PDA- diffuse. The PDA disease was unchanged from 2012.  The CFX lesion was significant by FFR and he underwent PCI with DES. Later that night he had recurrent chest pain and was taken back to the lab. Cath revealed patent CFX stent with dissection in the OM3 with TIM 3 flow felt to be related to wire dissection.  Plan was for medical Rx. I felt  that attempt at recrossing with a wire could lead to subintimal selection and worsening of the dissection. He was managed medically.  Since that hospitalization he has continued to have problems. He notes a lot of pressure in his chest. He complains of dizziness, SOB and pounding in his chest. He feels exhausted by the end of the day. He complains of muscle aches and legs hurt all the time. He generally does OK in the morning and gets his exercise then. This includes 30 minutes on  elliptical then some weight lifting and then a 2 mile walk. He previously was able to walk 5 miles. As the day goes on his symptoms progressively worsen. He has a dry cough. No change in medication except for Plavix added in Dec. He does complain of some persistent pain in right arm from radial cath in December.   Past Medical History:  Diagnosis Date  . Abnormality of aortic valve    a. possibly bicuspid.  . Asthma    Childhood  . Carotid artery disease ([place])    a. 1-39% bilaterally in 2017.  Marland Kitchen Coronary artery disease    a. anterior STEMI 04/2011, s/p DES to LAD.  Marland Kitchen DVT (deep venous thrombosis) (HCC)    a. tx with Xarelto x 5 months, stopped due to hematuria.  . Frequent PVCs    a. seen on Holter monitor.  . Hyperlipidemia, mixed   . Hypertension   . Myocardial infarction ([place]) 05/21/2011   anterior wall, treated by Dr. [name] with 3.0x18mm Promus drug eluting stent to proximal LAD   . Peripheral arterial disease (HCC)    moderate left ICA disease, mild right ICA disease  . Prostate cancer ([place]) 2007  . Sinus bradycardia    a. HR 50s at home.  . Statin intolerance     Past Surgical History:  Procedure Laterality Date  . CARDIAC CATHETERIZATION  05/23/2011,  05/21/2011   05/21/2011   3.0 x 54mm Promus Element drug eluting stent positioned in proximal LAD, stenosis taken from 99% to 0%.  . CORONARY STENT INTERVENTION N/A 09/21/2017   Procedure: CORONARY STENT INTERVENTION;  Surgeon: [name], MD;  Location: [place] [facility];  Service: Cardiovascular;  Laterality: N/A;  . CORONARY/GRAFT ACUTE MI REVASCULARIZATION N/A 09/21/2017   Procedure: Coronary/Graft Acute MI Revascularization;  Surgeon: [name], Kurtiss Wence M, MD;  Location: [place] [facility];  Service: Cardiovascular;  Laterality: N/A;  . HERNIA REPAIR    . INTRAVASCULAR PRESSURE WIRE/FFR STUDY N/A 09/21/2017   Procedure: INTRAVASCULAR PRESSURE WIRE/FFR STUDY;  Surgeon: [name], MD;  Location: [place] [facility];  Service: Cardiovascular;  Laterality: N/A;  . LEFT HEART CATH AND CORONARY ANGIOGRAPHY N/A 09/21/2017   Procedure: LEFT HEART CATH AND CORONARY ANGIOGRAPHY;  Surgeon: [name], Eric Morganti M, MD;  Location: [place] [facility];  Service: Cardiovascular;  Laterality: N/A;  . LEFT HEART CATH AND CORONARY ANGIOGRAPHY N/A 09/21/2017   Procedure: LEFT HEART CATH AND CORONARY ANGIOGRAPHY;  Surgeon: [name], MD;  Location: [place] [facility];  Service: Cardiovascular;  Laterality: N/A;  . PROSTATECTOMY  2007     Current Outpatient Medications  Medication Sig Dispense Refill  . acetaminophen (TYLENOL) 325 MG tablet Take 2 tablets (650 mg total) by mouth every 4 (four) hours as needed for headache or mild pain.    Marland Kitchen amLODipine (NORVASC) 2.5 MG tablet TAKE (1) TABLET BY MOUTH ONCE DAILY. 90 tablet 1  . aspirin 81 MG chewable tablet Chew 1 tablet (81 mg total) by mouth daily.    . clopidogrel (PLAVIX) 75 MG tablet Take 1 tablet (75 mg total) by mouth daily. 90 tablet 3  . Coenzyme Q10 (CO Q-10) 100 MG CAPS Take 1 capsule by mouth daily.    Marland Kitchen losartan (COZAAR) 25 MG tablet TAKE ONE TABLET BY MOUTH ONCE DAILY. 90 tablet 1  . metoprolol tartrate (LOPRESSOR) 25 MG tablet Take 50 mg ( 2 Tablets) in the AM and 25 mg ( 1 Tablet) in the PM 270 tablet 3  . nitroGLYCERIN (NITROSTAT) 0.4 MG SL tablet PLACE 1 UNDER TONGUE EVERY 5 MINUTES UP TO 3 DOSES AS NEEDED FOR CHEST PAIN. 25 tablet 3   No current facility-administered medications for this visit.     Allergies:   Adhesive [tape]; Demerol [meperidine]; and Fentanyl    Social History:  The patient  reports that he quit smoking about 41 years ago. His smoking use included cigarettes. He started smoking about 47 years ago. He has a 7.00 pack-year smoking history. He has never used smokeless tobacco. He reports that he does not drink alcohol or use drugs.   Family History:  The patient's family history includes Heart failure in his father;  Leukemia in his mother.    ROS:  Please see the history of present illness.   Otherwise, review of systems are positive for none.   All other systems are reviewed and negative.    PHYSICAL EXAM: VS:  BP 124/76 (BP Location: Left Arm, Patient Position: Sitting, Cuff Size: Normal)   Pulse 65   Ht 5\' 6"  (1.676 m)   Wt 187 lb (84.8 kg)   BMI 30.18 kg/m  , BMI Body mass index is 30.18 kg/m. GEN: Well nourished, well developed, in no acute distress  HEENT: normal  Neck: no JVD, left carotid bruit, or masses Cardiac: RRR; no murmurs, rubs, or gallops,no edema  Respiratory:  clear to auscultation bilaterally, normal work of breathing GI: soft, nontender, nondistended, + BS MS: no deformity or atrophy  Skin: warm and dry, no rash Neuro:  Strength and sensation are intact Psych: euthymic mood, full affect   EKG:  EKG is ordered today. The ekg ordered today demonstrates NSR, LAD. Otherwise normal. I have personally reviewed and interpreted this study.    Recent Labs: 12/28/2017: ALT 23; BUN 18; Creatinine, Ser 1.01; Hemoglobin 16.0; Platelets 204; Potassium 4.7; Sodium 141; TSH 3.025    Lipid Panel    Component Value Date/Time   CHOL 209 (H) 12/28/2017 1027   CHOL 152 03/22/2013 0813   TRIG 215 (H) 12/28/2017 1027   TRIG 83 03/22/2013 0813   HDL 38 (L) 12/28/2017 1027   HDL 39 (L) 03/22/2013 0813   CHOLHDL 5.5 12/28/2017 1027   VLDL 43 (H) 12/28/2017 1027   LDLCALC 128 (H) 12/28/2017 1027   LDLCALC 96 03/22/2013 0813      Wt Readings from Last 3 Encounters:  01/01/18 187 lb (84.8 kg)  10/17/17 184 lb (83.5 kg)  10/11/17 184 lb (83.5 kg)      Other studies Reviewed: Additional studies/ records that were reviewed today include:  04/2017 exercise nuclear stress: low risk Duke score of 10. Small basal inferolateral infarct with mild ischemia. Overall low risk    Cardiac cath 09/21/17:Procedures   CORONARY STENT INTERVENTION  INTRAVASCULAR PRESSURE WIRE/FFR STUDY    LEFT HEART CATH AND CORONARY ANGIOGRAPHY  Conclusion     Prox Cx lesion is 60% stenosed.  Previously placed Ost LAD to Prox LAD stent (unknown type) is widely patent.  Prox LAD lesion is 50% stenosed.  Ost RPDA to RPDA lesion is 60% stenosed.  A stent was successfully placed.  Post intervention, there is a 0% residual stenosis.  The left ventricular systolic function is normal.  LV end diastolic pressure is normal.  The left ventricular ejection fraction is 55-65% by visual estimate.     Repeat cardiac cath 09/21/17: Procedures   Coronary/Graft Acute MI Revascularization  LEFT HEART CATH AND CORONARY ANGIOGRAPHY  Conclusion     Non-stenotic Ost LAD to Prox LAD lesion previously treated.  Prox LAD lesion is 50% stenosed.  Previously placed Prox Cx stent (unknown type) is widely patent.  Ost RPDA to RPDA lesion is 60% stenosed.  Ost 3rd Mrg to 3rd Mrg lesion is 50% stenosed.   1. The stent in the mid LCx is widely patent. There is an intimal dissection in the third OM. Careful review of his prior study demonstrates a wire tip induced dissection in this area. Currently there is a long segment of 50% narrowing with TIMI 3 flow. The patient is pain free. No other change in his angiography. I would favor continued medical therapy to allow this intimal dissection to heal. I am concerned that attempt at recrossing with a wire could lead to subintimal selection and worsening of the dissection. Will continue IV heparin and Ntg overnight. Continue DAPT. If he should develop evidence of reocclusion then attempt at recrossing and stenting of the dissection would be necessary.       ASSESSMENT AND PLAN:  1.  CAD s/p remote Anterior STEMI in 2012 treated with DES of LAD. More recent NSTEMI in December 2018. He had DES of LCx with abnormal FFR. Subsequent wire dissection of OM 3 treated medically. He now has persistent chest pain, dyspnea, fatigue. Concern for angina secondary  to OM3 stenosis or other CAD. Prior stress  tests have not been that revealing. It is possible his symptoms could be noncoronary related. We discussed work up. At this point I would start with cardiac cath given recent events in December. Will use femoral approach. If Cardiac cath looks ok will consider other work up including CT angio chest or Echo. Recent labs all look good. The procedure and risks were reviewed including but not limited to death, myocardial infarction, stroke, arrythmias, bleeding, transfusion, emergency surgery, dye allergy, or renal dysfunction. The patient voices understanding and is agreeable to proceed.   2. Hypercholesterolemia. Lipids are not ideal. Intolerance to statins. Will need to consider alternative therapy. He has been resistant to try in the past.  3. HTN.   4. Carotid arterial disease  5. Sinus bradycardia.  6. ? Bicuspid AV.    Current medicines are reviewed at length with the patient today.  The patient does not have concerns regarding medicines.  The following changes have been made:  no change  Labs/ tests ordered today include: PT No orders of the defined types were placed in this encounter.    Disposition:   Proceed with cardiac cath on April 15.   Signed, Marianela Mandrell [name], MD  01/01/2018 3:59 PM    [place] [facility] [address], [place], [place], [address] Phone [phone], Fax [phone]

## 2017-12-31 NOTE — Progress Notes (Signed)
Cardiology Office Note   Date:  01/01/2018   ID:  Sheppard, Kyle 08/14/1951, MRN [id number]  PCP:  [name], MD  Cardiologist:  [name], MD   Chief Complaint  Patient presents with  . Follow-up  . Headache  . Shortness of Breath  . Chest Pain    About ten minutes ago.      History of Present Illness: [name] is a 67 y.o. male who presents for follow up CAD. 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 Later had severe pain in his chest, back, and jaw. He was transferred to [facility] for further evaluation. Cath done 12/27 by Dr [name] revealed a patent LAD stent with a mLAD 50%, 60% mCFX, and 60% PDA- diffuse. The PDA disease was unchanged from 2012.  The CFX lesion was significant by FFR and he underwent PCI with DES. Later that night he had recurrent chest pain and was taken back to the lab. Cath revealed patent CFX stent with dissection in the OM3 with TIM 3 flow felt to be related to wire dissection.  Plan was for medical Rx. I felt  that attempt at recrossing with a wire could lead to subintimal selection and worsening of the dissection. He was managed medically.  Since that hospitalization he has continued to have problems. He notes a lot of pressure in his chest. He complains of dizziness, SOB and pounding in his chest. He feels exhausted by the end of the day. He complains of muscle aches and legs hurt all the time. He generally does OK in the morning and gets his exercise then. This includes 30 minutes on  elliptical then some weight lifting and then a 2 mile walk. He previously was able to walk 5 miles. As the day goes on his symptoms progressively worsen. He has a dry cough. No change in medication except for Plavix added in Dec. He does complain of some persistent pain in right arm from radial cath in December.   Past Medical History:  Diagnosis Date  . Abnormality of aortic valve    a. possibly bicuspid.  . Asthma    Childhood  . Carotid artery disease ([place])    a. 1-39% bilaterally in 2017.  Marland Kitchen Coronary artery disease    a. anterior STEMI 04/2011, s/p DES to LAD.  Marland Kitchen DVT (deep venous thrombosis) (H[facility])    a. tx with Xarelto x 5 months, stopped due to hematuria.  . Frequent PVCs    a. seen on Holter monitor.  . Hyperlipidemia, mixed   . Hypertension   . Myocardial infarction ([place]) 05/21/2011   anterior wall, treated by Dr. [name] with 3.0x65mm Promus drug eluting stent to proximal LAD   . Peripheral arterial disease ([facility])    moderate left ICA disease, mild right ICA disease  . Prostate cancer ([place]) 2007  . Sinus bradycardia    a. HR 50s at home.  . Statin intolerance     Past Surgical History:  Procedure Laterality Date  . CARDIAC CATHETERIZATION  05/23/2011,  05/21/2011   05/21/2011   3.0 x 10mm Promus Element drug eluting stent positioned in proximal LAD, stenosis taken from 99% to 0%.  . CORONARY STENT INTERVENTION N/A 09/21/2017   Procedure: CORONARY STENT INTERVENTION;  Surgeon: [name], MD;  Location: [place] [facility];  Service: Cardiovascular;  Laterality: N/A;  . CORONARY/GRAFT ACUTE MI REVASCULARIZATION N/A 09/21/2017   Procedure: Coronary/Graft Acute MI Revascularization;  Surgeon: [name], [name], MD;  Location: [place] [facility];  Service: Cardiovascular;  Laterality: N/A;  . HERNIA REPAIR    . INTRAVASCULAR PRESSURE WIRE/FFR STUDY N/A 09/21/2017   Procedure: INTRAVASCULAR PRESSURE WIRE/FFR STUDY;  Surgeon: [name], MD;  Location: [place] [facility];  Service: Cardiovascular;  Laterality: N/A;  . LEFT HEART CATH AND CORONARY ANGIOGRAPHY N/A 09/21/2017   Procedure: LEFT HEART CATH AND CORONARY ANGIOGRAPHY;  Surgeon: [name], [name], MD;  Location: [place] [facility];  Service: Cardiovascular;  Laterality: N/A;  . LEFT HEART CATH AND CORONARY ANGIOGRAPHY N/A 09/21/2017   Procedure: LEFT HEART CATH AND CORONARY ANGIOGRAPHY;  Surgeon: [name], MD;  Location: [place] [facility];  Service: Cardiovascular;  Laterality: N/A;  . PROSTATECTOMY  2007     Current Outpatient Medications  Medication Sig Dispense Refill  . acetaminophen (TYLENOL) 325 MG tablet Take 2 tablets (650 mg total) by mouth every 4 (four) hours as needed for headache or mild pain.    Marland Kitchen amLODipine (NORVASC) 2.5 MG tablet TAKE (1) TABLET BY MOUTH ONCE DAILY. 90 tablet 1  . aspirin 81 MG chewable tablet Chew 1 tablet (81 mg total) by mouth daily.    . clopidogrel (PLAVIX) 75 MG tablet Take 1 tablet (75 mg total) by mouth daily. 90 tablet 3  . Coenzyme Q10 (CO Q-10) 100 MG CAPS Take 1 capsule by mouth daily.    Marland Kitchen losartan (COZAAR) 25 MG tablet TAKE ONE TABLET BY MOUTH ONCE DAILY. 90 tablet 1  . metoprolol tartrate (LOPRESSOR) 25 MG tablet Take 50 mg ( 2 Tablets) in the AM and 25 mg ( 1 Tablet) in the PM 270 tablet 3  . nitroGLYCERIN (NITROSTAT) 0.4 MG SL tablet PLACE 1 UNDER TONGUE EVERY 5 MINUTES UP TO 3 DOSES AS NEEDED FOR CHEST PAIN. 25 tablet 3   No current facility-administered medications for this visit.     Allergies:   Adhesive [tape]; Demerol [meperidine]; and Fentanyl    Social History:  The patient  reports that he quit smoking about 41 years ago. His smoking use included cigarettes. He started smoking about 47 years ago. He has a 7.00 pack-year smoking history. He has never used smokeless tobacco. He reports that he does not drink alcohol or use drugs.   Family History:  The patient's family history includes Heart failure in his father;  Leukemia in his mother.    ROS:  Please see the history of present illness.   Otherwise, review of systems are positive for none.   All other systems are reviewed and negative.    PHYSICAL EXAM: VS:  BP 124/76 (BP Location: Left Arm, Patient Position: Sitting, Cuff Size: Normal)   Pulse 65   Ht 5\' 6"  (1.676 m)   Wt 187 lb (84.8 kg)   BMI 30.18 kg/m  , BMI Body mass index is 30.18 kg/m. GEN: Well nourished, well developed, in no acute distress  HEENT: normal  Neck: no JVD, left carotid bruit, or masses Cardiac: RRR; no murmurs, rubs, or gallops,no edema  Respiratory:  clear to auscultation bilaterally, normal work of breathing GI: soft, nontender, nondistended, + BS MS: no deformity or atrophy  Skin: warm and dry, no rash Neuro:  Strength and sensation are intact Psych: euthymic mood, full affect   EKG:  EKG is ordered today. The ekg ordered today demonstrates NSR, LAD. Otherwise normal. I have personally reviewed and interpreted this study.    Recent Labs: 12/28/2017: ALT 23; BUN 18; Creatinine, Ser 1.01; Hemoglobin 16.0; Platelets 204; Potassium 4.7; Sodium 141; TSH 3.025    Lipid Panel    Component Value Date/Time   CHOL 209 (H) 12/28/2017 1027   CHOL 152 03/22/2013 0813   TRIG 215 (H) 12/28/2017 1027   TRIG 83 03/22/2013 0813   HDL 38 (L) 12/28/2017 1027   HDL 39 (L) 03/22/2013 0813   CHOLHDL 5.5 12/28/2017 1027   VLDL 43 (H) 12/28/2017 1027   LDLCALC 128 (H) 12/28/2017 1027   LDLCALC 96 03/22/2013 0813      Wt Readings from Last 3 Encounters:  01/01/18 187 lb (84.8 kg)  10/17/17 184 lb (83.5 kg)  10/11/17 184 lb (83.5 kg)      Other studies Reviewed: Additional studies/ records that were reviewed today include:  04/2017 exercise nuclear stress: low risk Duke score of 10. Small basal inferolateral infarct with mild ischemia. Overall low risk    Cardiac cath 09/21/17:Procedures   CORONARY STENT INTERVENTION  INTRAVASCULAR PRESSURE WIRE/FFR STUDY    LEFT HEART CATH AND CORONARY ANGIOGRAPHY  Conclusion     Prox Cx lesion is 60% stenosed.  Previously placed Ost LAD to Prox LAD stent (unknown type) is widely patent.  Prox LAD lesion is 50% stenosed.  Ost RPDA to RPDA lesion is 60% stenosed.  A stent was successfully placed.  Post intervention, there is a 0% residual stenosis.  The left ventricular systolic function is normal.  LV end diastolic pressure is normal.  The left ventricular ejection fraction is 55-65% by visual estimate.     Repeat cardiac cath 09/21/17: Procedures   Coronary/Graft Acute MI Revascularization  LEFT HEART CATH AND CORONARY ANGIOGRAPHY  Conclusion     Non-stenotic Ost LAD to Prox LAD lesion previously treated.  Prox LAD lesion is 50% stenosed.  Previously placed Prox Cx stent (unknown type) is widely patent.  Ost RPDA to RPDA lesion is 60% stenosed.  Ost 3rd Mrg to 3rd Mrg lesion is 50% stenosed.   1. The stent in the mid LCx is widely patent. There is an intimal dissection in the third OM. Careful review of his prior study demonstrates a wire tip induced dissection in this area. Currently there is a long segment of 50% narrowing with TIMI 3 flow. The patient is pain free. No other change in his angiography. I would favor continued medical therapy to allow this intimal dissection to heal. I am concerned that attempt at recrossing with a wire could lead to subintimal selection and worsening of the dissection. Will continue IV heparin and Ntg overnight. Continue DAPT. If he should develop evidence of reocclusion then attempt at recrossing and stenting of the dissection would be necessary.       ASSESSMENT AND PLAN:  1.  CAD s/p remote Anterior STEMI in 2012 treated with DES of LAD. More recent NSTEMI in December 2018. He had DES of LCx with abnormal FFR. Subsequent wire dissection of OM 3 treated medically. He now has persistent chest pain, dyspnea, fatigue. Concern for angina secondary  to OM3 stenosis or other CAD. Prior stress  tests have not been that revealing. It is possible his symptoms could be noncoronary related. We discussed work up. At this point I would start with cardiac cath given recent events in December. Will use femoral approach. If Cardiac cath looks ok will consider other work up including CT angio chest or Echo. Recent labs all look good. The procedure and risks were reviewed including but not limited to death, myocardial infarction, stroke, arrythmias, bleeding, transfusion, emergency surgery, dye allergy, or renal dysfunction. The patient voices understanding and is agreeable to proceed.   2. Hypercholesterolemia. Lipids are not ideal. Intolerance to statins. Will need to consider alternative therapy. He has been resistant to try in the past.  3. HTN.   4. Carotid arterial disease  5. Sinus bradycardia.  6. ? Bicuspid AV.    Current medicines are reviewed at length with the patient today.  The patient does not have concerns regarding medicines.  The following changes have been made:  no change  Labs/ tests ordered today include: PT No orders of the defined types were placed in this encounter.    Disposition:   Proceed with cardiac cath on April 15.   Signed, Trevor Duty [name], MD  01/01/2018 3:59 PM    [facility] [address] [place], [place], [address] Phone [phone], Fax [phone]

## 2018-01-01 ENCOUNTER — Encounter: Payer: Self-pay | Admitting: Cardiology

## 2018-01-01 ENCOUNTER — Ambulatory Visit: Payer: PPO | Admitting: Cardiology

## 2018-01-01 ENCOUNTER — Other Ambulatory Visit: Payer: Self-pay | Admitting: Cardiology

## 2018-01-01 VITALS — BP 124/76 | HR 65 | Ht 66.0 in | Wt 187.0 lb

## 2018-01-01 DIAGNOSIS — I209 Angina pectoris, unspecified: Secondary | ICD-10-CM | POA: Diagnosis not present

## 2018-01-01 DIAGNOSIS — I10 Essential (primary) hypertension: Secondary | ICD-10-CM | POA: Diagnosis not present

## 2018-01-01 DIAGNOSIS — E782 Mixed hyperlipidemia: Secondary | ICD-10-CM

## 2018-01-01 DIAGNOSIS — R079 Chest pain, unspecified: Secondary | ICD-10-CM | POA: Diagnosis not present

## 2018-01-01 DIAGNOSIS — I2510 Atherosclerotic heart disease of native coronary artery without angina pectoris: Secondary | ICD-10-CM | POA: Diagnosis not present

## 2018-01-01 DIAGNOSIS — Z9861 Coronary angioplasty status: Secondary | ICD-10-CM | POA: Diagnosis not present

## 2018-01-01 NOTE — Patient Instructions (Signed)
   [facility] [address] [place] [address] Dept: [phone] Loc: [phone]  CARVEL HUSKINS  01/01/2018  You are scheduled for a Cardiac Cath on Monday 01/08/18, with Dr.Jordan.  1. Please arrive at the [facility] (Main Entrance A) at [facility]: [address] [place], [place] [address] at 8:00 am (two hours before your procedure to ensure your preparation). Free valet parking service is available.   Special note: Every effort is made to have your procedure done on time. Please understand that emergencies sometimes delay scheduled procedures.  2. Diet: NOTHING TO EAT OR DRINK AFTER MIDNIGHT Sunday 4/14.  3. Labs: PT today  4. Medication instructions in preparation for your procedure:    On the morning of your procedure, take your 81 mg Aspirin and Plavix 75 mg and any morning medicines NOT listed above.  You may use sips of water.  5. Plan for one night stay--bring personal belongings. 6. Bring a current list of your medications and current insurance cards. 7. You MUST have a responsible person to drive you home. 8. Someone MUST be with you the first 24 hours after you arrive home or your discharge will be delayed. 9. Please wear clothes that are easy to get on and off and wear slip-on shoes.  Thank you for allowing [place] to care for you!   -- [facility]

## 2018-01-02 LAB — PT AND PTT
INR: 1 (ref 0.8–1.2)
Prothrombin Time: 10.3 s (ref 9.1–12.0)
aPTT: 27 s (ref 24–33)

## 2018-01-09 ENCOUNTER — Telehealth: Payer: Self-pay | Admitting: *Deleted

## 2018-01-09 NOTE — Telephone Encounter (Signed)
Pt contacted pre-catheterization scheduled at [facility] for: Thursday January 11, 2018 12 noon Verified arrival time and place: [place] at: 10 AM Nothing to eat or drink after midnight prior to cath. Verified allergies in Epic.  Verified no diabetes medications   AM meds can be  taken pre-cath with sip of water including: ASA 81 mg  Clopidogrel 75 mg   Confirmed patient has responsible person to drive home post procedure and observe patient for 24 hours: yes

## 2018-01-11 ENCOUNTER — Ambulatory Visit (HOSPITAL_COMMUNITY)
Admission: RE | Disposition: A | Discharge status: Home / Self Care | Payer: Self-pay | Source: Ambulatory Visit | Attending: Cardiology

## 2018-01-11 ENCOUNTER — Ambulatory Visit (HOSPITAL_COMMUNITY)
Admission: RE | Admit: 2018-01-11 | Discharge: 2018-01-11 | Disposition: A | Discharge status: Home / Self Care | Payer: PPO | Source: Ambulatory Visit | Attending: Cardiology | Admitting: Cardiology

## 2018-01-11 DIAGNOSIS — E782 Mixed hyperlipidemia: Secondary | ICD-10-CM | POA: Diagnosis present

## 2018-01-11 DIAGNOSIS — Z7902 Long term (current) use of antithrombotics/antiplatelets: Secondary | ICD-10-CM | POA: Diagnosis not present

## 2018-01-11 DIAGNOSIS — R0789 Other chest pain: Secondary | ICD-10-CM | POA: Insufficient documentation

## 2018-01-11 DIAGNOSIS — Z7982 Long term (current) use of aspirin: Secondary | ICD-10-CM | POA: Diagnosis not present

## 2018-01-11 DIAGNOSIS — R079 Chest pain, unspecified: Secondary | ICD-10-CM | POA: Diagnosis present

## 2018-01-11 DIAGNOSIS — I252 Old myocardial infarction: Secondary | ICD-10-CM | POA: Diagnosis not present

## 2018-01-11 DIAGNOSIS — Z86718 Personal history of other venous thrombosis and embolism: Secondary | ICD-10-CM | POA: Diagnosis not present

## 2018-01-11 DIAGNOSIS — Z8249 Family history of ischemic heart disease and other diseases of the circulatory system: Secondary | ICD-10-CM | POA: Insufficient documentation

## 2018-01-11 DIAGNOSIS — Z888 Allergy status to other drugs, medicaments and biological substances status: Secondary | ICD-10-CM | POA: Diagnosis not present

## 2018-01-11 DIAGNOSIS — Z87891 Personal history of nicotine dependence: Secondary | ICD-10-CM | POA: Insufficient documentation

## 2018-01-11 DIAGNOSIS — Z79899 Other long term (current) drug therapy: Secondary | ICD-10-CM | POA: Diagnosis not present

## 2018-01-11 DIAGNOSIS — I2510 Atherosclerotic heart disease of native coronary artery without angina pectoris: Secondary | ICD-10-CM

## 2018-01-11 DIAGNOSIS — Z885 Allergy status to narcotic agent status: Secondary | ICD-10-CM | POA: Diagnosis not present

## 2018-01-11 DIAGNOSIS — Z955 Presence of coronary angioplasty implant and graft: Secondary | ICD-10-CM | POA: Insufficient documentation

## 2018-01-11 DIAGNOSIS — Z8546 Personal history of malignant neoplasm of prostate: Secondary | ICD-10-CM | POA: Insufficient documentation

## 2018-01-11 DIAGNOSIS — I359 Nonrheumatic aortic valve disorder, unspecified: Secondary | ICD-10-CM | POA: Insufficient documentation

## 2018-01-11 DIAGNOSIS — I6523 Occlusion and stenosis of bilateral carotid arteries: Secondary | ICD-10-CM | POA: Insufficient documentation

## 2018-01-11 DIAGNOSIS — I209 Angina pectoris, unspecified: Secondary | ICD-10-CM | POA: Diagnosis present

## 2018-01-11 DIAGNOSIS — I2584 Coronary atherosclerosis due to calcified coronary lesion: Secondary | ICD-10-CM | POA: Diagnosis not present

## 2018-01-11 DIAGNOSIS — I493 Ventricular premature depolarization: Secondary | ICD-10-CM | POA: Insufficient documentation

## 2018-01-11 DIAGNOSIS — Z9861 Coronary angioplasty status: Secondary | ICD-10-CM

## 2018-01-11 DIAGNOSIS — I10 Essential (primary) hypertension: Secondary | ICD-10-CM | POA: Diagnosis present

## 2018-01-11 HISTORY — PX: LEFT HEART CATH AND CORONARY ANGIOGRAPHY: CATH118249

## 2018-01-11 SURGERY — LEFT HEART CATH AND CORONARY ANGIOGRAPHY
Anesthesia: LOCAL

## 2018-01-11 MED ORDER — SODIUM CHLORIDE 0.9% FLUSH: 3.0000 mL | INTRAVENOUS | Status: DC | PRN

## 2018-01-11 MED ORDER — SODIUM CHLORIDE 0.9 % IV SOLN: 250.0000 mL | INTRAVENOUS | Status: DC | PRN

## 2018-01-11 MED ORDER — MIDAZOLAM HCL 2 MG/2ML IJ SOLN
INTRAMUSCULAR | Status: AC
  Filled 2018-01-11: qty 2

## 2018-01-11 MED ORDER — SODIUM CHLORIDE 0.9 % WEIGHT BASED INFUSION: 1.0000 mL/kg/h | INTRAVENOUS | Status: AC

## 2018-01-11 MED ORDER — IOHEXOL 350 MG/ML SOLN
INTRAVENOUS | Status: DC | PRN
  Administered 2018-01-11: 90 mL

## 2018-01-11 MED ORDER — SODIUM CHLORIDE 0.9% FLUSH: 3.0000 mL | Freq: Two times a day (BID) | INTRAVENOUS | Status: DC

## 2018-01-11 MED ORDER — HEPARIN (PORCINE) IN NACL 1000-0.9 UT/500ML-% IV SOLN
INTRAVENOUS | Status: AC
  Filled 2018-01-11: qty 1000

## 2018-01-11 MED ORDER — MIDAZOLAM HCL 2 MG/2ML IJ SOLN
INTRAMUSCULAR | Status: DC | PRN
  Administered 2018-01-11: 2 mg via INTRAVENOUS

## 2018-01-11 MED ORDER — LIDOCAINE HCL (PF) 1 % IJ SOLN
INTRAMUSCULAR | Status: AC
  Filled 2018-01-11: qty 30

## 2018-01-11 MED ORDER — SODIUM CHLORIDE 0.9 % WEIGHT BASED INFUSION: 1.0000 mL/kg/h | INTRAVENOUS | Status: DC

## 2018-01-11 MED ORDER — HEPARIN (PORCINE) IN NACL 2-0.9 UNITS/ML
INTRAMUSCULAR | Status: AC | PRN
  Administered 2018-01-11 (×2): 500 mL

## 2018-01-11 MED ORDER — ASPIRIN 81 MG PO CHEW: 81.0000 mg | CHEWABLE_TABLET | ORAL | Status: DC

## 2018-01-11 MED ORDER — ONDANSETRON HCL 4 MG/2ML IJ SOLN: 4.0000 mg | Freq: Four times a day (QID) | INTRAMUSCULAR | Status: DC | PRN

## 2018-01-11 MED ORDER — LIDOCAINE HCL (PF) 1 % IJ SOLN
INTRAMUSCULAR | Status: DC | PRN
  Administered 2018-01-11: 15 mL

## 2018-01-11 MED ORDER — SODIUM CHLORIDE 0.9 % WEIGHT BASED INFUSION
3.0000 mL/kg/h | INTRAVENOUS | Status: AC
  Administered 2018-01-11: 3 mL/kg/h via INTRAVENOUS

## 2018-01-11 MED ORDER — ACETAMINOPHEN 325 MG PO TABS: 650.0000 mg | ORAL_TABLET | ORAL | Status: DC | PRN

## 2018-01-11 SURGICAL SUPPLY — 8 items
CATH INFINITI 5FR MULTPACK ANG (CATHETERS) ×1 IMPLANT
KIT HEART LEFT (KITS) ×2 IMPLANT
PACK CARDIAC CATHETERIZATION (CUSTOM PROCEDURE TRAY) ×2 IMPLANT
SHEATH AVANTI 11CM 5FR (SHEATH) ×1 IMPLANT
SYR MEDRAD MARK V 150ML (SYRINGE) ×2 IMPLANT
TRANSDUCER W/STOPCOCK (MISCELLANEOUS) ×2 IMPLANT
TUBING CIL FLEX 10 FLL-RA (TUBING) ×2 IMPLANT
WIRE EMERALD 3MM-J .035X150CM (WIRE) ×1 IMPLANT

## 2018-01-11 NOTE — Progress Notes (Signed)
Site area: rt groin fa sheath Site Prior to Removal:  Level 0 Pressure Applied For: 20 minutes Manual:   yes Patient Status During Pull:  stable Post Pull Site:  Level  0 Post Pull Instructions Given:  yes Post Pull Pulses Present: palpable  Dressing Applied:  Gauze and tegaderm Bedrest begins @ 1430 Comments:   

## 2018-01-11 NOTE — Discharge Instructions (Signed)

## 2018-01-11 NOTE — Interval H&P Note (Signed)
dHistory and Physical Interval Note:  01/11/2018 1:12 PM  [name]  has presented today for surgery, with the diagnosis of cp  The various methods of treatment have been discussed with the patient and family. After consideration of risks, benefits and other options for treatment, the patient has consented to  Procedure(s): LEFT HEART CATH AND CORONARY ANGIOGRAPHY (N/A) as a surgical intervention .  The patient's history has been reviewed, patient examined, no change in status, stable for surgery.  I have reviewed the patient's chart and labs.  Questions were answered to the patient's satisfaction.   Cath Lab Visit (complete for each Cath Lab visit)  Clinical Evaluation Leading to the Procedure:   ACS: No.  Non-ACS:    Anginal Classification: CCS III  Anti-ischemic medical therapy: Maximal Therapy (2 or more classes of medications)  Non-Invasive Test Results: No non-invasive testing performed  Prior CABG: No previous CABG        [name] [facility] 01/11/2018 1:12 PM

## 2018-01-12 ENCOUNTER — Encounter (HOSPITAL_COMMUNITY): Payer: Self-pay | Admitting: Cardiology

## 2018-01-12 MED FILL — Heparin Sod (Porcine)-NaCl IV Soln 1000 Unit/500ML-0.9%: INTRAVENOUS | Qty: 1000 | Status: AC

## 2018-01-17 ENCOUNTER — Encounter: Payer: Self-pay | Admitting: Cardiology

## 2018-01-28 NOTE — Progress Notes (Signed)
Cardiology Office Note   Date:  01/31/2018   ID:  Kyle, Sheppard 1951/07/09, MRN [id number]  PCP:  [name], MD  Cardiologist:  [name], MD   Chief Complaint  Patient presents with  . Follow-up  . Shortness of Breath    Some times.  . Edema    Right hands still swell after cath.      History of Present Illness: [name] is a 67 y.o. male who presents for follow up CAD. 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 Later had severe pain in his chest, back, and jaw. He was transferred to [facility] for further evaluation. Cath done 12/27 by Dr [name] revealed a patent LAD stent with a mLAD 50%, 60% mCFX, and 60% PDA- diffuse. The PDA disease was unchanged from 2012.  The CFX lesion was significant by FFR and he underwent PCI with DES. Later that night he had recurrent chest pain and was taken back to the lab. Cath revealed patent CFX stent with dissection in the OM3 with TIM 3 flow felt to be related to wire dissection.  Plan was for medical Rx. I felt  that attempt at recrossing with a wire could lead to subintimal selection and worsening of the dissection. He was managed medically.  Due to persistent symptoms he underwent repeat cardiac cath on 01/11/18. This showed both prior stents were widely patent. The wire dissection had healed completely. LV function and LVEDP were normal. On follow up today he denies any problems with his groin site. His chest pain is much better. He still has random episodes of SOB.   Past Medical History:  Diagnosis Date  . Abnormality of aortic valve     a. possibly bicuspid.  . Asthma    Childhood  . Carotid artery disease ([place])    a. 1-39% bilaterally in 2017.  Marland Kitchen Coronary artery disease    a. anterior STEMI 04/2011, s/p DES to LAD.  Marland Kitchen DVT (deep venous thrombosis) (H[facility])    a. tx with Xarelto x 5 months, stopped due to hematuria.  . Frequent PVCs    a. seen on Holter monitor.  . Hyperlipidemia, mixed   . Hypertension   . Myocardial infarction ([place]) 05/21/2011   anterior wall, treated by Dr. [name] with 3.0x51mm Promus drug eluting stent to proximal LAD   . Peripheral arterial disease (H[facility])    moderate left ICA disease, mild right ICA disease  . Prostate cancer ([place]) 2007  . Sinus bradycardia    a. HR 50s at home.  . Statin intolerance     Past Surgical History:  Procedure Laterality Date  . CARDIAC CATHETERIZATION  05/23/2011, 05/21/2011   05/21/2011   3.0 x 18mm Promus Element drug eluting stent positioned in proximal LAD, stenosis taken from 99% to 0%.  . CORONARY STENT INTERVENTION N/A 09/21/2017   Procedure: CORONARY STENT INTERVENTION;  Surgeon: [name], MD;  Location: [place] [facility];  Service: Cardiovascular;  Laterality: N/A;  . CORONARY/GRAFT ACUTE MI REVASCULARIZATION N/A 09/21/2017   Procedure: Coronary/Graft Acute MI Revascularization;  Surgeon: [name], [name], MD;  Location: [place] [facility];  Service: Cardiovascular;  Laterality: N/A;  . HERNIA REPAIR    . INTRAVASCULAR PRESSURE WIRE/FFR STUDY N/A 09/21/2017   Procedure: INTRAVASCULAR PRESSURE WIRE/FFR STUDY;  Surgeon: [name], MD;  Location: [place] [facility];  Service: Cardiovascular;  Laterality: N/A;  . LEFT HEART CATH AND CORONARY ANGIOGRAPHY N/A 09/21/2017   Procedure: LEFT HEART CATH AND CORONARY ANGIOGRAPHY;  Surgeon: [name], [name], MD;  Location: [place] [facility];  Service: Cardiovascular;  Laterality: N/A;  . LEFT HEART CATH AND CORONARY ANGIOGRAPHY N/A 09/21/2017   Procedure: LEFT HEART CATH AND CORONARY  ANGIOGRAPHY;  Surgeon: [name], MD;  Location: [place] [facility];  Service: Cardiovascular;  Laterality: N/A;  . LEFT HEART CATH AND CORONARY ANGIOGRAPHY N/A 01/11/2018   Procedure: LEFT HEART CATH AND CORONARY ANGIOGRAPHY;  Surgeon: [name], [name], MD;  Location: [place] [facility];  Service: Cardiovascular;  Laterality: N/A;  . PROSTATECTOMY  2007     Current Outpatient Medications  Medication Sig Dispense Refill  . acetaminophen (TYLENOL) 650 MG CR tablet Take 650 mg by mouth every 8 (eight) hours as needed for pain.    Marland Kitchen amLODipine (NORVASC) 2.5 MG tablet TAKE (1) TABLET BY MOUTH ONCE DAILY. (Patient taking differently: TAKE 2.5 MG BY MOUTH ONCE DAILY.) 90 tablet 1  . aspirin 500 MG tablet Take 81 mg by mouth every 6 (six) hours as needed for pain.     Marland Kitchen clopidogrel (PLAVIX) 75 MG tablet Take 1 tablet (75 mg total) by mouth daily. 90 tablet 3  . Coenzyme Q10 (CO Q-10) 100 MG CAPS Take 100 mg by mouth daily.     Marland Kitchen losartan (COZAAR) 25 MG tablet TAKE ONE TABLET BY MOUTH ONCE DAILY. (Patient taking differently: TAKE 25 MG BY MOUTH ONCE DAILY.) 90 tablet 1  . metoprolol tartrate (LOPRESSOR) 25 MG tablet Take 50 mg ( 2 Tablets) in the AM and 25 mg ( 1 Tablet) in the PM (Patient taking differently: Take 25 mg by mouth 2 (two) times daily. ) 270 tablet 3  . Multiple Vitamins-Minerals (MULTIVITAMIN PO) Take 1 tablet by mouth daily.    . nitroGLYCERIN (NITROSTAT) 0.4 MG SL tablet PLACE 1 UNDER TONGUE EVERY 5 MINUTES UP TO 3 DOSES AS NEEDED FOR CHEST PAIN. (Patient taking differently: PLACE 0.4 MG UNDER TONGUE EVERY 5 MINUTES UP TO 3 DOSES AS NEEDED FOR CHEST PAIN.) 25 tablet 3   No current facility-administered medications for this visit.     Allergies:   Adhesive [tape]; Demerol [meperidine]; Fentanyl; and Statins    Social History:  The patient  reports that he quit smoking about 41 years ago. His smoking use included cigarettes. He started smoking about 47 years ago. He has a 7.00  pack-year smoking history. He has never used smokeless tobacco. He reports that he does not drink alcohol or use drugs.   Family History:  The patient's family history includes Heart failure in his father; Leukemia in his mother.    ROS:  Please see the history of present illness.   Otherwise, review of systems are positive for none.   All other systems are reviewed and negative.    PHYSICAL EXAM: VS:  BP 124/74 (BP Location: Left Arm, Patient Position: Sitting, Cuff Size: Normal)   Pulse 62   Ht 5\' 6"  (1.676 m)   Wt 183 lb (83 kg)   BMI 29.54 kg/m  , BMI Body mass index  is 29.54 kg/m. GENERAL:  Well appearing WM in NAD HEENT:  PERRL, EOMI, sclera are clear. Oropharynx is clear. NECK:  No jugular venous distention, carotid upstroke brisk and symmetric, no bruits, no thyromegaly or adenopathy LUNGS:  Clear to auscultation bilaterally CHEST:  Unremarkable HEART:  RRR,  PMI not displaced or sustained,S1 and S2 within normal limits, no S3, no S4: no clicks, no rubs, no murmurs ABD:  Soft, nontender. BS +, no masses or bruits. No hepatomegaly, no splenomegaly EXT:  2 + pulses throughout, no edema, no cyanosis no clubbing SKIN:  Warm and dry.  No rashes NEURO:  Alert and oriented x 3. Cranial nerves II through XII intact. PSYCH:  Cognitively intact  Recent Labs: 12/28/2017: ALT 23; BUN 18; Creatinine, Ser 1.01; Hemoglobin 16.0; Platelets 204; Potassium 4.7; Sodium 141; TSH 3.025    Lipid Panel    Component Value Date/Time   CHOL 209 (H) 12/28/2017 1027   CHOL 152 03/22/2013 0813   TRIG 215 (H) 12/28/2017 1027   TRIG 83 03/22/2013 0813   HDL 38 (L) 12/28/2017 1027   HDL 39 (L) 03/22/2013 0813   CHOLHDL 5.5 12/28/2017 1027   VLDL 43 (H) 12/28/2017 1027   LDLCALC 128 (H) 12/28/2017 1027   LDLCALC 96 03/22/2013 0813      Wt Readings from Last 3 Encounters:  01/31/18 183 lb (83 kg)  01/11/18 187 lb (84.8 kg)  01/01/18 187 lb (84.8 kg)      Other studies Reviewed: Additional  studies/ records that were reviewed today include:  04/2017 exercise nuclear stress: low risk Duke score of 10. Small basal inferolateral infarct with mild ischemia. Overall low risk  Cardiac cath 09/21/17:Procedures   CORONARY STENT INTERVENTION  INTRAVASCULAR PRESSURE WIRE/FFR STUDY  LEFT HEART CATH AND CORONARY ANGIOGRAPHY  Conclusion     Prox Cx lesion is 60% stenosed.  Previously placed Ost LAD to Prox LAD stent (unknown type) is widely patent.  Prox LAD lesion is 50% stenosed.  Ost RPDA to RPDA lesion is 60% stenosed.  A stent was successfully placed.  Post intervention, there is a 0% residual stenosis.  The left ventricular systolic function is normal.  LV end diastolic pressure is normal.  The left ventricular ejection fraction is 55-65% by visual estimate.     Repeat cardiac cath 09/21/17: Procedures   Coronary/Graft Acute MI Revascularization  LEFT HEART CATH AND CORONARY ANGIOGRAPHY  Conclusion     Non-stenotic Ost LAD to Prox LAD lesion previously treated.  Prox LAD lesion is 50% stenosed.  Previously placed Prox Cx stent (unknown type) is widely patent.  Ost RPDA to RPDA lesion is 60% stenosed.  Ost 3rd Mrg to 3rd Mrg lesion is 50% stenosed.   1. The stent in the mid LCx is widely patent. There is an intimal dissection in the third OM. Careful review of his prior study demonstrates a wire tip induced dissection in this area. Currently there is a long segment of 50% narrowing with TIMI 3 flow. The patient is pain free. No other change in his angiography. I would favor continued medical therapy to allow this intimal dissection to heal. I am concerned that attempt at recrossing with a wire could lead to subintimal selection and worsening of the dissection. Will continue IV heparin and Ntg overnight. Continue DAPT. If he should develop evidence of reocclusion then attempt at recrossing and stenting of the dissection would be necessary.     Cardiac cath  01/11/18: Procedures   LEFT HEART CATH  AND CORONARY ANGIOGRAPHY  Conclusion     Non-stenotic Ost LAD to Prox LAD lesion previously treated.  Prox LAD lesion is 45% stenosed.  Non-stenotic Prox Cx lesion previously treated.  Ost RPDA to RPDA lesion is 60% stenosed.  The left ventricular systolic function is normal.  LV end diastolic pressure is normal.  The left ventricular ejection fraction is 55-65% by visual estimate.   1. Nonobstructive CAD. The prior stents in the LAD and LCx are widely patent. The prior wire dissection in the OM3 has healed completely 2. Normal LV function 3. Normal LVEDP  Plan: continue medical therapy. Consider alternative causes for his symptoms.      ASSESSMENT AND PLAN:  1.  CAD s/p remote Anterior STEMI in 2012 treated with DES of LAD. More recent NSTEMI in December 2018. He had DES of LCx with abnormal FFR. Subsequent wire dissection of OM 3 treated medically.  Repeat cardiac cath showed nonobstructive disease and healed wire dissection of OM3. Fortunately his chest pain symptoms have improved. Will continue medical therapy  2. Hypercholesterolemia. Lipids are not ideal. Intolerance to statins. He is resistant to trying other therapy. Had extensive discussion of other lipid lowering drugs including Zetia or PCSK 9 inhibitors. He is concerned about potential side effects and cost. He is going to research the PCSK 9 inhibitors. I offered him to talk to our Pharm D about this. I explained this is something we should consider to lower his risk for future heart attacks.   3. HTN.   4. Carotid arterial disease  5. Sinus bradycardia.   Disposition:   Follow up in 6 months.  Signed, Kyrstal Monterrosa [name], MD  01/31/2018 12:02 PM    [place] [address], [place], [place], [address] Phone [phone], Fax [phone]

## 2018-01-31 ENCOUNTER — Encounter: Payer: Self-pay | Admitting: Cardiology

## 2018-01-31 ENCOUNTER — Ambulatory Visit: Payer: PPO | Admitting: Cardiology

## 2018-01-31 VITALS — BP 124/74 | HR 62 | Ht 66.0 in | Wt 183.0 lb

## 2018-01-31 DIAGNOSIS — Z9861 Coronary angioplasty status: Secondary | ICD-10-CM | POA: Diagnosis not present

## 2018-01-31 DIAGNOSIS — I2510 Atherosclerotic heart disease of native coronary artery without angina pectoris: Secondary | ICD-10-CM

## 2018-01-31 DIAGNOSIS — I10 Essential (primary) hypertension: Secondary | ICD-10-CM | POA: Diagnosis not present

## 2018-01-31 DIAGNOSIS — I209 Angina pectoris, unspecified: Secondary | ICD-10-CM

## 2018-01-31 DIAGNOSIS — E782 Mixed hyperlipidemia: Secondary | ICD-10-CM | POA: Diagnosis not present

## 2018-02-01 ENCOUNTER — Ambulatory Visit: Payer: PPO | Admitting: Cardiology

## 2018-05-12 ENCOUNTER — Other Ambulatory Visit: Payer: Self-pay | Admitting: Cardiology

## 2018-05-14 ENCOUNTER — Other Ambulatory Visit: Payer: Self-pay | Admitting: Cardiology

## 2018-05-15 DIAGNOSIS — L03116 Cellulitis of left lower limb: Secondary | ICD-10-CM | POA: Diagnosis not present

## 2018-05-16 DIAGNOSIS — J019 Acute sinusitis, unspecified: Secondary | ICD-10-CM | POA: Diagnosis not present

## 2018-05-16 DIAGNOSIS — L03116 Cellulitis of left lower limb: Secondary | ICD-10-CM | POA: Diagnosis not present

## 2018-06-16 ENCOUNTER — Other Ambulatory Visit: Payer: Self-pay | Admitting: Cardiology

## 2018-07-12 ENCOUNTER — Encounter: Payer: PPO | Admitting: *Deleted

## 2018-07-12 DIAGNOSIS — Z006 Encounter for examination for normal comparison and control in clinical research program: Secondary | ICD-10-CM

## 2018-07-12 NOTE — Research (Signed)
Subject met inclusion and exclusion criteria.  The informed consent form, study requirements and expectations were reviewed with the subject and questions and concerns were addressed prior to the signing of the consent form.  The subject verbalized understanding of the trial requirements.  The subject agreed to participate in the [facility] 4 trial and signed the informed consent.  The informed consent was obtained prior to performance of any protocol-specific procedures for the subject.  A copy of the signed informed consent was given to the subject and a copy was placed in the subject's medical record.  Subject passed cholesterol screening with result of TC 172.  Run in injection given and next appointment scheduled. 

## 2018-07-21 NOTE — Progress Notes (Signed)
Cardiology Office Note   Date:  07/31/2018   ID:  Graden, Hoshino 13-Jan-1951, MRN [id number]  PCP:  [name], MD  Cardiologist:  Peter [name], MD   Chief Complaint  Patient presents with  . Chest Pain  . Coronary Artery Disease      History of Present Illness: [name] is a 67 y.o. male who presents for follow up CAD. 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 Later had severe pain in his chest, back, and jaw. He was transferred to [facility] for further evaluation. Cath done 12/27 by Dr [name] revealed a patent LAD stent with a mLAD 50%, 60% mCFX, and 60% PDA- diffuse. The PDA disease was unchanged from 2012.  The CFX lesion was significant by FFR and he underwent PCI with DES. Later that night he had recurrent chest pain and was taken back to the lab. Cath revealed patent CFX stent with dissection in the OM3 with TIM 3 flow felt to be related to wire dissection.  Plan was for medical Rx. I felt  that attempt at recrossing with a wire could lead to subintimal selection and worsening of the dissection. He was managed medically.  Due to persistent symptoms he underwent repeat cardiac cath on 01/11/18. This showed both prior stents were widely patent. The wire dissection had healed completely. LV function and LVEDP were normal.   He recently enrolled in the [place] 4 trial.   In September he reports he had swelling and redness of his left leg associated with fever. Treated with antibiotics with resolution.  On follow up today he states he is not doing well. For the last 3 weeks he has  noted increased exertional angina. Prior to this he was exercising 3 hours/day 5 days a week. Walking 5 miles with only minimal pain. Over the past 3 weeks he states he will start with an elliptical machine and will get chest pain within 10 minutes and has to stop. Pain is acute and radiates to his jaw and down both arms. He stopped exercising for one week but when he tried to go back to it his symptoms returned. On one occasion he felt like he was having a heart attack. Also notes SOB with exertion such as clearing brush. Symptoms are relieved with rest.   Past Medical History:  Diagnosis Date  . Abnormality of aortic valve    a. possibly bicuspid.  . Asthma    Childhood  . Carotid artery disease ([place])    a. 1-39% bilaterally in 2017.  Marland Kitchen Coronary artery disease    a. anterior STEMI 04/2011, s/p DES to LAD.  Marland Kitchen DVT (deep venous thrombosis) ([facility])    a. tx with Xarelto x 5 months, stopped due to hematuria.  . Frequent PVCs    a. seen on Holter monitor.  . Hyperlipidemia, mixed   . Hypertension   . Myocardial infarction ([place]) 05/21/2011   anterior wall, treated by Dr. [name] with 3.0x45mm Promus drug eluting stent to proximal LAD   . Peripheral arterial disease ([facility])    moderate left  ICA disease, mild right ICA disease  . Prostate cancer ([place]) 2007  . Sinus bradycardia    a. HR 50s at home.  . Statin intolerance     Past Surgical History:  Procedure Laterality Date  . CARDIAC CATHETERIZATION  05/23/2011, 05/21/2011   05/21/2011   3.0 x 36mm Promus Element drug eluting stent positioned in proximal LAD, stenosis taken from 99% to 0%.  . CORONARY STENT INTERVENTION N/A 09/21/2017   Procedure: CORONARY STENT INTERVENTION;  Surgeon: [name], MD;  Location: [facility];  Service: Cardiovascular;  Laterality: N/A;  . CORONARY/GRAFT ACUTE MI REVASCULARIZATION N/A 09/21/2017   Procedure: Coronary/Graft Acute MI Revascularization;  Surgeon: [name], Peter M, MD;  Location:  [place] [facility];  Service: Cardiovascular;  Laterality: N/A;  . HERNIA REPAIR    . INTRAVASCULAR PRESSURE WIRE/FFR STUDY N/A 09/21/2017   Procedure: INTRAVASCULAR PRESSURE WIRE/FFR STUDY;  Surgeon: [name], MD;  Location: [facility] [facility];  Service: Cardiovascular;  Laterality: N/A;  . LEFT HEART CATH AND CORONARY ANGIOGRAPHY N/A 09/21/2017   Procedure: LEFT HEART CATH AND CORONARY ANGIOGRAPHY;  Surgeon: [name], Peter M, MD;  Location: [place] [facility];  Service: Cardiovascular;  Laterality: N/A;  . LEFT HEART CATH AND CORONARY ANGIOGRAPHY N/A 09/21/2017   Procedure: LEFT HEART CATH AND CORONARY ANGIOGRAPHY;  Surgeon: [name], MD;  Location: [place] [facility];  Service: Cardiovascular;  Laterality: N/A;  . LEFT HEART CATH AND CORONARY ANGIOGRAPHY N/A 01/11/2018   Procedure: LEFT HEART CATH AND CORONARY ANGIOGRAPHY;  Surgeon: [name], Peter M, MD;  Location: [place] [facility];  Service: Cardiovascular;  Laterality: N/A;  . PROSTATECTOMY  2007     Current Outpatient Medications  Medication Sig Dispense Refill  . amLODipine (NORVASC) 2.5 MG tablet Take 1 tablet (2.5 mg total) by mouth daily. 90 tablet 0  . aspirin 81 MG tablet Take 81 mg by mouth every 6 (six) hours as needed for pain.     Marland Kitchen clopidogrel (PLAVIX) 75 MG tablet TAKE 1 TABLET BY MOUTH ONCE A DAY. 90 tablet 0  . Coenzyme Q10 (CO Q-10) 100 MG CAPS Take 100 mg by mouth daily.     Marland Kitchen losartan (COZAAR) 25 MG tablet TAKE ONE TABLET BY MOUTH ONCE DAILY. 90 tablet 1  . metoprolol tartrate (LOPRESSOR) 25 MG tablet Take 50 mg ( 2 Tablets) in the AM and 25 mg ( 1 Tablet) in the PM (Patient taking differently: Take 25 mg by mouth 2 (two) times daily. ) 270 tablet 3  . Multiple Vitamins-Minerals (MULTIVITAMIN PO) Take 1 tablet by mouth daily.    Marland Kitchen acetaminophen (TYLENOL) 650 MG CR tablet Take 650 mg by mouth every 8 (eight) hours as needed for pain.    . nitroGLYCERIN (NITROSTAT) 0.4 MG SL tablet PLACE 1 UNDER TONGUE  EVERY 5 MINUTES UP TO 3 DOSES AS NEEDED FOR CHEST PAIN. (Patient not taking: Reported on 07/31/2018) 25 tablet 3   No current facility-administered medications for this visit.     Allergies:   Adhesive [tape]; Demerol [meperidine]; Fentanyl; and Statins    Social History:  The patient  reports that he quit smoking about 41 years ago. His smoking use included cigarettes. He started smoking about 48 years ago. He has a 7.00 pack-year smoking history. He has never used smokeless tobacco. He reports that he does not drink alcohol or use drugs.   Family History:  The patient's family history includes Heart failure in his father;  Leukemia in his mother.    ROS:  Please see the history of present illness.   Otherwise, review of systems are positive for none.   All other systems are reviewed and negative.    PHYSICAL EXAM: VS:  BP 119/67   Pulse (!) 55   Ht 5\' 6"  (1.676 m)   Wt 184 lb 3.2 oz (83.6 kg)   BMI 29.73 kg/m  , BMI Body mass index is 29.73 kg/m. GENERAL:  Well appearing WM in NAD HEENT:  PERRL, EOMI, sclera are clear. Oropharynx is clear. NECK:  No jugular venous distention, carotid upstroke brisk and symmetric, no bruits, no thyromegaly or adenopathy LUNGS:  Clear to auscultation bilaterally CHEST:  Unremarkable HEART:  RRR,  PMI not displaced or sustained,S1 and S2 within normal limits, no S3, no S4: no clicks, no rubs, no murmurs ABD:  Soft, nontender. BS +, no masses or bruits. No hepatomegaly, no splenomegaly EXT:  2 + pulses throughout, no edema, no cyanosis no clubbing SKIN:  Warm and dry.  No rashes NEURO:  Alert and oriented x 3. Cranial nerves II through XII intact. PSYCH:  Cognitively intact    Recent Labs: 12/28/2017: ALT 23; BUN 18; Creatinine, Ser 1.01; Hemoglobin 16.0; Platelets 204; Potassium 4.7; Sodium 141; TSH 3.025    Lipid Panel    Component Value Date/Time   CHOL 209 (H) 12/28/2017 1027   CHOL 152 03/22/2013 0813   TRIG 215 (H) 12/28/2017 1027    TRIG 83 03/22/2013 0813   HDL 38 (L) 12/28/2017 1027   HDL 39 (L) 03/22/2013 0813   CHOLHDL 5.5 12/28/2017 1027   VLDL 43 (H) 12/28/2017 1027   LDLCALC 128 (H) 12/28/2017 1027   LDLCALC 96 03/22/2013 0813      Wt Readings from Last 3 Encounters:  07/31/18 184 lb 3.2 oz (83.6 kg)  01/31/18 183 lb (83 kg)  01/11/18 187 lb (84.8 kg)    Ecg today shows sinus brady rate 54. Otherwise normal. I have personally reviewed and interpreted this study.   Other studies Reviewed: Additional studies/ records that were reviewed today include:  04/2017 exercise nuclear stress: low risk Duke score of 10. Small basal inferolateral infarct with mild ischemia. Overall low risk  Cardiac cath 09/21/17:Procedures   CORONARY STENT INTERVENTION  INTRAVASCULAR PRESSURE WIRE/FFR STUDY  LEFT HEART CATH AND CORONARY ANGIOGRAPHY  Conclusion     Prox Cx lesion is 60% stenosed.  Previously placed Ost LAD to Prox LAD stent (unknown type) is widely patent.  Prox LAD lesion is 50% stenosed.  Ost RPDA to RPDA lesion is 60% stenosed.  A stent was successfully placed.  Post intervention, there is a 0% residual stenosis.  The left ventricular systolic function is normal.  LV end diastolic pressure is normal.  The left ventricular ejection fraction is 55-65% by visual estimate.     Repeat cardiac cath 09/21/17: Procedures   Coronary/Graft Acute MI Revascularization  LEFT HEART CATH AND CORONARY ANGIOGRAPHY  Conclusion     Non-stenotic Ost LAD to Prox LAD lesion previously treated.  Prox LAD lesion is 50% stenosed.  Previously placed Prox Cx stent (unknown type) is widely patent.  Ost RPDA to RPDA lesion is 60% stenosed.  Ost 3rd Mrg to 3rd Mrg lesion is 50% stenosed.   1. The stent in the mid LCx is widely patent. There is an intimal dissection in the third OM. Careful review of his prior study demonstrates a wire tip induced dissection in this area. Currently there  is a long segment of  50% narrowing with TIMI 3 flow. The patient is pain free. No other change in his angiography. I would favor continued medical therapy to allow this intimal dissection to heal. I am concerned that attempt at recrossing with a wire could lead to subintimal selection and worsening of the dissection. Will continue IV heparin and Ntg overnight. Continue DAPT. If he should develop evidence of reocclusion then attempt at recrossing and stenting of the dissection would be necessary.     Cardiac cath 01/11/18: Procedures   LEFT HEART CATH AND CORONARY ANGIOGRAPHY  Conclusion     Non-stenotic Ost LAD to Prox LAD lesion previously treated.  Prox LAD lesion is 45% stenosed.  Non-stenotic Prox Cx lesion previously treated.  Ost RPDA to RPDA lesion is 60% stenosed.  The left ventricular systolic function is normal.  LV end diastolic pressure is normal.  The left ventricular ejection fraction is 55-65% by visual estimate.   1. Nonobstructive CAD. The prior stents in the LAD and LCx are widely patent. The prior wire dissection in the OM3 has healed completely 2. Normal LV function 3. Normal LVEDP  Plan: continue medical therapy. Consider alternative causes for his symptoms.      ASSESSMENT AND PLAN:  1.  Unstable angina. Recent onset of worsening exertional angina. Reproducible and similar to prior cardiac pain. CAD s/p remote Anterior STEMI in 2012 treated with DES of LAD. More recent NSTEMI in December 2018. He had DES of LCx with abnormal FFR. Subsequent wire dissection of OM 3 treated medically.  Repeat cardiac cath this April showed nonobstructive disease and healed wire dissection of OM3. Given his recent symptoms I recommend repeat angiography since his symptoms are progressive and worrisome for plaque rupture. I don't a stress test would be very helpful. He is on optimal medical therapy with 2 antianginal drugs. The procedure and risks were reviewed including but not limited to death,  myocardial infarction, stroke, arrythmias, bleeding, transfusion, emergency surgery, dye allergy, or renal dysfunction. The patient voices understanding and is agreeable to proceed..  2. Hypercholesterolemia. Lipids are not ideal. Intolerance to statins. Now enrolled in the [place] 4 trial.  3. HTN.   4. Carotid arterial disease  5. Sinus bradycardia.   Labs will be drawn today.   Signed, Peter [name], MD  07/31/2018 8:57 AM    [facility] [address], [place], [place], [address] Phone [phone], Fax [phone]

## 2018-07-21 NOTE — H&P (View-Only) (Signed)
Cardiology Office Note   Date:  07/31/2018   ID:  Frances, Joynt [birth year], MRN [id number]  PCP:  [name], MD  Cardiologist:  Vyncent Overby [name], MD   Chief Complaint  Patient presents with  . Chest Pain  . Coronary Artery Disease      History of Present Illness: [name] is a 67 y.o. male who presents for follow up CAD. 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 Later had severe pain in his chest, back, and jaw. He was transferred to [facility] for further evaluation. Cath done 12/27 by Dr [name] revealed a patent LAD stent with a mLAD 50%, 60% mCFX, and 60% PDA- diffuse. The PDA disease was unchanged from 2012.  The CFX lesion was significant by FFR and he underwent PCI with DES. Later that night he had recurrent chest pain and was taken back to the lab. Cath revealed patent CFX stent with dissection in the OM3 with TIM 3 flow felt to be related to wire dissection.  Plan was for medical Rx. I felt  that attempt at recrossing with a wire could lead to subintimal selection and worsening of the dissection. He was managed medically.  Due to persistent symptoms he underwent repeat cardiac cath on 01/11/18. This showed both prior stents were widely patent. The wire dissection had healed completely. LV function and LVEDP were normal.   He recently enrolled in the [place] 4 trial.   In September he reports he had swelling and redness of his left leg associated with fever. Treated with antibiotics with resolution.  On follow up today he states he is not doing well. For the last 3 weeks he has  noted increased exertional angina. Prior to this he was exercising 3 hours/day 5 days a week. Walking 5 miles with only minimal pain. Over the past 3 weeks he states he will start with an elliptical machine and will get chest pain within 10 minutes and has to stop. Pain is acute and radiates to his jaw and down both arms. He stopped exercising for one week but when he tried to go back to it his symptoms returned. On one occasion he felt like he was having a heart attack. Also notes SOB with exertion such as clearing brush. Symptoms are relieved with rest.   Past Medical History:  Diagnosis Date  . Abnormality of aortic valve    a. possibly bicuspid.  . Asthma    Childhood  . Carotid artery disease ([place])    a. 1-39% bilaterally in 2017.  Marland Kitchen Coronary artery disease    a. anterior STEMI 04/2011, s/p DES to LAD.  Marland Kitchen DVT (deep venous thrombosis) (HCC)    a. tx with Xarelto x 5 months, stopped due to hematuria.  . Frequent PVCs    a. seen on Holter monitor.  . Hyperlipidemia, mixed   . Hypertension   . Myocardial infarction ([place]) 05/21/2011   anterior wall, treated by Dr. [name] with 3.0x25mm Promus drug eluting stent to proximal LAD   . Peripheral arterial disease (HCC)    moderate left  ICA disease, mild right ICA disease  . Prostate cancer ([place]) 2007  . Sinus bradycardia    a. HR 50s at home.  . Statin intolerance     Past Surgical History:  Procedure Laterality Date  . CARDIAC CATHETERIZATION  05/23/2011, 05/21/2011   05/21/2011   3.0 x 52mm Promus Element drug eluting stent positioned in proximal LAD, stenosis taken from 99% to 0%.  . CORONARY STENT INTERVENTION N/A 09/21/2017   Procedure: CORONARY STENT INTERVENTION;  Surgeon: [name], MD;  Location: [place] [facility];  Service: Cardiovascular;  Laterality: N/A;  . CORONARY/GRAFT ACUTE MI REVASCULARIZATION N/A 09/21/2017   Procedure: Coronary/Graft Acute MI Revascularization;  Surgeon: [name], Lyra Alaimo M, MD;  Location:  [place] [facility];  Service: Cardiovascular;  Laterality: N/A;  . HERNIA REPAIR    . INTRAVASCULAR PRESSURE WIRE/FFR STUDY N/A 09/21/2017   Procedure: INTRAVASCULAR PRESSURE WIRE/FFR STUDY;  Surgeon: [name], MD;  Location: [place] [facility];  Service: Cardiovascular;  Laterality: N/A;  . LEFT HEART CATH AND CORONARY ANGIOGRAPHY N/A 09/21/2017   Procedure: LEFT HEART CATH AND CORONARY ANGIOGRAPHY;  Surgeon: [name], Fransheska Willingham M, MD;  Location: [place] [facility];  Service: Cardiovascular;  Laterality: N/A;  . LEFT HEART CATH AND CORONARY ANGIOGRAPHY N/A 09/21/2017   Procedure: LEFT HEART CATH AND CORONARY ANGIOGRAPHY;  Surgeon: [name], MD;  Location: [place] [facility];  Service: Cardiovascular;  Laterality: N/A;  . LEFT HEART CATH AND CORONARY ANGIOGRAPHY N/A 01/11/2018   Procedure: LEFT HEART CATH AND CORONARY ANGIOGRAPHY;  Surgeon: [name], Broadus Costilla M, MD;  Location: [place] [facility];  Service: Cardiovascular;  Laterality: N/A;  . PROSTATECTOMY  2007     Current Outpatient Medications  Medication Sig Dispense Refill  . amLODipine (NORVASC) 2.5 MG tablet Take 1 tablet (2.5 mg total) by mouth daily. 90 tablet 0  . aspirin 81 MG tablet Take 81 mg by mouth every 6 (six) hours as needed for pain.     Marland Kitchen clopidogrel (PLAVIX) 75 MG tablet TAKE 1 TABLET BY MOUTH ONCE A DAY. 90 tablet 0  . Coenzyme Q10 (CO Q-10) 100 MG CAPS Take 100 mg by mouth daily.     Marland Kitchen losartan (COZAAR) 25 MG tablet TAKE ONE TABLET BY MOUTH ONCE DAILY. 90 tablet 1  . metoprolol tartrate (LOPRESSOR) 25 MG tablet Take 50 mg ( 2 Tablets) in the AM and 25 mg ( 1 Tablet) in the PM (Patient taking differently: Take 25 mg by mouth 2 (two) times daily. ) 270 tablet 3  . Multiple Vitamins-Minerals (MULTIVITAMIN PO) Take 1 tablet by mouth daily.    Marland Kitchen acetaminophen (TYLENOL) 650 MG CR tablet Take 650 mg by mouth every 8 (eight) hours as needed for pain.    . nitroGLYCERIN (NITROSTAT) 0.4 MG SL tablet PLACE 1 UNDER TONGUE  EVERY 5 MINUTES UP TO 3 DOSES AS NEEDED FOR CHEST PAIN. (Patient not taking: Reported on 07/31/2018) 25 tablet 3   No current facility-administered medications for this visit.     Allergies:   Adhesive [tape]; Demerol [meperidine]; Fentanyl; and Statins    Social History:  The patient  reports that he quit smoking about 41 years ago. His smoking use included cigarettes. He started smoking about 48 years ago. He has a 7.00 pack-year smoking history. He has never used smokeless tobacco. He reports that he does not drink alcohol or use drugs.   Family History:  The patient's family history includes Heart failure in his father;  Leukemia in his mother.    ROS:  Please see the history of present illness.   Otherwise, review of systems are positive for none.   All other systems are reviewed and negative.    PHYSICAL EXAM: VS:  BP 119/67   Pulse (!) 55   Ht 5\' 6"  (1.676 m)   Wt 184 lb 3.2 oz (83.6 kg)   BMI 29.73 kg/m  , BMI Body mass index is 29.73 kg/m. GENERAL:  Well appearing WM in NAD HEENT:  PERRL, EOMI, sclera are clear. Oropharynx is clear. NECK:  No jugular venous distention, carotid upstroke brisk and symmetric, no bruits, no thyromegaly or adenopathy LUNGS:  Clear to auscultation bilaterally CHEST:  Unremarkable HEART:  RRR,  PMI not displaced or sustained,S1 and S2 within normal limits, no S3, no S4: no clicks, no rubs, no murmurs ABD:  Soft, nontender. BS +, no masses or bruits. No hepatomegaly, no splenomegaly EXT:  2 + pulses throughout, no edema, no cyanosis no clubbing SKIN:  Warm and dry.  No rashes NEURO:  Alert and oriented x 3. Cranial nerves II through XII intact. PSYCH:  Cognitively intact    Recent Labs: 12/28/2017: ALT 23; BUN 18; Creatinine, Ser 1.01; Hemoglobin 16.0; Platelets 204; Potassium 4.7; Sodium 141; TSH 3.025    Lipid Panel    Component Value Date/Time   CHOL 209 (H) 12/28/2017 1027   CHOL 152 03/22/2013 0813   TRIG 215 (H) 12/28/2017 1027    TRIG 83 03/22/2013 0813   HDL 38 (L) 12/28/2017 1027   HDL 39 (L) 03/22/2013 0813   CHOLHDL 5.5 12/28/2017 1027   VLDL 43 (H) 12/28/2017 1027   LDLCALC 128 (H) 12/28/2017 1027   LDLCALC 96 03/22/2013 0813      Wt Readings from Last 3 Encounters:  07/31/18 184 lb 3.2 oz (83.6 kg)  01/31/18 183 lb (83 kg)  01/11/18 187 lb (84.8 kg)    Ecg today shows sinus brady rate 54. Otherwise normal. I have personally reviewed and interpreted this study.   Other studies Reviewed: Additional studies/ records that were reviewed today include:  04/2017 exercise nuclear stress: low risk Duke score of 10. Small basal inferolateral infarct with mild ischemia. Overall low risk  Cardiac cath 09/21/17:Procedures   CORONARY STENT INTERVENTION  INTRAVASCULAR PRESSURE WIRE/FFR STUDY  LEFT HEART CATH AND CORONARY ANGIOGRAPHY  Conclusion     Prox Cx lesion is 60% stenosed.  Previously placed Ost LAD to Prox LAD stent (unknown type) is widely patent.  Prox LAD lesion is 50% stenosed.  Ost RPDA to RPDA lesion is 60% stenosed.  A stent was successfully placed.  Post intervention, there is a 0% residual stenosis.  The left ventricular systolic function is normal.  LV end diastolic pressure is normal.  The left ventricular ejection fraction is 55-65% by visual estimate.     Repeat cardiac cath 09/21/17: Procedures   Coronary/Graft Acute MI Revascularization  LEFT HEART CATH AND CORONARY ANGIOGRAPHY  Conclusion     Non-stenotic Ost LAD to Prox LAD lesion previously treated.  Prox LAD lesion is 50% stenosed.  Previously placed Prox Cx stent (unknown type) is widely patent.  Ost RPDA to RPDA lesion is 60% stenosed.  Ost 3rd Mrg to 3rd Mrg lesion is 50% stenosed.   1. The stent in the mid LCx is widely patent. There is an intimal dissection in the third OM. Careful review of his prior study demonstrates a wire tip induced dissection in this area. Currently there  is a long segment of  50% narrowing with TIMI 3 flow. The patient is pain free. No other change in his angiography. I would favor continued medical therapy to allow this intimal dissection to heal. I am concerned that attempt at recrossing with a wire could lead to subintimal selection and worsening of the dissection. Will continue IV heparin and Ntg overnight. Continue DAPT. If he should develop evidence of reocclusion then attempt at recrossing and stenting of the dissection would be necessary.     Cardiac cath 01/11/18: Procedures   LEFT HEART CATH AND CORONARY ANGIOGRAPHY  Conclusion     Non-stenotic Ost LAD to Prox LAD lesion previously treated.  Prox LAD lesion is 45% stenosed.  Non-stenotic Prox Cx lesion previously treated.  Ost RPDA to RPDA lesion is 60% stenosed.  The left ventricular systolic function is normal.  LV end diastolic pressure is normal.  The left ventricular ejection fraction is 55-65% by visual estimate.   1. Nonobstructive CAD. The prior stents in the LAD and LCx are widely patent. The prior wire dissection in the OM3 has healed completely 2. Normal LV function 3. Normal LVEDP  Plan: continue medical therapy. Consider alternative causes for his symptoms.      ASSESSMENT AND PLAN:  1.  Unstable angina. Recent onset of worsening exertional angina. Reproducible and similar to prior cardiac pain. CAD s/p remote Anterior STEMI in 2012 treated with DES of LAD. More recent NSTEMI in December 2018. He had DES of LCx with abnormal FFR. Subsequent wire dissection of OM 3 treated medically.  Repeat cardiac cath this April showed nonobstructive disease and healed wire dissection of OM3. Given his recent symptoms I recommend repeat angiography since his symptoms are progressive and worrisome for plaque rupture. I don't a stress test would be very helpful. He is on optimal medical therapy with 2 antianginal drugs. The procedure and risks were reviewed including but not limited to death,  myocardial infarction, stroke, arrythmias, bleeding, transfusion, emergency surgery, dye allergy, or renal dysfunction. The patient voices understanding and is agreeable to proceed..  2. Hypercholesterolemia. Lipids are not ideal. Intolerance to statins. Now enrolled in the [place] 4 trial.  3. HTN.   4. Carotid arterial disease  5. Sinus bradycardia.   Labs will be drawn today.   Signed, Maxximus Gotay [name], MD  07/31/2018 8:57 AM    [facility] [address], [place], [place], [address] Phone [phone], Fax [phone]

## 2018-07-31 ENCOUNTER — Other Ambulatory Visit: Payer: Self-pay | Admitting: Cardiology

## 2018-07-31 ENCOUNTER — Ambulatory Visit (INDEPENDENT_AMBULATORY_CARE_PROVIDER_SITE_OTHER): Payer: PPO | Admitting: Cardiology

## 2018-07-31 ENCOUNTER — Encounter: Payer: Self-pay | Admitting: Cardiology

## 2018-07-31 VITALS — BP 119/67 | HR 55 | Ht 66.0 in | Wt 184.2 lb

## 2018-07-31 DIAGNOSIS — E782 Mixed hyperlipidemia: Secondary | ICD-10-CM | POA: Diagnosis not present

## 2018-07-31 DIAGNOSIS — I200 Unstable angina: Secondary | ICD-10-CM | POA: Diagnosis not present

## 2018-07-31 DIAGNOSIS — I10 Essential (primary) hypertension: Secondary | ICD-10-CM

## 2018-07-31 DIAGNOSIS — Z9861 Coronary angioplasty status: Secondary | ICD-10-CM | POA: Diagnosis not present

## 2018-07-31 DIAGNOSIS — I2510 Atherosclerotic heart disease of native coronary artery without angina pectoris: Secondary | ICD-10-CM

## 2018-07-31 LAB — CBC WITH DIFFERENTIAL/PLATELET
BASOS: 1 %
Basophils Absolute: 0.1 10*3/uL (ref 0.0–0.2)
EOS (ABSOLUTE): 0.2 10*3/uL (ref 0.0–0.4)
EOS: 2 %
HEMATOCRIT: 45.9 % (ref 37.5–51.0)
Hemoglobin: 15.5 g/dL (ref 13.0–17.7)
IMMATURE GRANS (ABS): 0 10*3/uL (ref 0.0–0.1)
Immature Granulocytes: 0 %
LYMPHS: 24 %
Lymphocytes Absolute: 1.7 10*3/uL (ref 0.7–3.1)
MCH: 30.4 pg (ref 26.6–33.0)
MCHC: 33.8 g/dL (ref 31.5–35.7)
MCV: 90 fL (ref 79–97)
Monocytes Absolute: 0.7 10*3/uL (ref 0.1–0.9)
Monocytes: 10 %
Neutrophils Absolute: 4.5 10*3/uL (ref 1.4–7.0)
Neutrophils: 63 %
PLATELETS: 233 10*3/uL (ref 150–450)
RBC: 5.1 x10E6/uL (ref 4.14–5.80)
RDW: 12.6 % (ref 12.3–15.4)
WBC: 7.2 10*3/uL (ref 3.4–10.8)

## 2018-07-31 LAB — BASIC METABOLIC PANEL
BUN/Creatinine Ratio: 16 (ref 10–24)
BUN: 15 mg/dL (ref 8–27)
CALCIUM: 9.3 mg/dL (ref 8.6–10.2)
CO2: 25 mmol/L (ref 20–29)
CREATININE: 0.94 mg/dL (ref 0.76–1.27)
Chloride: 103 mmol/L (ref 96–106)
GFR calc Af Amer: 96 mL/min/{1.73_m2} (ref >59)
GFR, EST NON AFRICAN AMERICAN: 83 mL/min/{1.73_m2} (ref >59)
Glucose: 96 mg/dL (ref 65–99)
Potassium: 4.6 mmol/L (ref 3.5–5.2)
Sodium: 141 mmol/L (ref 134–144)

## 2018-07-31 LAB — PT AND PTT
APTT: 27 s (ref 24–33)
INR: 1 (ref 0.8–1.2)
Prothrombin Time: 10.4 s (ref 9.1–12.0)

## 2018-07-31 MED ORDER — LOSARTAN POTASSIUM 25 MG PO TABS: 25.0000 mg | ORAL_TABLET | Freq: Every day | ORAL | 1 refills | Status: DC

## 2018-07-31 MED ORDER — AMLODIPINE BESYLATE 2.5 MG PO TABS: 2.5000 mg | ORAL_TABLET | Freq: Every day | ORAL | 30 refills | Status: DC

## 2018-07-31 NOTE — Addendum Note (Signed)
Addended by: Kathyrn Lass on: 07/31/2018 09:10 AM   Modules accepted: Orders

## 2018-07-31 NOTE — Addendum Note (Signed)
Addended by: Kathyrn Lass on: 07/31/2018 09:30 AM   Modules accepted: Orders

## 2018-07-31 NOTE — Patient Instructions (Signed)
    [facility] [place] [address] Dept: [phone] Loc: [phone]  HAWK MONES  07/31/2018  You are scheduled for a Cardiac Cath on Friday 08/03/18, with Dr.Jordan.  1. Please arrive at the [facility] (Main Entrance A) at [facility] [address] [place], [place] [address] at 5:30 am (This time is two hours before your procedure to ensure your preparation). Free valet parking service is available.   Special note: Every effort is made to have your procedure done on time. Please understand that emergencies sometimes delay scheduled procedures.  2. Diet: Do not eat solid foods after midnight.  The patient may have clear liquids until 5am upon the day of the procedure.  3. Labs: You will need to have blood drawn today 07/31/18 Bmet,cbc,pt. You do not need to be fasting.  4. Medication instructions in preparation for your procedure:       On the morning of your procedure, take your Plavix and Aspirin and other morning medications You may use sips of water.  5. Plan for one night stay--bring personal belongings. 6. Bring a current list of your medications and current insurance cards. 7. You MUST have a responsible person to drive you home. 8. Someone MUST be with you the first 24 hours after you arrive home or your discharge will be delayed. 9. Please wear clothes that are easy to get on and off and wear slip-on shoes.  Thank you for allowing [place] to care for you!   -- [facility]

## 2018-08-02 ENCOUNTER — Telehealth: Payer: Self-pay | Admitting: *Deleted

## 2018-08-02 NOTE — Progress Notes (Signed)
Spoke to patient results given.

## 2018-08-02 NOTE — Telephone Encounter (Signed)
Pt contacted pre-catheterization scheduled at [facility] for: Friday August 03, 2018 7:30 AM Verified arrival time and place: [place] Entrance A at: 5:30 AM  No solid food after midnight prior to cath, clear liquids until 5 AM day of procedure. Contrast allergy: no Verified no diabetes medications.  AM meds can be  taken pre-cath with sip of water including: ASA 81 mg Clopidogrel 75 mg  Confirmed patient has responsible person to drive home post procedure and for 24 hours after you arrive home: yes

## 2018-08-03 ENCOUNTER — Encounter (HOSPITAL_COMMUNITY)
Admission: RE | Disposition: A | Discharge status: Home / Self Care | Payer: Self-pay | Source: Ambulatory Visit | Attending: Cardiology

## 2018-08-03 ENCOUNTER — Encounter (HOSPITAL_COMMUNITY): Payer: Self-pay | Admitting: Cardiology

## 2018-08-03 ENCOUNTER — Other Ambulatory Visit: Payer: Self-pay

## 2018-08-03 ENCOUNTER — Ambulatory Visit (HOSPITAL_COMMUNITY)
Admission: RE | Admit: 2018-08-03 | Discharge: 2018-08-03 | Disposition: A | Discharge status: Home / Self Care | Payer: PPO | Source: Ambulatory Visit | Attending: Cardiology | Admitting: Cardiology

## 2018-08-03 DIAGNOSIS — I493 Ventricular premature depolarization: Secondary | ICD-10-CM | POA: Diagnosis not present

## 2018-08-03 DIAGNOSIS — Z8249 Family history of ischemic heart disease and other diseases of the circulatory system: Secondary | ICD-10-CM | POA: Insufficient documentation

## 2018-08-03 DIAGNOSIS — Z7982 Long term (current) use of aspirin: Secondary | ICD-10-CM | POA: Insufficient documentation

## 2018-08-03 DIAGNOSIS — Z86718 Personal history of other venous thrombosis and embolism: Secondary | ICD-10-CM | POA: Insufficient documentation

## 2018-08-03 DIAGNOSIS — Z885 Allergy status to narcotic agent status: Secondary | ICD-10-CM | POA: Diagnosis not present

## 2018-08-03 DIAGNOSIS — I739 Peripheral vascular disease, unspecified: Secondary | ICD-10-CM | POA: Insufficient documentation

## 2018-08-03 DIAGNOSIS — E782 Mixed hyperlipidemia: Secondary | ICD-10-CM | POA: Diagnosis not present

## 2018-08-03 DIAGNOSIS — I252 Old myocardial infarction: Secondary | ICD-10-CM | POA: Diagnosis not present

## 2018-08-03 DIAGNOSIS — Z7902 Long term (current) use of antithrombotics/antiplatelets: Secondary | ICD-10-CM | POA: Diagnosis not present

## 2018-08-03 DIAGNOSIS — Z87891 Personal history of nicotine dependence: Secondary | ICD-10-CM | POA: Diagnosis not present

## 2018-08-03 DIAGNOSIS — I25110 Atherosclerotic heart disease of native coronary artery with unstable angina pectoris: Secondary | ICD-10-CM | POA: Insufficient documentation

## 2018-08-03 DIAGNOSIS — J45909 Unspecified asthma, uncomplicated: Secondary | ICD-10-CM | POA: Diagnosis not present

## 2018-08-03 DIAGNOSIS — I10 Essential (primary) hypertension: Secondary | ICD-10-CM | POA: Diagnosis not present

## 2018-08-03 DIAGNOSIS — Z955 Presence of coronary angioplasty implant and graft: Secondary | ICD-10-CM

## 2018-08-03 DIAGNOSIS — Z9861 Coronary angioplasty status: Secondary | ICD-10-CM

## 2018-08-03 DIAGNOSIS — I2510 Atherosclerotic heart disease of native coronary artery without angina pectoris: Secondary | ICD-10-CM

## 2018-08-03 DIAGNOSIS — I200 Unstable angina: Secondary | ICD-10-CM

## 2018-08-03 HISTORY — PX: LEFT HEART CATH AND CORONARY ANGIOGRAPHY: CATH118249

## 2018-08-03 HISTORY — DX: Unstable angina: I20.0

## 2018-08-03 HISTORY — PX: CORONARY STENT INTERVENTION: CATH118234

## 2018-08-03 LAB — POCT ACTIVATED CLOTTING TIME: ACTIVATED CLOTTING TIME: 373 s

## 2018-08-03 SURGERY — LEFT HEART CATH AND CORONARY ANGIOGRAPHY
Anesthesia: LOCAL

## 2018-08-03 MED ORDER — SODIUM CHLORIDE 0.9 % WEIGHT BASED INFUSION: 1.0000 mL/kg/h | INTRAVENOUS | Status: AC

## 2018-08-03 MED ORDER — NITROGLYCERIN 0.4 MG SL SUBL
SUBLINGUAL_TABLET | SUBLINGUAL | Status: AC
  Filled 2018-08-03: qty 1

## 2018-08-03 MED ORDER — HYDRALAZINE HCL 20 MG/ML IJ SOLN: 5.0000 mg | INTRAMUSCULAR | Status: AC | PRN

## 2018-08-03 MED ORDER — SODIUM CHLORIDE 0.9 % WEIGHT BASED INFUSION
3.0000 mL/kg/h | INTRAVENOUS | Status: AC
  Administered 2018-08-03: 3 mL/kg/h via INTRAVENOUS

## 2018-08-03 MED ORDER — ONDANSETRON HCL 4 MG/2ML IJ SOLN: 4.0000 mg | Freq: Four times a day (QID) | INTRAMUSCULAR | Status: DC | PRN

## 2018-08-03 MED ORDER — SODIUM CHLORIDE 0.9 % IV SOLN: 250.0000 mL | INTRAVENOUS | Status: DC | PRN

## 2018-08-03 MED ORDER — LABETALOL HCL 5 MG/ML IV SOLN: 10.0000 mg | INTRAVENOUS | Status: AC | PRN

## 2018-08-03 MED ORDER — NITROGLYCERIN 1 MG/10 ML FOR IR/CATH LAB
INTRA_ARTERIAL | Status: AC
  Filled 2018-08-03: qty 10

## 2018-08-03 MED ORDER — HEPARIN (PORCINE) IN NACL 1000-0.9 UT/500ML-% IV SOLN
INTRAVENOUS | Status: AC
  Filled 2018-08-03: qty 500

## 2018-08-03 MED ORDER — MIDAZOLAM HCL 2 MG/2ML IJ SOLN
INTRAMUSCULAR | Status: AC
  Filled 2018-08-03: qty 2

## 2018-08-03 MED ORDER — NITROGLYCERIN 1 MG/10 ML FOR IR/CATH LAB
INTRA_ARTERIAL | Status: DC | PRN
  Administered 2018-08-03 (×2): 200 ug via INTRACORONARY

## 2018-08-03 MED ORDER — IOHEXOL 350 MG/ML SOLN
INTRAVENOUS | Status: DC | PRN
  Administered 2018-08-03: 145 mL via INTRA_ARTERIAL

## 2018-08-03 MED ORDER — ACETAMINOPHEN 325 MG PO TABS: 650.0000 mg | ORAL_TABLET | ORAL | Status: DC | PRN

## 2018-08-03 MED ORDER — SODIUM CHLORIDE 0.9% FLUSH: 3.0000 mL | INTRAVENOUS | Status: DC | PRN

## 2018-08-03 MED ORDER — BIVALIRUDIN TRIFLUOROACETATE 250 MG IV SOLR
INTRAVENOUS | Status: AC
  Filled 2018-08-03: qty 250

## 2018-08-03 MED ORDER — SODIUM CHLORIDE 0.9 % IV SOLN
INTRAVENOUS | Status: DC | PRN
  Administered 2018-08-03: 1.75 mg/kg/h via INTRAVENOUS

## 2018-08-03 MED ORDER — SODIUM CHLORIDE 0.9 % WEIGHT BASED INFUSION: 1.0000 mL/kg/h | INTRAVENOUS | Status: DC

## 2018-08-03 MED ORDER — NITROGLYCERIN 0.4 MG SL SUBL
0.4000 mg | SUBLINGUAL_TABLET | SUBLINGUAL | Status: DC | PRN
  Administered 2018-08-03: 0.4 mg via SUBLINGUAL

## 2018-08-03 MED ORDER — LIDOCAINE HCL (PF) 1 % IJ SOLN
INTRAMUSCULAR | Status: DC | PRN
  Administered 2018-08-03: 20 mL

## 2018-08-03 MED ORDER — BIVALIRUDIN BOLUS VIA INFUSION - CUPID
INTRAVENOUS | Status: DC | PRN
  Administered 2018-08-03: 62.925 mg via INTRAVENOUS

## 2018-08-03 MED ORDER — SODIUM CHLORIDE 0.9% FLUSH: 3.0000 mL | Freq: Two times a day (BID) | INTRAVENOUS | Status: DC

## 2018-08-03 MED ORDER — HEPARIN (PORCINE) IN NACL 1000-0.9 UT/500ML-% IV SOLN
INTRAVENOUS | Status: DC | PRN
  Administered 2018-08-03 (×2): 500 mL

## 2018-08-03 MED ORDER — ASPIRIN 81 MG PO CHEW: 81.0000 mg | CHEWABLE_TABLET | ORAL | Status: AC

## 2018-08-03 MED ORDER — MIDAZOLAM HCL 2 MG/2ML IJ SOLN
INTRAMUSCULAR | Status: DC | PRN
  Administered 2018-08-03: 1 mg via INTRAVENOUS

## 2018-08-03 MED ORDER — LIDOCAINE HCL (PF) 1 % IJ SOLN
INTRAMUSCULAR | Status: AC
  Filled 2018-08-03: qty 30

## 2018-08-03 SURGICAL SUPPLY — 20 items
BALLN SAPPHIRE 2.0X12 (BALLOONS) ×2
BALLN SAPPHIRE ~~LOC~~ 2.25X10 (BALLOONS) ×1 IMPLANT
BALLN SAPPHIRE ~~LOC~~ 3.0X10 (BALLOONS) ×1 IMPLANT
BALLOON SAPPHIRE 2.0X12 (BALLOONS) IMPLANT
CATH INFINITI 5FR MULTPACK ANG (CATHETERS) ×1 IMPLANT
CATH LAUNCHER 6FR EBU 4 (CATHETERS) ×1 IMPLANT
CATHETER LAUNCHER 6FR JR4 SH (CATHETERS) ×1 IMPLANT
KIT ENCORE 26 ADVANTAGE (KITS) ×1 IMPLANT
KIT HEART LEFT (KITS) ×2 IMPLANT
PACK CARDIAC CATHETERIZATION (CUSTOM PROCEDURE TRAY) ×2 IMPLANT
SHEATH PINNACLE 5F 10CM (SHEATH) ×1 IMPLANT
SHEATH PINNACLE 6F 10CM (SHEATH) ×1 IMPLANT
SHEATH PROBE COVER 6X72 (BAG) ×1 IMPLANT
STENT SYNERGY DES 2.25X12 (Permanent Stent) ×1 IMPLANT
STENT SYNERGY DES 3X12 (Permanent Stent) ×1 IMPLANT
TRANSDUCER W/MONITORING KIT (MISCELLANEOUS) ×1 IMPLANT
TRANSDUCER W/STOPCOCK (MISCELLANEOUS) ×2 IMPLANT
TUBING CIL FLEX 10 FLL-RA (TUBING) ×2 IMPLANT
WIRE ASAHI PROWATER 180CM (WIRE) ×1 IMPLANT
WIRE EMERALD 3MM-J .035X150CM (WIRE) ×1 IMPLANT

## 2018-08-03 NOTE — Progress Notes (Signed)
C/o mid chest pressure rates as a 3 on pain scale. Nitro given SL. (per [employer]) After [employer] given states "No chest Pain"

## 2018-08-03 NOTE — Progress Notes (Signed)
Pt ambulated in hall and went to bathroom. No new bleeding noted. Pt tolerated well. Vin, PA in to see pt. Okay for pt to DC home.

## 2018-08-03 NOTE — Progress Notes (Signed)
Cardiac Rehab in to see pt.

## 2018-08-03 NOTE — Interval H&P Note (Signed)
History and Physical Interval Note:  08/03/2018 7:15 AM  [name]  has presented today for surgery, with the diagnosis of cp  The various methods of treatment have been discussed with the patient and family. After consideration of risks, benefits and other options for treatment, the patient has consented to  Procedure(s): LEFT HEART CATH AND CORONARY ANGIOGRAPHY (N/A) as a surgical intervention .  The patient's history has been reviewed, patient examined, no change in status, stable for surgery.  I have reviewed the patient's chart and labs.  Questions were answered to the patient's satisfaction.   Cath Lab Visit (complete for each Cath Lab visit)  Clinical Evaluation Leading to the Procedure:   ACS: Yes.    Non-ACS:    Anginal Classification: CCS III  Anti-ischemic medical therapy: Maximal Therapy (2 or more classes of medications)  Non-Invasive Test Results: No non-invasive testing performed  Prior CABG: No previous CABG        Peter [name] MD,FACC 08/03/2018 7:15 AM

## 2018-08-03 NOTE — Discharge Summary (Signed)
Discharge Summary    Patient ID: [name] MRN: [id number]; DOB: May 25, 1951  Admit date: 08/03/2018 Discharge date: 08/03/2018  Primary Care Provider: [name], MD  Primary Cardiologist: Kyle [name], MD   Discharge Diagnoses    Principal Problem:   Unstable angina [facility]) Active Problems:   CAD S/P percutaneous coronary angioplasty   HTN (hypertension)   Mixed hyperlipidemia  Allergies Allergies  Allergen Reactions  . Adhesive [Tape] Hives, Itching and Other (See Comments)    Hives  And itching from the adhesive on the pads of the heart monitor   . Demerol [Meperidine] Nausea Only and Other (See Comments)    Drop in blood pressure, Muscle spasm  . Fentanyl Nausea Only and Other (See Comments)    Drop in blood pressure, Muscle spasm  . Statins Other (See Comments)    Myalgias     Diagnostic Studies/Procedures    CORONARY STENT INTERVENTION  08/03/18  LEFT HEART CATH AND CORONARY ANGIOGRAPHY  Conclusion     Prox LAD lesion is 45% stenosed.  Ost RPDA to RPDA lesion is 80% stenosed.  A drug-eluting stent was successfully placed using a STENT SYNERGY DES 2.25X12.  Post intervention, there is a 0% residual stenosis.  Ost LAD to Prox LAD lesion is 35% stenosed.  Prox Cx-1 lesion is 95% stenosed.  Post intervention, there is a 0% residual stenosis.  A drug-eluting stent was successfully placed using a STENT SYNERGY DES 3X12.  Non-stenotic Prox Cx-2 lesion was previously treated.  Post Atrio lesion is 30% stenosed.  LV end diastolic pressure is normal.   1. 2 vessel obstructive CAD    - patent stent in the proximal LAD. Diffuse 45-50% mid LAD    - 95% stenosis in the LCx at the proximal stent margin    - 80% PDA 2. Normal LVEDP 3. Successful PCI of the LCx with DES x 1 overlapping the prior stent 4. Successful PCI of the PDA with DES x 1.   Plan: Anticipate same day discharge.      History of Present Illness     [name] is a 67 y.o.  with hx of CAD (anterior STEMI 04/2011, s/p DES to LAD),  HTN, sinus bradycardia, HLD (intolerant of statins), frequent PVCs by prior 48-hour monitor, priorprovokedDVT (treated with Xarelto x 5 months, stopped due to hematuria), mild carotid artery disease, prostate CA presents for scheduled cath.   CAD s/p remote Anterior STEMI in 2012 treated with DES of LAD. More recent NSTEMI in December 2018. He had DES of LCx with abnormal FFR. Subsequent wire dissection of OM 3 treated medically. Repeat cardiac cath this April showed nonobstructive disease and healed wire dissection of OM3. LV function and LVEDP were normal.   Intolerance to statin. He enrolled in the [name] 4 trial.   Recent onset of worsening exertional angina. Reproducible and similar to prior cardiac pain. Recommend repeat angiography since his symptoms are progressive and worrisome for plaque rupture.   Hospital Course     Consultants: None  Cath showed 95% stenosis in the LCx at the proximal stent margin s/p successful PCI  with DES x 1 overlapping the prior stent. 80% PDA s/p DES x 1. Patent pLAD stent. Diffuse 45-50%mLAD. normla LVEDP. Continue ASA and Plavix. No change in home medications. Felt stable for same day PCI. No complications. R groin without hematoma. Ambulated without any pain.   Discharge Vitals Blood pressure (!) 97/51, pulse 62, temperature 98 F (36.7 C), temperature source Oral,  resp. rate 14, height 5\' 6"  (1.676 m), weight 83.9 kg, SpO2 95 %.  Filed Weights   08/03/18 0536  Weight: 83.9 kg    Physical Exam  Constitutional: He is oriented to person, place, and time and well-developed, well-nourished, and in no distress.  HENT:  Head: Normocephalic and atraumatic.  Eyes: Pupils are equal, round, and reactive to light. EOM are normal.  Neck: Normal range of motion. Neck supple.  Cardiovascular: Normal rate and regular rhythm.  R groin without hematoma    Pulmonary/Chest: Effort normal and breath sounds  normal.  Abdominal: Soft. Bowel sounds are normal.  Musculoskeletal: Normal range of motion.  Neurological: He is alert and oriented to person, place, and time.  Skin: Skin is warm and dry.  Psychiatric: Affect normal.     Labs & Radiologic Studies    CBC No results for input(s): WBC, NEUTROABS, HGB, HCT, MCV, PLT in the last 72 hours. Basic Metabolic Panel No results for input(s): NA, K, CL, CO2, GLUCOSE, BUN, CREATININE, CALCIUM, MG, PHOS in the last 72 hours.  Disposition   Pt is being discharged home today in good condition.  Follow-up Plans & Appointments    Follow-up Information    Sheppard, Kyle M, MD. Go on 08/22/2018.   Specialty:  Cardiology Why:  @11 :40am for cath follow up Contact information: [place] [address] [phone]          Discharge Instructions    Amb Referral to Cardiac Rehabilitation   Complete by:  As directed    Diagnosis:  Coronary Stents   Diet - low sodium heart healthy   Complete by:  As directed    Discharge instructions   Complete by:  As directed    No driving for 48 hours. No lifting over 5 lbs for 1 week. No sexual activity for 1 week.  Keep procedure site clean & dry. If you notice increased pain, swelling, bleeding or pus, call/return!  You may shower, but no soaking baths/hot tubs/pools for 1 week.   Increase activity slowly   Complete by:  As directed      Discharge Medications   Allergies as of 08/03/2018      Reactions   Adhesive [tape] Hives, Itching, Other (See Comments)   Hives  And itching from the adhesive on the pads of the heart monitor    Demerol [meperidine] Nausea Only, Other (See Comments)   Drop in blood pressure, Muscle spasm   Fentanyl Nausea Only, Other (See Comments)   Drop in blood pressure, Muscle spasm   Statins Other (See Comments)   Myalgias      Medication List    TAKE these medications   acetaminophen 500 MG tablet Commonly known as:  TYLENOL Take 1,000 mg  by mouth daily as needed for moderate pain or headache.   amLODipine 2.5 MG tablet Commonly known as:  NORVASC Take 1 tablet (2.5 mg total) by mouth daily.   aspirin 81 MG tablet Take 81 mg by mouth daily.   clopidogrel 75 MG tablet Commonly known as:  PLAVIX TAKE 1 TABLET BY MOUTH ONCE A DAY.   Co Q-10 100 MG Caps Take 100 mg by mouth daily.   losartan 25 MG tablet Commonly known as:  COZAAR Take 1 tablet (25 mg total) by mouth daily.   metoprolol tartrate 25 MG tablet Commonly known as:  LOPRESSOR Take 50 mg ( 2 Tablets) in the AM and 25 mg ( 1 Tablet) in the  PM What changed:    how much to take  how to take this  when to take this  additional instructions   MULTIVITAMIN PO Take 1 tablet by mouth daily.   nitroGLYCERIN 0.4 MG SL tablet Commonly known as:  NITROSTAT PLACE 1 UNDER TONGUE EVERY 5 MINUTES UP TO 3 DOSES AS NEEDED FOR CHEST PAIN. What changed:  See the new instructions.        Acute coronary syndrome (MI, NSTEMI, STEMI, etc) this admission?: No.    Outstanding Labs/Studies   None  Duration of Discharge Encounter   Greater than 30 minutes including physician time.  Kyle Soho, PA 08/03/2018, 3:40 PM

## 2018-08-03 NOTE — Discharge Instructions (Signed)
Femoral Site Care Refer to this sheet in the next few weeks. These instructions provide you with information about caring for yourself after your procedure. Your health care provider may also give you more specific instructions. Your treatment has been planned according to current medical practices, but problems sometimes occur. Call your health care provider if you have any problems or questions after your procedure. What can I expect after the procedure? After your procedure, it is typical to have the following:  Bruising at the site that usually fades within 1-2 weeks.  Blood collecting in the tissue (hematoma) that may be painful to the touch. It should usually decrease in size and tenderness within 1-2 weeks.  Follow these instructions at home:  Take medicines only as directed by your health care provider.  You may shower 24-48 hours after the procedure or as directed by your health care provider. Remove the bandage (dressing) and gently wash the site with plain soap and water. Pat the area dry with a clean towel. Do not rub the site, because this may cause bleeding.  Do not take baths, swim, or use a hot tub until your health care provider approves.  Check your insertion site every day for redness, swelling, or drainage.  Do not apply powder or lotion to the site.  Limit use of stairs to twice a day for the first 2-3 days or as directed by your health care provider.  Do not squat for the first 2-3 days or as directed by your health care provider.  Do not lift over 10 lb (4.5 kg) for 5 days after your procedure or as directed by your health care provider.  Ask your health care provider when it is okay to: ? Return to work or school. ? Resume usual physical activities or sports. ? Resume sexual activity.  Do not drive home if you are discharged the same day as the procedure. Have someone else drive you.  You may drive 24 hours after the procedure unless otherwise instructed by  your health care provider.  Do not operate machinery or power tools for 24 hours after the procedure or as directed by your health care provider.  If your procedure was done as an outpatient procedure, which means that you went home the same day as your procedure, a responsible adult should be with you for the first 24 hours after you arrive home.  Keep all follow-up visits as directed by your health care provider. This is important. Contact a health care provider if:  You have a fever.  You have chills.  You have increased bleeding from the site. Hold pressure on the site. Get help right away if:  You have unusual pain at the site.  You have redness, warmth, or swelling at the site.  You have drainage (other than a small amount of blood on the dressing) from the site.  The site is bleeding, and the bleeding does not stop after 30 minutes of holding steady pressure on the site.  Your leg or foot becomes pale, cool, tingly, or numb. This information is not intended to replace advice given to you by your health care provider. Make sure you discuss any questions you have with your health care provider. Document Released: 05/16/2014 Document Revised: 02/18/2016 Document Reviewed: 04/01/2014 Elsevier Interactive Patient Education  2018 [place] - - - - - - - - - - - - - - - - - - - - - - - - - - - - - - - - - - - - - - - - - - -  Information about your medication: Plavix (anti-platelet agent)  Generic Name (Brand): clopidogrel (Plavix), once daily medication  PURPOSE: You are taking this medication along with aspirin to lower your chance of having a heart attack, stroke, or blood clots in your heart stent. These can be fatal. Brilinta and aspirin help prevent platelets from sticking together and forming a clot that can block an artery or your stent.   Common SIDE EFFECTS you may experience  include: bruising or bleeding more easily, shortness of breath  Do not stop taking PLAVIX without talking to the doctor who prescribes it for you. People who are treated with a stent and stop taking Plavix too soon, have a higher risk of getting a blood clot in the stent, having a heart attack, or dying. If you stop Plavix because of bleeding, or for other reasons, your risk of a heart attack or stroke may increase.   Tell all of your doctors and dentists that you are taking Plavix. They should talk to the doctor who prescribed Brilinta for you before you have any surgery or invasive procedure.   Contact your health care provider if you experience: severe or uncontrollable bleeding, pink/red/brown urine, vomiting blood or vomit that looks like "coffee grounds", red or black stools (looks like tar), coughing up blood or blood clots ----------------------------------------------------------------------------------------------------------------------

## 2018-08-03 NOTE — Progress Notes (Signed)
[id number] Pt seen by me in December. Brief review of ed done. Pt has been watching his diet very closely with wife's help and he exercises three hours a day about 5 days a week. Pt encouraged to restart ex slowly when groin is healed from procedure. Reviewed temp precautions. Will refer to [facility] 2 but pt not interested in attending as he has an ex routine he prefers. Congratulated pt on improving diet and increasing his activity. Gave heart healthy diet and reviewed NTG use, and purpose of plavix with stents. Graylon Good RN BSN 08/03/2018 1:54 PM

## 2018-08-03 NOTE — Progress Notes (Signed)
Site area: Right groin a 6 french arterial sheath was removed  Site Prior to Removal:  Level 0  Pressure Applied For 20 MINUTES    Bedrest Beginning at 1115am  Manual:   Yes.    Patient Status During Pull:  stable  Post Pull Groin Site:  Level 0  Post Pull Instructions Given:  Yes.    Post Pull Pulses Present:  Yes.    Dressing Applied:  Yes.    Comments:  VS remain stable

## 2018-08-06 ENCOUNTER — Encounter (HOSPITAL_COMMUNITY): Payer: Self-pay | Admitting: Cardiology

## 2018-08-17 NOTE — Progress Notes (Signed)
Cardiology Office Note   Date:  08/22/2018   ID:  Kyle, Sheppard [birth year], MRN [id number]  PCP:  Kyle Evens, MD  Cardiologist:  Kyle Goodrich [name], MD   Chief Complaint  Patient presents with  . Coronary Artery Disease  . Chest Pain      History of Present Illness: [name] is a 67 y.o. male who presents for follow up CAD. 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 Later had severe pain in his chest, back, and jaw. He was transferred to [facility] for further evaluation. Cath done 12/27 by Dr [name] revealed a patent LAD stent with a mLAD 50%, 60% mCFX, and 60% PDA- diffuse. The PDA disease was unchanged from 2012.  The CFX lesion was significant by FFR and he underwent PCI with DES. Later that night he had recurrent chest pain and was taken back to the lab. Cath revealed patent CFX stent with dissection in the OM3 with TIM 3 flow felt to be related to wire dissection.  Plan was for medical Rx. I felt  that attempt at recrossing with a wire could lead to subintimal selection and worsening of the dissection. He was managed medically.  Due to persistent symptoms he underwent repeat cardiac cath on 01/11/18. This showed both prior stents were widely patent. The wire dissection had healed completely. LV function and LVEDP were normal.   He recently enrolled in the [place] 4 trial.   In September he reports he had swelling and redness of his left leg associated with fever. Treated with antibiotics with resolution.  He was seen on November 5 with a 3 weeks history of increased exertional  angina. Prior to this he was exercising 3 hours/day 5 days a week. Walking 5 miles with only minimal pain. He reported he would start with an elliptical machine and will get chest pain within 10 minutes and has to stop. Pain was acute and radiates to his jaw and down both arms. He stopped exercising for one week but when he tried to go back to it his symptoms returned. On one occasion he felt like he was having a heart attack. Also notes SOB with exertion such as clearing brush. Symptoms are relieved with rest. Because of these symptoms he underwent repeat cardiac cath on 08/03/18. This showed new severe stenosis in the LCx at the proximal stent margin. There was also progression of disease in the PDA. Both these sites were successfully stented with DES.   Since this last procedure he has felt wonderful. The heaviness in his chest has completely resolved. No complications at his cath site.   Past Medical History:  Diagnosis Date  . Abnormality of aortic valve    a. possibly bicuspid.  . Asthma    Childhood  . Carotid artery disease ([place])    a. 1-39% bilaterally in 2017.  Marland Kitchen Coronary artery disease    a. anterior STEMI 04/2011, s/p DES to LAD.  Marland Kitchen DVT (deep venous thrombosis) (HCC)    a. tx with Xarelto x 5 months, stopped due to hematuria.  . Frequent PVCs  a. seen on Holter monitor.  . Hyperlipidemia, mixed   . Hypertension   . Myocardial infarction ([place]) 05/21/2011   anterior wall, treated by Dr. Marylyn Sheppard with 3.0x41mm Promus drug eluting stent to proximal LAD   . Peripheral arterial disease (H[facility])    moderate left ICA disease, mild right ICA disease  . Prostate cancer ([place]) 2007  . Sinus bradycardia    a. HR 50s at home.  . Statin intolerance   . Unstable angina ([place]) 08/03/2018    Past Surgical History:  Procedure Laterality Date  . CARDIAC CATHETERIZATION  05/23/2011, 05/21/2011   05/21/2011   3.0 x 23mm Promus Element drug eluting stent positioned in proximal LAD, stenosis  taken from 99% to 0%.  . CORONARY STENT INTERVENTION N/A 09/21/2017   Procedure: CORONARY STENT INTERVENTION;  Surgeon: [name], MD;  Location: [place] [facility];  Service: Cardiovascular;  Laterality: N/A;  . CORONARY STENT INTERVENTION N/A 08/03/2018   Procedure: CORONARY STENT INTERVENTION;  Surgeon: Sheppard, [name] M, MD;  Location: [place] [facility];  Service: Cardiovascular;  Laterality: N/A;  . CORONARY/GRAFT ACUTE MI REVASCULARIZATION N/A 09/21/2017   Procedure: Coronary/Graft Acute MI Revascularization;  Surgeon: Sheppard, Kyle Erven M, MD;  Location: [place] [facility];  Service: Cardiovascular;  Laterality: N/A;  . HERNIA REPAIR    . INTRAVASCULAR PRESSURE WIRE/FFR STUDY N/A 09/21/2017   Procedure: INTRAVASCULAR PRESSURE WIRE/FFR STUDY;  Surgeon: [name], MD;  Location: [place] [facility];  Service: Cardiovascular;  Laterality: N/A;  . LEFT HEART CATH AND CORONARY ANGIOGRAPHY N/A 09/21/2017   Procedure: LEFT HEART CATH AND CORONARY ANGIOGRAPHY;  Surgeon: Sheppard, Kyle Torry M, MD;  Location: [place] [facility];  Service: Cardiovascular;  Laterality: N/A;  . LEFT HEART CATH AND CORONARY ANGIOGRAPHY N/A 09/21/2017   Procedure: LEFT HEART CATH AND CORONARY ANGIOGRAPHY;  Surgeon: [name], MD;  Location: [place] [facility];  Service: Cardiovascular;  Laterality: N/A;  . LEFT HEART CATH AND CORONARY ANGIOGRAPHY N/A 01/11/2018   Procedure: LEFT HEART CATH AND CORONARY ANGIOGRAPHY;  Surgeon: Sheppard, Kyle Hochberg M, MD;  Location: [place] [facility];  Service: Cardiovascular;  Laterality: N/A;  . LEFT HEART CATH AND CORONARY ANGIOGRAPHY N/A 08/03/2018   Procedure: LEFT HEART CATH AND CORONARY ANGIOGRAPHY;  Surgeon: Sheppard, Kyle Moger M, MD;  Location: [place] [facility];  Service: Cardiovascular;  Laterality: N/A;  . PROSTATECTOMY  2007     Current Outpatient Medications  Medication Sig Dispense Refill  . acetaminophen (TYLENOL) 500 MG tablet Take 1,000 mg by mouth daily as needed for  moderate pain or headache.    Marland Kitchen amLODipine (NORVASC) 2.5 MG tablet Take 1 tablet (2.5 mg total) by mouth daily. 90 tablet 30  . aspirin 81 MG tablet Take 81 mg by mouth daily.     . clopidogrel (PLAVIX) 75 MG tablet TAKE 1 TABLET BY MOUTH ONCE A DAY. 90 tablet 0  . Coenzyme Q10 (CO Q-10) 100 MG CAPS Take 100 mg by mouth daily.     Marland Kitchen losartan (COZAAR) 25 MG tablet Take 1 tablet (25 mg total) by mouth daily. 90 tablet 1  . metoprolol tartrate (LOPRESSOR) 25 MG tablet Take 50 mg ( 2 Tablets) in the AM and 25 mg ( 1 Tablet) in the PM (Patient taking differently: Take 25 mg by mouth 2 (two) times daily. ) 270 tablet 3  . Multiple Vitamins-Minerals (MULTIVITAMIN PO) Take 1 tablet by mouth daily.    . nitroGLYCERIN (NITROSTAT) 0.4 MG SL  tablet PLACE 1 UNDER TONGUE EVERY 5 MINUTES UP TO 3 DOSES AS NEEDED FOR CHEST PAIN. (Patient taking differently: Place 0.4 mg under the tongue every 5 (five) minutes as needed for chest pain. ) 25 tablet 3   No current facility-administered medications for this visit.     Allergies:   Adhesive [tape]; Demerol [meperidine]; Fentanyl; and Statins    Social History:  The patient  reports that he quit smoking about 41 years ago. His smoking use included cigarettes. He started smoking about 48 years ago. He has a 7.00 pack-year smoking history. He has never used smokeless tobacco. He reports that he does not drink alcohol or use drugs.   Family History:  The patient's family history includes Heart failure in his father; Leukemia in his mother.    ROS:  Please see the history of present illness.   Otherwise, review of systems are positive for none.   All other systems are reviewed and negative.    PHYSICAL EXAM: VS:  BP (!) 156/88   Pulse 68   Ht 5\' 6"  (1.676 Sheppard)   Wt 183 lb (83 kg)   SpO2 95%   BMI 29.54 kg/Sheppard  , BMI Body mass index is 29.54 kg/Sheppard. GENERAL:  Well appearing WM in NAD HEENT:  PERRL, EOMI, sclera are clear. Oropharynx is clear. NECK:  No jugular  venous distention, carotid upstroke brisk and symmetric, no bruits, no thyromegaly or adenopathy LUNGS:  Clear to auscultation bilaterally CHEST:  Unremarkable HEART:  RRR,  PMI not displaced or sustained,S1 and S2 within normal limits, no S3, no S4: no clicks, no rubs, no murmurs ABD:  Soft, nontender. BS +, no masses or bruits. No hepatomegaly, no splenomegaly EXT:  2 + pulses throughout, no edema, no cyanosis no clubbing. Cath site OK. SKIN:  Warm and dry.  No rashes NEURO:  Alert and oriented x 3. Cranial nerves II through XII intact. PSYCH:  Cognitively intact    Recent Labs: 12/28/2017: ALT 23; TSH 3.025 07/31/2018: BUN 15; Creatinine, Ser 0.94; Hemoglobin 15.5; Platelets 233; Potassium 4.6; Sodium 141    Lipid Panel    Component Value Date/Time   CHOL 209 (H) 12/28/2017 1027   CHOL 152 03/22/2013 0813   TRIG 215 (H) 12/28/2017 1027   TRIG 83 03/22/2013 0813   HDL 38 (L) 12/28/2017 1027   HDL 39 (L) 03/22/2013 0813   CHOLHDL 5.5 12/28/2017 1027   VLDL 43 (H) 12/28/2017 1027   LDLCALC 128 (H) 12/28/2017 1027   LDLCALC 96 03/22/2013 0813      Wt Readings from Last 3 Encounters:  08/22/18 183 lb (83 kg)  08/03/18 185 lb (83.9 kg)  07/31/18 184 lb 3.2 oz (83.6 kg)    Ecg today shows sinus brady rate 54. Otherwise normal. I have personally reviewed and interpreted this study.   Other studies Reviewed: Additional studies/ records that were reviewed today include:  04/2017 exercise nuclear stress: low risk Duke score of 10. Small basal inferolateral infarct with mild ischemia. Overall low risk  Cardiac cath 09/21/17:Procedures   CORONARY STENT INTERVENTION  INTRAVASCULAR PRESSURE WIRE/FFR STUDY  LEFT HEART CATH AND CORONARY ANGIOGRAPHY  Conclusion     Prox Cx lesion is 60% stenosed.  Previously placed Ost LAD to Prox LAD stent (unknown type) is widely patent.  Prox LAD lesion is 50% stenosed.  Ost RPDA to RPDA lesion is 60% stenosed.  A stent was  successfully placed.  Post intervention, there is a 0% residual stenosis.  The left ventricular systolic function is normal.  LV end diastolic pressure is normal.  The left ventricular ejection fraction is 55-65% by visual estimate.     Repeat cardiac cath 09/21/17: Procedures   Coronary/Graft Acute MI Revascularization  LEFT HEART CATH AND CORONARY ANGIOGRAPHY  Conclusion     Non-stenotic Ost LAD to Prox LAD lesion previously treated.  Prox LAD lesion is 50% stenosed.  Previously placed Prox Cx stent (unknown type) is widely patent.  Ost RPDA to RPDA lesion is 60% stenosed.  Ost 3rd Mrg to 3rd Mrg lesion is 50% stenosed.   1. The stent in the mid LCx is widely patent. There is an intimal dissection in the third OM. Careful review of his prior study demonstrates a wire tip induced dissection in this area. Currently there is a long segment of 50% narrowing with TIMI 3 flow. The patient is pain free. No other change in his angiography. I would favor continued medical therapy to allow this intimal dissection to heal. I am concerned that attempt at recrossing with a wire could lead to subintimal selection and worsening of the dissection. Will continue IV heparin and Ntg overnight. Continue DAPT. If he should develop evidence of reocclusion then attempt at recrossing and stenting of the dissection would be necessary.     Cardiac cath 01/11/18: Procedures   LEFT HEART CATH AND CORONARY ANGIOGRAPHY  Conclusion     Non-stenotic Ost LAD to Prox LAD lesion previously treated.  Prox LAD lesion is 45% stenosed.  Non-stenotic Prox Cx lesion previously treated.  Ost RPDA to RPDA lesion is 60% stenosed.  The left ventricular systolic function is normal.  LV end diastolic pressure is normal.  The left ventricular ejection fraction is 55-65% by visual estimate.   1. Nonobstructive CAD. The prior stents in the LAD and LCx are widely patent. The prior wire dissection in the OM3  has healed completely 2. Normal LV function 3. Normal LVEDP  Plan: continue medical therapy. Consider alternative causes for his symptoms.    Cardiac cath/PCI 08/03/18: CORONARY STENT INTERVENTION  LEFT HEART CATH AND CORONARY ANGIOGRAPHY  Conclusion     Prox LAD lesion is 45% stenosed.  Ost RPDA to RPDA lesion is 80% stenosed.  A drug-eluting stent was successfully placed using a STENT SYNERGY DES 2.25X12.  Post intervention, there is a 0% residual stenosis.  Ost LAD to Prox LAD lesion is 35% stenosed.  Prox Cx-1 lesion is 95% stenosed.  Post intervention, there is a 0% residual stenosis.  A drug-eluting stent was successfully placed using a STENT SYNERGY DES 3X12.  Non-stenotic Prox Cx-2 lesion was previously treated.  Post Atrio lesion is 30% stenosed.  LV end diastolic pressure is normal.   1. 2 vessel obstructive CAD    - patent stent in the proximal LAD. Diffuse 45-50% mid LAD    - 95% stenosis in the LCx at the proximal stent margin    - 80% PDA 2. Normal LVEDP 3. Successful PCI of the LCx with DES x 1 overlapping the prior stent 4. Successful PCI of the PDA with DES x 1.   Plan: Anticipate same day discharge.   Recommend uninterrupted dual antiplatelet therapy with Aspirin 81mg  daily and Clopidogrel 75 mg daily for a minimum of 12 months (ACS - Class I recommendation).      ASSESSMENT AND PLAN:  1.   CAD s/p remote Anterior STEMI in 2012 treated with DES of LAD.  NSTEMI in December 2018. He had DES  of LCx with abnormal FFR. Subsequent wire dissection of OM 3 treated medically.  Repeat cardiac cath this April 2019showed nonobstructive disease and healed wire dissection of OM3. Presented with USAP earlier this month. Sheppard to have new high grade stenosis in the LCx at the proximal stent margin and progressive disease in the PDA. These were both successfully stented with DES. He is now feeling great with complete resolution of his symptoms.  2.  Hypercholesterolemia. Lipids are not ideal. Intolerance to statins. Now enrolled in the [place] 4 trial.  3. HTN. BP elevated today but has been low in the evening. Will take losartan and 12.5 mg metoprolol in the am and amlodipine and 25 mg metoprolol in the pm.  4. Carotid arterial disease   Signed, Kedron Uno [name], MD  08/22/2018 11:38 AM    [facility] [address] [place], [place], [address] Phone [phone], Fax [phone]

## 2018-08-22 ENCOUNTER — Ambulatory Visit: Payer: PPO | Admitting: Cardiology

## 2018-08-22 ENCOUNTER — Encounter: Payer: Self-pay | Admitting: Cardiology

## 2018-08-22 VITALS — BP 156/88 | HR 68 | Ht 66.0 in | Wt 183.0 lb

## 2018-08-22 DIAGNOSIS — I2510 Atherosclerotic heart disease of native coronary artery without angina pectoris: Secondary | ICD-10-CM

## 2018-08-22 DIAGNOSIS — I10 Essential (primary) hypertension: Secondary | ICD-10-CM | POA: Diagnosis not present

## 2018-08-22 DIAGNOSIS — Z9861 Coronary angioplasty status: Secondary | ICD-10-CM

## 2018-08-22 DIAGNOSIS — E782 Mixed hyperlipidemia: Secondary | ICD-10-CM | POA: Diagnosis not present

## 2018-09-11 ENCOUNTER — Encounter: Payer: PPO | Admitting: *Deleted

## 2018-09-11 DIAGNOSIS — Z006 Encounter for examination for normal comparison and control in clinical research program: Secondary | ICD-10-CM

## 2018-09-11 NOTE — Research (Signed)
Subject to Research clinic for re-screening in the [place] 4 trial.  Subject met inclusion and exclusion criteria.  The informed consent form, study requirements and expectations were reviewed with the subject and questions and concerns were addressed prior to the signing of the consent form.  The subject verbalized understanding of the trial requirements.  The subject agreed to participate in the [place] 4 trial and signed the informed consent.  The informed consent was obtained prior to performance of any protocol-specific procedures for the subject.  A copy of the signed informed consent was given to the subject and a copy was placed in the subject's medical record.  Subject started in run-in, injection given and next appointment scheduled.

## 2018-11-03 ENCOUNTER — Other Ambulatory Visit: Payer: Self-pay | Admitting: Cardiology

## 2018-11-12 ENCOUNTER — Telehealth: Payer: Self-pay | Admitting: *Deleted

## 2018-11-12 NOTE — Telephone Encounter (Signed)
[name] called the office 11/06/2018 to state that he did not want to continue in the [place] 4 research study.  He said that he had developed myalgias from the injection during the run in phase.

## 2018-12-01 ENCOUNTER — Other Ambulatory Visit: Payer: Self-pay | Admitting: Cardiology

## 2019-01-26 ENCOUNTER — Other Ambulatory Visit: Payer: Self-pay

## 2019-01-28 MED ORDER — LOSARTAN POTASSIUM 25 MG PO TABS: ORAL_TABLET | ORAL | 3 refills | Status: DC

## 2019-02-23 ENCOUNTER — Other Ambulatory Visit: Payer: Self-pay | Admitting: Physician Assistant

## 2019-02-25 MED ORDER — METOPROLOL TARTRATE 25 MG PO TABS: ORAL_TABLET | ORAL | 0 refills | Status: DC

## 2019-02-25 MED ORDER — CLOPIDOGREL BISULFATE 75 MG PO TABS: 75.0000 mg | ORAL_TABLET | Freq: Every day | ORAL | 0 refills | Status: DC

## 2019-02-26 ENCOUNTER — Telehealth: Payer: PPO | Admitting: Cardiology

## 2019-05-24 ENCOUNTER — Ambulatory Visit: Payer: PPO | Admitting: Cardiovascular Disease

## 2019-05-24 ENCOUNTER — Telehealth: Payer: Self-pay

## 2019-05-24 NOTE — Telephone Encounter (Signed)
Spoke to patient about email he sent this morning.He stated for the past 3 weeks he has been having chest pressure and elevated B/P.Appointment was scheduled with DOD Dr.Berry today.Patient refused to see Dr.Berry.Appointment at office scheduled with Almyra Deforest PA 05/30/19 at 8:15 am.Advised to go to ED if needed.

## 2019-05-24 NOTE — Telephone Encounter (Signed)
This encounter was created in error - please disregard.

## 2019-05-24 NOTE — Telephone Encounter (Signed)
It looks like the only opening I have in September is on the 15th but for some reason I am not starting til 8:40. Is there a staff meeting that day? If I can't see him then it would be better for him to see an APP  Breia Ocampo [name] MD, [facility]

## 2019-05-30 ENCOUNTER — Ambulatory Visit: Payer: PPO | Admitting: Physician Assistant

## 2019-05-30 ENCOUNTER — Encounter: Payer: Self-pay | Admitting: Physician Assistant

## 2019-05-30 ENCOUNTER — Other Ambulatory Visit: Payer: Self-pay

## 2019-05-30 ENCOUNTER — Telehealth: Payer: Self-pay | Admitting: Cardiology

## 2019-05-30 VITALS — BP 172/98 | HR 60 | Ht 67.0 in | Wt 182.6 lb

## 2019-05-30 DIAGNOSIS — Z9861 Coronary angioplasty status: Secondary | ICD-10-CM | POA: Diagnosis not present

## 2019-05-30 DIAGNOSIS — W57XXXD Bitten or stung by nonvenomous insect and other nonvenomous arthropods, subsequent encounter: Secondary | ICD-10-CM

## 2019-05-30 DIAGNOSIS — I200 Unstable angina: Secondary | ICD-10-CM

## 2019-05-30 DIAGNOSIS — I2510 Atherosclerotic heart disease of native coronary artery without angina pectoris: Secondary | ICD-10-CM

## 2019-05-30 DIAGNOSIS — Z01812 Encounter for preprocedural laboratory examination: Secondary | ICD-10-CM | POA: Diagnosis not present

## 2019-05-30 DIAGNOSIS — Z125 Encounter for screening for malignant neoplasm of prostate: Secondary | ICD-10-CM | POA: Diagnosis not present

## 2019-05-30 MED ORDER — CLOPIDOGREL BISULFATE 75 MG PO TABS: 75.0000 mg | ORAL_TABLET | Freq: Every day | ORAL | 3 refills | Status: DC

## 2019-05-30 MED ORDER — NITROGLYCERIN 0.4 MG SL SUBL: 0.4000 mg | SUBLINGUAL_TABLET | SUBLINGUAL | 3 refills | Status: DC | PRN

## 2019-05-30 MED ORDER — AMLODIPINE BESYLATE 5 MG PO TABS: 5.0000 mg | ORAL_TABLET | Freq: Every day | ORAL | 3 refills | Status: DC

## 2019-05-30 MED ORDER — LOSARTAN POTASSIUM 25 MG PO TABS: 25.0000 mg | ORAL_TABLET | Freq: Every day | ORAL | 3 refills | Status: DC

## 2019-05-30 MED ORDER — METOPROLOL TARTRATE 25 MG PO TABS: ORAL_TABLET | ORAL | 3 refills | Status: DC

## 2019-05-30 NOTE — Telephone Encounter (Signed)
Called patient, but, wife said he was not available.  She will have him call back to schedule 3-4 week followup with Eulas Post.

## 2019-05-30 NOTE — [place] Notes (Signed)
Cardiology Office Note    Date:  05/31/2019   ID:  [name], DOB 1951-07-31, MRN [id number]  PCP:  [name], MD  Cardiologist:  Dr. [name]  Chief Complaint  Patient presents with  . other    Chest Pressure, sob with exertion.Medications reviewed verbally.ressure.    History of Present Illness:  [name] is a 68 y.o. male with PMH of CAD, HTN, HLD (intolerant of statins), sinus bradycardia, history of provoked DVT, PVCs on previous 48-hour monitor, mild carotid artery disease and prostate cancer.  Previous cardiac catheterization performed in the setting of an STEMI on 09/21/2017 showed patent LAD stent with 50% mid LAD lesion, 60% mid left circumflex lesion, and 60% PDA lesion.  PDA lesion is unchanged from 2012.  Left circumflex lesion was significant by FFR and he underwent PCI with DES.  Later that night he had recurrent chest pain and was taken back to the Cath Lab.  Cardiac catheterization revealed patent left circumflex stent with dissection into the OM 3.  Medical therapy was planned as any reattempt with crossing with a wire could lead to worsening dissection.  Due to persistent symptoms, he underwent repeat cardiac cath on 01/11/2018, that showed widely patent stents, the wire dissection had healed completely, EF was normal at the time.  He underwent repeat cardiac cath on 08/03/2018 due to exertional chest discomfort.  This showed new restenosis in the left circumflex at the proximal stent margin, there was also progression of disease in the PDA.  Both side were treated with DES.  He was enrolled in [place] 4 trial.  Based on the last note in February 2020, he did not wish to continue in [place] 4 trial due to myalgia after injection.  He was last seen by Dr. [name] on 08/22/2018 at which time he was doing well.  Patient presents today for cardiology office evaluation.  He started having recurrent exertional chest tightness and dyspnea since August 7th.  He does not notice  the symptom at rest.  Since then, he also noted his blood pressure has been drifting higher into the 160s despite previously was very well controlled.  I plan to increase his amlodipine to 5 mg daily.  He says his symptom is reminiscent of the previous angina.  I discussed the case with Dr. [name], we plan to proceed with cardiac catheterization.    Past Medical History:  Diagnosis Date  . Abnormality of aortic valve    a. possibly bicuspid.  . Asthma    Childhood  . Carotid artery disease ([place])    a. 1-39% bilaterally in 2017.  Marland Kitchen Coronary artery disease    a. anterior STEMI 04/2011, s/p DES to LAD.  Marland Kitchen DVT (deep venous thrombosis) (HCC)    a. tx with Xarelto x 5 months, stopped due to hematuria.  . Frequent PVCs    a. seen on Holter monitor.  . Hyperlipidemia, mixed   . Hypertension   . Myocardial infarction ([place]) 05/21/2011   anterior wall, treated by Dr. [name] with 3.0x73mm Promus drug eluting stent to proximal LAD   . Peripheral arterial disease (HCC)    moderate left ICA disease, mild right ICA disease  . Prostate cancer ([place]) 2007  . Sinus bradycardia    a. HR 50s at home.  . Statin intolerance   . Unstable angina ([place]) 08/03/2018    Past Surgical History:  Procedure Laterality Date  . CARDIAC CATHETERIZATION  05/23/2011, 05/21/2011   05/21/2011  3.0 x 24mm Promus Element drug eluting stent positioned in proximal LAD, stenosis taken from 99% to 0%.  . CORONARY STENT INTERVENTION N/A 09/21/2017   Procedure: CORONARY STENT INTERVENTION;  Surgeon: [name], MD;  Location: [facility];  Service: Cardiovascular;  Laterality: N/A;  . CORONARY STENT INTERVENTION N/A 08/03/2018   Procedure: CORONARY STENT INTERVENTION;  Surgeon: [name], Peter M, MD;  Location: [facility];  Service: Cardiovascular;  Laterality: N/A;  . CORONARY/GRAFT ACUTE MI REVASCULARIZATION N/A 09/21/2017   Procedure: Coronary/Graft Acute MI Revascularization;  Surgeon: [name],  Peter M, MD;  Location: [facility];  Service: Cardiovascular;  Laterality: N/A;  . HERNIA REPAIR    . INTRAVASCULAR PRESSURE WIRE/FFR STUDY N/A 09/21/2017   Procedure: INTRAVASCULAR PRESSURE WIRE/FFR STUDY;  Surgeon: [name], MD;  Location: [facility];  Service: Cardiovascular;  Laterality: N/A;  . LEFT HEART CATH AND CORONARY ANGIOGRAPHY N/A 09/21/2017   Procedure: LEFT HEART CATH AND CORONARY ANGIOGRAPHY;  Surgeon: [name], Peter M, MD;  Location: [facility];  Service: Cardiovascular;  Laterality: N/A;  . LEFT HEART CATH AND CORONARY ANGIOGRAPHY N/A 09/21/2017   Procedure: LEFT HEART CATH AND CORONARY ANGIOGRAPHY;  Surgeon: [name], MD;  Location: [facility];  Service: Cardiovascular;  Laterality: N/A;  . LEFT HEART CATH AND CORONARY ANGIOGRAPHY N/A 01/11/2018   Procedure: LEFT HEART CATH AND CORONARY ANGIOGRAPHY;  Surgeon: [name], Peter M, MD;  Location: [facility];  Service: Cardiovascular;  Laterality: N/A;  . LEFT HEART CATH AND CORONARY ANGIOGRAPHY N/A 08/03/2018   Procedure: LEFT HEART CATH AND CORONARY ANGIOGRAPHY;  Surgeon: [name], Peter M, MD;  Location: [facility];  Service: Cardiovascular;  Laterality: N/A;  . PROSTATECTOMY  2007    Current Medications: Outpatient Medications Prior to Visit  Medication Sig Dispense Refill  . aspirin 81 MG tablet Take 81 mg by mouth daily.     . Coenzyme Q10 (CO Q-10) 100 MG CAPS Take 100 mg by mouth daily.     Marland Kitchen acetaminophen (TYLENOL) 500 MG tablet Take 1,000 mg by mouth daily as needed for moderate pain or headache.    Marland Kitchen amLODipine (NORVASC) 2.5 MG tablet Take 2.5 mg every afternoon 180 tablet 3  . clopidogrel (PLAVIX) 75 MG tablet Take 1 tablet (75 mg total) by mouth daily. 90 tablet 0  . losartan (COZAAR) 25 MG tablet TAKE ONE TABLET BY MOUTH ONCE DAILY. 90 tablet 3  . metoprolol tartrate (LOPRESSOR) 25 MG tablet Take 1/2 tablet (12.5 mg) in morning and take 1 tablet (25 mg) in  afternoon 180 tablet 0  . Multiple Vitamins-Minerals (MULTIVITAMIN PO) Take 1 tablet by mouth daily.    . nitroGLYCERIN (NITROSTAT) 0.4 MG SL tablet PLACE 1 UNDER TONGUE EVERY 5 MINUTES UP TO 3 DOSES AS NEEDED FOR CHEST PAIN. (Patient taking differently: Place 0.4 mg under the tongue every 5 (five) minutes as needed for chest pain. ) 25 tablet 3   No facility-administered medications prior to visit.      Allergies:   Adhesive [tape], Demerol [meperidine], Fentanyl, and Statins   Social History   Socioeconomic History  . Marital status: Married    Spouse name: Not on file  . Number of children: 0  . Years of education: Not on file  . Highest education level: Not on file  Occupational History  . Not on file  Social Needs  . Financial resource strain: Not on file  .  Food insecurity    Worry: Not on file    Inability: Not on file  . Transportation needs    Medical: Not on file    Non-medical: Not on file  Tobacco Use  . Smoking status: Former Smoker    Packs/day: 1.00    Years: 7.00    Pack years: 7.00    Types: Cigarettes    Start date: 03/05/1970    Quit date: 09/26/1976    Years since quitting: 42.7  . Smokeless tobacco: Never Used  Substance and Sexual Activity  . Alcohol use: No    Alcohol/week: 0.0 standard drinks  . Drug use: No  . Sexual activity: Never  Lifestyle  . Physical activity    Days per week: Not on file    Minutes per session: Not on file  . Stress: Not on file  Relationships  . Social Herbalist on phone: Not on file    Gets together: Not on file    Attends religious service: Not on file    Active member of club or organization: Not on file    Attends meetings of clubs or organizations: Not on file    Relationship status: Not on file  Other Topics Concern  . Not on file  Social History Narrative  . Not on file     Family History:  The patient's family history includes Heart failure in his father; Leukemia in his mother.   ROS:    Please see the history of present illness.    ROS All other systems reviewed and are negative.   PHYSICAL EXAM:   VS:  BP (!) 172/98 (BP Location: Left Arm, Patient Position: Sitting, Cuff Size: Normal)   Pulse 60   Ht 5\' 7"  (1.702 m)   Wt 182 lb 9.6 oz (82.8 kg)   SpO2 97%   BMI 28.60 kg/m    GEN: Well nourished, well developed, in no acute distress  HEENT: normal  Neck: no JVD, carotid bruits, or masses Cardiac: RRR; no murmurs, rubs, or gallops,no edema  Respiratory:  clear to auscultation bilaterally, normal work of breathing GI: soft, nontender, nondistended, + BS MS: no deformity or atrophy  Skin: warm and dry, no rash Neuro:  Alert and Oriented x 3, Strength and sensation are intact Psych: euthymic mood, full affect  Wt Readings from Last 3 Encounters:  05/30/19 182 lb 9.6 oz (82.8 kg)  08/22/18 183 lb (83 kg)  08/03/18 185 lb (83.9 kg)      Studies/Labs Reviewed:   EKG:  EKG is ordered today.  The ekg ordered today demonstrates sinus bradycardia without significant ST-T wave changes.  Recent Labs: 05/30/2019: ALT 20; BUN 13; Creatinine, Ser 0.93; Hemoglobin 16.6; Platelets 226; Potassium 5.2; Sodium 142   Lipid Panel    Component Value Date/Time   CHOL 204 (H) 05/30/2019 0949   CHOL 152 03/22/2013 0813   TRIG 105 05/30/2019 0949   TRIG 83 03/22/2013 0813   HDL 42 05/30/2019 0949   HDL 39 (L) 03/22/2013 0813   CHOLHDL 4.9 05/30/2019 0949   CHOLHDL 5.5 12/28/2017 1027   VLDL 43 (H) 12/28/2017 1027   LDLCALC 128 (H) 12/28/2017 1027   LDLCALC 96 03/22/2013 0813    Additional studies/ records that were reviewed today include:   Cath 08/03/2018  Prox LAD lesion is 45% stenosed.  Ost RPDA to RPDA lesion is 80% stenosed.  A drug-eluting stent was successfully placed using a STENT SYNERGY DES 2.25X12.  Post  intervention, there is a 0% residual stenosis.  Ost LAD to Prox LAD lesion is 35% stenosed.  Prox Cx-1 lesion is 95% stenosed.  Post intervention,  there is a 0% residual stenosis.  A drug-eluting stent was successfully placed using a STENT SYNERGY DES 3X12.  Non-stenotic Prox Cx-2 lesion was previously treated.  Post Atrio lesion is 30% stenosed.  LV end diastolic pressure is normal.   1. 2 vessel obstructive CAD    - patent stent in the proximal LAD. Diffuse 45-50% mid LAD    - 95% stenosis in the LCx at the proximal stent margin    - 80% PDA 2. Normal LVEDP 3. Successful PCI of the LCx with DES x 1 overlapping the prior stent 4. Successful PCI of the PDA with DES x 1.   Plan: Anticipate same day discharge.   Recommend uninterrupted dual antiplatelet therapy with Aspirin 81mg  daily and Clopidogrel 75 mg daily for a minimum of 12 months (ACS - Class I recommendation).    ASSESSMENT:    1. Progressive angina ([place])   2. Pre-procedure lab exam   3. Screening PSA (prostate specific antigen)   4. Tick bite, subsequent encounter   5. CAD S/P percutaneous coronary angioplasty      PLAN:  In order of problems listed above:  1. Progressive angina:  -Symptoms reminiscent of the previous angina.  I discussed the case with Dr. [name], I recommend a cardiac catheterization for definitive work-up.  - Risk and benefit of procedure explained to the patient who display clear understanding and agree to proceed. Discussed with patient possible procedural risk include bleeding, vascular injury, renal injury, arrythmia, MI, stroke and loss of limb or life.  -Standard lab work prior to cardiac catheterization.  2. CAD: Continue aspirin and Plavix.  Obtain hemoglobin A1c.  3. Hypertension: Blood pressure elevated.  Increase amlodipine to 5 mg daily  4. Hyperlipidemia: He is not on any statin therapy.  Obtain fasting lipid panel.  If cholesterol is elevated, will refer to the lipid clinic  5. Tick bite: Obtain Lyme titer.  Obtain outpatient request given previous tick bite.  6. PSA screening: At the patient request, we obtained a  PSA test for screening.    Medication Adjustments/Labs and Tests Ordered: Current medicines are reviewed at length with the patient today.  Concerns regarding medicines are outlined above.  Medication changes, Labs and Tests ordered today are listed in the Patient Instructions below. Patient Instructions  Medication Instructions:  Kyle Deforest, PA has recommended making the following medication changes: 1. INCREASE Amlodipine to 5 mg daily  If you need a refill on your cardiac medications before your next appointment, please call your pharmacy.   Lab work: Your physician recommends that you return for lab work TODAY.  If you have labs (blood work) drawn today and your tests are completely normal, you will receive your results only by: Marland Kitchen MyChart Message (if you have MyChart) OR . A paper copy in the mail If you have any lab test that is abnormal or we need to change your treatment, we will call you to review the results.  Testing/Procedures: Your physician has requested that you have a cardiac catheterization. Cardiac catheterization is used to diagnose and/or treat various heart conditions. Doctors may recommend this procedure for a number of different reasons. The most common reason is to evaluate chest pain. Chest pain can be a symptom of coronary artery disease (CAD), and cardiac catheterization can show whether plaque is narrowing or  blocking your heart's arteries. This procedure is also used to evaluate the valves, as well as measure the blood flow and oxygen levels in different parts of your heart. For further information please visit HugeFiesta.tn. Instructions are included below.  You will have to have COVID testing prior to this procedure. You have been scheduled for your testing on Saturday, September 5th, at 12:05 pm. Please proceed to the [facility]). The address is [place] Look for the white tent and stay in the right hand lane.   Follow-Up: At [facility], you and your health needs are our priority.  As part of our continuing mission to provide you with exceptional heart care, we have created designated Provider Care Teams.  These Care Teams include your primary Cardiologist (physician) and Advanced Practice Providers (APPs -  Physician Assistants and Nurse Practitioners) who all work together to provide you with the care you need, when you need it. You will need a follow up appointment in 3 weeks.  Please call our office 2 months in advance to schedule this appointment.  You may see Peter [name], MD or one of the following Advanced Practice Providers on your designated Care Team: [place], [place] . You will receive a reminder letter in the mail two months in advance. If you don't receive a letter, please call our office to schedule the follow-up appointment.      [place] [address] Dept: [phone] Loc: [phone]  CHINEMEREM ALDABA  05/30/2019  You are scheduled for a Cardiac Catheterization on Wednesday, September 9 with Dr. Peter [name].  1. Please arrive at the [facility] (Main Entrance A) at [facility]: [address] [place], [place] [address] at 8:00 AM (This time is two hours before your procedure to ensure your preparation). Free valet parking service is available.   Special note: Every effort is made to have your procedure done on time. Please understand that emergencies sometimes delay scheduled procedures.  2. Diet: Do not eat solid foods after midnight.  The patient may have clear liquids until 5am upon the day of the procedure.  3. Labs: You will need to have blood drawn on TODAY at [place], [place]  Open: 8am - 5pm (Lunch 12:30 - 1:30)   Phone: [phone]. You do not need to be fasting.  4. Medication instructions in preparation for your  procedure:   Contrast Allergy: No   HOLD Losartan the day of your catheterization.  On the morning of your procedure, take your Aspirin and Plavix/Clopidogrel and any morning medicines NOT listed above.  You may use sips of water.  5. Plan for one night stay--bring personal belongings.  6. Bring a current list of your medications and current insurance cards.  7. You MUST have a responsible person to drive you home.  8. Someone MUST be with you the first 24 hours after you arrive home or your discharge will be delayed.  9. Please wear clothes that are easy to get on and off and wear slip-on shoes.  Thank you for allowing [place] to care for you!   -- [facility] Invasive Cardiovascular services    Signed, [name], [place]  05/31/2019 10:52 PM    [address], [place], [place]  [address] Phone: [phone]; Fax: [phone]

## 2019-05-30 NOTE — Patient Instructions (Addendum)
Medication Instructions:  Almyra Deforest, PA has recommended making the following medication changes: 1. INCREASE Amlodipine to 5 mg daily  If you need a refill on your cardiac medications before your next appointment, please call your pharmacy.   Lab work: Your physician recommends that you return for lab work TODAY.  If you have labs (blood work) drawn today and your tests are completely normal, you will receive your results only by: Marland Kitchen MyChart Message (if you have MyChart) OR . A paper copy in the mail If you have any lab test that is abnormal or we need to change your treatment, we will call you to review the results.  Testing/Procedures: Your physician has requested that you have a cardiac catheterization. Cardiac catheterization is used to diagnose and/or treat various heart conditions. Doctors may recommend this procedure for a number of different reasons. The most common reason is to evaluate chest pain. Chest pain can be a symptom of coronary artery disease (CAD), and cardiac catheterization can show whether plaque is narrowing or blocking your heart's arteries. This procedure is also used to evaluate the valves, as well as measure the blood flow and oxygen levels in different parts of your heart. For further information please visit HugeFiesta.tn. Instructions are included below.  You will have to have COVID testing prior to this procedure. You have been scheduled for your testing on Saturday, September 5th, at 12:05 pm. Please proceed to the [facility]). The address is [place] Look for the white tent and stay in the right hand lane.  Follow-Up: At [facility], you and your health needs are our priority.  As part of our continuing mission to provide you with exceptional heart care, we have created designated Provider Care Teams.  These Care Teams include your primary Cardiologist (physician) and Advanced Practice Providers (APPs -  Physician  Assistants and Nurse Practitioners) who all work together to provide you with the care you need, when you need it. You will need a follow up appointment in 3 weeks.  Please call our office 2 months in advance to schedule this appointment.  You may see Peter [name], MD or one of the following Advanced Practice Providers on your designated Care Team: [place], [place] . You will receive a reminder letter in the mail two months in advance. If you don't receive a letter, please call our office to schedule the follow-up appointment.      [place] [address] Dept: [phone] Loc: [phone]  MICHOLAS KRUMHOLZ  05/30/2019  You are scheduled for a Cardiac Catheterization on Wednesday, September 9 with Dr. Peter [name].  1. Please arrive at the [facility] (Main Entrance A) at [facility]: [address] [place], [place] [address] at 8:00 AM (This time is two hours before your procedure to ensure your preparation). Free valet parking service is available.   Special note: Every effort is made to have your procedure done on time. Please understand that emergencies sometimes delay scheduled procedures.  2. Diet: Do not eat solid foods after midnight.  The patient may have clear liquids until 5am upon the day of the procedure.  3. Labs: You will need to have blood drawn on TODAY at [place], [place]  Open: 8am - 5pm (Lunch 12:30 - 1:30)   Phone: [phone]. You do not need to be fasting.  4.  Medication instructions in preparation for your procedure:   Contrast Allergy: No   HOLD Losartan the day of your catheterization.  On the morning of your procedure, take your Aspirin and Plavix/Clopidogrel and any morning medicines NOT listed above.  You may use sips of water.  5. Plan for one night stay--bring personal belongings.  6. Bring a current  list of your medications and current insurance cards.  7. You MUST have a responsible person to drive you home.  8. Someone MUST be with you the first 24 hours after you arrive home or your discharge will be delayed.  9. Please wear clothes that are easy to get on and off and wear slip-on shoes.  Thank you for allowing [place] to care for you!   -- [place] Invasive Cardiovascular services

## 2019-05-30 NOTE — H&P (View-Only) (Signed)
Cardiology Office Note    Date:  05/31/2019   ID:  VIBHAV CUBIAS, DOB Nov 23, 1950, MRN [id number]  PCP:  [name], MD  Cardiologist:  Dr. [name]  Chief Complaint  Patient presents with  . other    Chest Pressure, sob with exertion.Medications reviewed verbally.ressure.    History of Present Illness:  CODY SAFFOLD is a 68 y.o. male with PMH of CAD, HTN, HLD (intolerant of statins), sinus bradycardia, history of provoked DVT, PVCs on previous 48-hour monitor, mild carotid artery disease and prostate cancer.  Previous cardiac catheterization performed in the setting of an STEMI on 09/21/2017 showed patent LAD stent with 50% mid LAD lesion, 60% mid left circumflex lesion, and 60% PDA lesion.  PDA lesion is unchanged from 2012.  Left circumflex lesion was significant by FFR and he underwent PCI with DES.  Later that night he had recurrent chest pain and was taken back to the Cath Lab.  Cardiac catheterization revealed patent left circumflex stent with dissection into the OM 3.  Medical therapy was planned as any reattempt with crossing with a wire could lead to worsening dissection.  Due to persistent symptoms, he underwent repeat cardiac cath on 01/11/2018, that showed widely patent stents, the wire dissection had healed completely, EF was normal at the time.  He underwent repeat cardiac cath on 08/03/2018 due to exertional chest discomfort.  This showed new restenosis in the left circumflex at the proximal stent margin, there was also progression of disease in the PDA.  Both side were treated with DES.  He was enrolled in [place] 4 trial.  Based on the last note in February 2020, he did not wish to continue in [place] 4 trial due to myalgia after injection.  He was last seen by Dr. [name] on 08/22/2018 at which time he was doing well.  Patient presents today for cardiology office evaluation.  He started having recurrent exertional chest tightness and dyspnea since August 7th.  He does not notice  the symptom at rest.  Since then, he also noted his blood pressure has been drifting higher into the 160s despite previously was very well controlled.  I plan to increase his amlodipine to 5 mg daily.  He says his symptom is reminiscent of the previous angina.  I discussed the case with Dr. [name], we plan to proceed with cardiac catheterization.    Past Medical History:  Diagnosis Date  . Abnormality of aortic valve    a. possibly bicuspid.  . Asthma    Childhood  . Carotid artery disease ([place])    a. 1-39% bilaterally in 2017.  Marland Kitchen Coronary artery disease    a. anterior STEMI 04/2011, s/p DES to LAD.  Marland Kitchen DVT (deep venous thrombosis) (HCC)    a. tx with Xarelto x 5 months, stopped due to hematuria.  . Frequent PVCs    a. seen on Holter monitor.  . Hyperlipidemia, mixed   . Hypertension   . Myocardial infarction ([place]) 05/21/2011   anterior wall, treated by Dr. [name] with 3.0x79mm Promus drug eluting stent to proximal LAD   . Peripheral arterial disease (H[facility])    moderate left ICA disease, mild right ICA disease  . Prostate cancer ([place]) 2007  . Sinus bradycardia    a. HR 50s at home.  . Statin intolerance   . Unstable angina ([place]) 08/03/2018    Past Surgical History:  Procedure Laterality Date  . CARDIAC CATHETERIZATION  05/23/2011, 05/21/2011   05/21/2011  3.0 x 22mm Promus Element drug eluting stent positioned in proximal LAD, stenosis taken from 99% to 0%.  . CORONARY STENT INTERVENTION N/A 09/21/2017   Procedure: CORONARY STENT INTERVENTION;  Surgeon: [name], MD;  Location: [place] [facility];  Service: Cardiovascular;  Laterality: N/A;  . CORONARY STENT INTERVENTION N/A 08/03/2018   Procedure: CORONARY STENT INTERVENTION;  Surgeon: [name], Peter M, MD;  Location: [place] [facility];  Service: Cardiovascular;  Laterality: N/A;  . CORONARY/GRAFT ACUTE MI REVASCULARIZATION N/A 09/21/2017   Procedure: Coronary/Graft Acute MI Revascularization;  Surgeon: [name],  Peter M, MD;  Location: [facility];  Service: Cardiovascular;  Laterality: N/A;  . HERNIA REPAIR    . INTRAVASCULAR PRESSURE WIRE/FFR STUDY N/A 09/21/2017   Procedure: INTRAVASCULAR PRESSURE WIRE/FFR STUDY;  Surgeon: [name], MD;  Location: [facility];  Service: Cardiovascular;  Laterality: N/A;  . LEFT HEART CATH AND CORONARY ANGIOGRAPHY N/A 09/21/2017   Procedure: LEFT HEART CATH AND CORONARY ANGIOGRAPHY;  Surgeon: [name], Peter M, MD;  Location: [place] [facility];  Service: Cardiovascular;  Laterality: N/A;  . LEFT HEART CATH AND CORONARY ANGIOGRAPHY N/A 09/21/2017   Procedure: LEFT HEART CATH AND CORONARY ANGIOGRAPHY;  Surgeon: [name], MD;  Location: [facility];  Service: Cardiovascular;  Laterality: N/A;  . LEFT HEART CATH AND CORONARY ANGIOGRAPHY N/A 01/11/2018   Procedure: LEFT HEART CATH AND CORONARY ANGIOGRAPHY;  Surgeon: [name], Peter M, MD;  Location: [facility];  Service: Cardiovascular;  Laterality: N/A;  . LEFT HEART CATH AND CORONARY ANGIOGRAPHY N/A 08/03/2018   Procedure: LEFT HEART CATH AND CORONARY ANGIOGRAPHY;  Surgeon: [name], Peter M, MD;  Location: [facility] [place] [facility];  Service: Cardiovascular;  Laterality: N/A;  . PROSTATECTOMY  2007    Current Medications: Outpatient Medications Prior to Visit  Medication Sig Dispense Refill  . aspirin 81 MG tablet Take 81 mg by mouth daily.     . Coenzyme Q10 (CO Q-10) 100 MG CAPS Take 100 mg by mouth daily.     Marland Kitchen acetaminophen (TYLENOL) 500 MG tablet Take 1,000 mg by mouth daily as needed for moderate pain or headache.    Marland Kitchen amLODipine (NORVASC) 2.5 MG tablet Take 2.5 mg every afternoon 180 tablet 3  . clopidogrel (PLAVIX) 75 MG tablet Take 1 tablet (75 mg total) by mouth daily. 90 tablet 0  . losartan (COZAAR) 25 MG tablet TAKE ONE TABLET BY MOUTH ONCE DAILY. 90 tablet 3  . metoprolol tartrate (LOPRESSOR) 25 MG tablet Take 1/2 tablet (12.5 mg) in morning and take 1 tablet (25 mg) in  afternoon 180 tablet 0  . Multiple Vitamins-Minerals (MULTIVITAMIN PO) Take 1 tablet by mouth daily.    . nitroGLYCERIN (NITROSTAT) 0.4 MG SL tablet PLACE 1 UNDER TONGUE EVERY 5 MINUTES UP TO 3 DOSES AS NEEDED FOR CHEST PAIN. (Patient taking differently: Place 0.4 mg under the tongue every 5 (five) minutes as needed for chest pain. ) 25 tablet 3   No facility-administered medications prior to visit.      Allergies:   Adhesive [tape], Demerol [meperidine], Fentanyl, and Statins   Social History   Socioeconomic History  . Marital status: Married    Spouse name: Not on file  . Number of children: 0  . Years of education: Not on file  . Highest education level: Not on file  Occupational History  . Not on file  Social Needs  . Financial resource strain: Not on file  .  Food insecurity    Worry: Not on file    Inability: Not on file  . Transportation needs    Medical: Not on file    Non-medical: Not on file  Tobacco Use  . Smoking status: Former Smoker    Packs/day: 1.00    Years: 7.00    Pack years: 7.00    Types: Cigarettes    Start date: 03/05/1970    Quit date: 09/26/1976    Years since quitting: 42.7  . Smokeless tobacco: Never Used  Substance and Sexual Activity  . Alcohol use: No    Alcohol/week: 0.0 standard drinks  . Drug use: No  . Sexual activity: Never  Lifestyle  . Physical activity    Days per week: Not on file    Minutes per session: Not on file  . Stress: Not on file  Relationships  . Social Herbalist on phone: Not on file    Gets together: Not on file    Attends religious service: Not on file    Active member of club or organization: Not on file    Attends meetings of clubs or organizations: Not on file    Relationship status: Not on file  Other Topics Concern  . Not on file  Social History Narrative  . Not on file     Family History:  The patient's family history includes Heart failure in his father; Leukemia in his mother.   ROS:    Please see the history of present illness.    ROS All other systems reviewed and are negative.   PHYSICAL EXAM:   VS:  BP (!) 172/98 (BP Location: Left Arm, Patient Position: Sitting, Cuff Size: Normal)   Pulse 60   Ht 5\' 7"  (1.702 m)   Wt 182 lb 9.6 oz (82.8 kg)   SpO2 97%   BMI 28.60 kg/m    GEN: Well nourished, well developed, in no acute distress  HEENT: normal  Neck: no JVD, carotid bruits, or masses Cardiac: RRR; no murmurs, rubs, or gallops,no edema  Respiratory:  clear to auscultation bilaterally, normal work of breathing GI: soft, nontender, nondistended, + BS MS: no deformity or atrophy  Skin: warm and dry, no rash Neuro:  Alert and Oriented x 3, Strength and sensation are intact Psych: euthymic mood, full affect  Wt Readings from Last 3 Encounters:  05/30/19 182 lb 9.6 oz (82.8 kg)  08/22/18 183 lb (83 kg)  08/03/18 185 lb (83.9 kg)      Studies/Labs Reviewed:   EKG:  EKG is ordered today.  The ekg ordered today demonstrates sinus bradycardia without significant ST-T wave changes.  Recent Labs: 05/30/2019: ALT 20; BUN 13; Creatinine, Ser 0.93; Hemoglobin 16.6; Platelets 226; Potassium 5.2; Sodium 142   Lipid Panel    Component Value Date/Time   CHOL 204 (H) 05/30/2019 0949   CHOL 152 03/22/2013 0813   TRIG 105 05/30/2019 0949   TRIG 83 03/22/2013 0813   HDL 42 05/30/2019 0949   HDL 39 (L) 03/22/2013 0813   CHOLHDL 4.9 05/30/2019 0949   CHOLHDL 5.5 12/28/2017 1027   VLDL 43 (H) 12/28/2017 1027   LDLCALC 128 (H) 12/28/2017 1027   LDLCALC 96 03/22/2013 0813    Additional studies/ records that were reviewed today include:   Cath 08/03/2018  Prox LAD lesion is 45% stenosed.  Ost RPDA to RPDA lesion is 80% stenosed.  A drug-eluting stent was successfully placed using a STENT SYNERGY DES 2.25X12.  Post  intervention, there is a 0% residual stenosis.  Ost LAD to Prox LAD lesion is 35% stenosed.  Prox Cx-1 lesion is 95% stenosed.  Post intervention,  there is a 0% residual stenosis.  A drug-eluting stent was successfully placed using a STENT SYNERGY DES 3X12.  Non-stenotic Prox Cx-2 lesion was previously treated.  Post Atrio lesion is 30% stenosed.  LV end diastolic pressure is normal.   1. 2 vessel obstructive CAD    - patent stent in the proximal LAD. Diffuse 45-50% mid LAD    - 95% stenosis in the LCx at the proximal stent margin    - 80% PDA 2. Normal LVEDP 3. Successful PCI of the LCx with DES x 1 overlapping the prior stent 4. Successful PCI of the PDA with DES x 1.   Plan: Anticipate same day discharge.   Recommend uninterrupted dual antiplatelet therapy with Aspirin 81mg  daily and Clopidogrel 75 mg daily for a minimum of 12 months (ACS - Class I recommendation).    ASSESSMENT:    1. Progressive angina ([place])   2. Pre-procedure lab exam   3. Screening PSA (prostate specific antigen)   4. Tick bite, subsequent encounter   5. CAD S/P percutaneous coronary angioplasty      PLAN:  In order of problems listed above:  1. Progressive angina:  -Symptoms reminiscent of the previous angina.  I discussed the case with Dr. [name], I recommend a cardiac catheterization for definitive work-up.  - Risk and benefit of procedure explained to the patient who display clear understanding and agree to proceed. Discussed with patient possible procedural risk include bleeding, vascular injury, renal injury, arrythmia, MI, stroke and loss of limb or life.  -Standard lab work prior to cardiac catheterization.  2. CAD: Continue aspirin and Plavix.  Obtain hemoglobin A1c.  3. Hypertension: Blood pressure elevated.  Increase amlodipine to 5 mg daily  4. Hyperlipidemia: He is not on any statin therapy.  Obtain fasting lipid panel.  If cholesterol is elevated, will refer to the lipid clinic  5. Tick bite: Obtain Lyme titer.  Obtain outpatient request given previous tick bite.  6. PSA screening: At the patient request, we obtained a  PSA test for screening.    Medication Adjustments/Labs and Tests Ordered: Current medicines are reviewed at length with the patient today.  Concerns regarding medicines are outlined above.  Medication changes, Labs and Tests ordered today are listed in the Patient Instructions below. Patient Instructions  Medication Instructions:  Almyra Deforest, PA has recommended making the following medication changes: 1. INCREASE Amlodipine to 5 mg daily  If you need a refill on your cardiac medications before your next appointment, please call your pharmacy.   Lab work: Your physician recommends that you return for lab work TODAY.  If you have labs (blood work) drawn today and your tests are completely normal, you will receive your results only by: Marland Kitchen MyChart Message (if you have MyChart) OR . A paper copy in the mail If you have any lab test that is abnormal or we need to change your treatment, we will call you to review the results.  Testing/Procedures: Your physician has requested that you have a cardiac catheterization. Cardiac catheterization is used to diagnose and/or treat various heart conditions. Doctors may recommend this procedure for a number of different reasons. The most common reason is to evaluate chest pain. Chest pain can be a symptom of coronary artery disease (CAD), and cardiac catheterization can show whether plaque is narrowing or  blocking your heart's arteries. This procedure is also used to evaluate the valves, as well as measure the blood flow and oxygen levels in different parts of your heart. For further information please visit [email or web address]. Instructions are included below.  You will have to have COVID testing prior to this procedure. You have been scheduled for your testing on Saturday, September 5th, at 12:05 pm. Please proceed to the [facility]). The address is [place] Look for the white tent and stay in the right hand lane.   Follow-Up: At [facility], you and your health needs are our priority.  As part of our continuing mission to provide you with exceptional heart care, we have created designated Provider Care Teams.  These Care Teams include your primary Cardiologist (physician) and Advanced Practice Providers (APPs -  Physician Assistants and Nurse Practitioners) who all work together to provide you with the care you need, when you need it. You will need a follow up appointment in 3 weeks.  Please call our office 2 months in advance to schedule this appointment.  You may see Peter [name], MD or one of the following Advanced Practice Providers on your designated Care Team: [place], [place] . You will receive a reminder letter in the mail two months in advance. If you don't receive a letter, please call our office to schedule the follow-up appointment.      [address] [place] [address] Dept: [phone] Loc: [phone]  TORRY GRADY  05/30/2019  You are scheduled for a Cardiac Catheterization on Wednesday, September 9 with Dr. Peter [name].  1. Please arrive at the [facility] (Main Entrance A) at [facility]: [address] [place], [place] [address] at 8:00 AM (This time is two hours before your procedure to ensure your preparation). Free valet parking service is available.   Special note: Every effort is made to have your procedure done on time. Please understand that emergencies sometimes delay scheduled procedures.  2. Diet: Do not eat solid foods after midnight.  The patient may have clear liquids until 5am upon the day of the procedure.  3. Labs: You will need to have blood drawn on TODAY at [facility], [place]  Open: 8am - 5pm (Lunch 12:30 - 1:30)   Phone: [phone]. You do not need to be fasting.  4. Medication instructions in preparation for your  procedure:   Contrast Allergy: No   HOLD Losartan the day of your catheterization.  On the morning of your procedure, take your Aspirin and Plavix/Clopidogrel and any morning medicines NOT listed above.  You may use sips of water.  5. Plan for one night stay--bring personal belongings.  6. Bring a current list of your medications and current insurance cards.  7. You MUST have a responsible person to drive you home.  8. Someone MUST be with you the first 24 hours after you arrive home or your discharge will be delayed.  9. Please wear clothes that are easy to get on and off and wear slip-on shoes.  Thank you for allowing [place] to care for you!   -- [facility] Invasive Cardiovascular services    Signed, Almyra Deforest, [place]  05/31/2019 10:52 PM    [place] [place] [address] [place], [place], [place]  [address] Phone: [phone]; Fax: [phone]

## 2019-05-31 ENCOUNTER — Encounter: Payer: Self-pay | Admitting: Physician Assistant

## 2019-05-31 ENCOUNTER — Other Ambulatory Visit: Payer: Self-pay | Admitting: Physician Assistant

## 2019-05-31 LAB — COMPREHENSIVE METABOLIC PANEL
ALT: 20 IU/L (ref 0–44)
AST: 21 IU/L (ref 0–40)
Albumin/Globulin Ratio: 2.6 — ABNORMAL HIGH (ref 1.2–2.2)
Albumin: 4.7 g/dL (ref 3.8–4.8)
Alkaline Phosphatase: 80 IU/L (ref 39–117)
BUN/Creatinine Ratio: 14 (ref 10–24)
BUN: 13 mg/dL (ref 8–27)
Bilirubin Total: 0.5 mg/dL (ref 0.0–1.2)
CO2: 23 mmol/L (ref 20–29)
Calcium: 9.5 mg/dL (ref 8.6–10.2)
Chloride: 104 mmol/L (ref 96–106)
Creatinine, Ser: 0.93 mg/dL (ref 0.76–1.27)
GFR calc Af Amer: 96 mL/min/{1.73_m2} (ref >59)
GFR calc non Af Amer: 83 mL/min/{1.73_m2} (ref >59)
Globulin, Total: 1.8 g/dL (ref 1.5–4.5)
Glucose: 92 mg/dL (ref 65–99)
Potassium: 5.2 mmol/L (ref 3.5–5.2)
Sodium: 142 mmol/L (ref 134–144)
Total Protein: 6.5 g/dL (ref 6.0–8.5)

## 2019-05-31 LAB — CBC
Hematocrit: 48 % (ref 37.5–51.0)
Hemoglobin: 16.6 g/dL (ref 13.0–17.7)
MCH: 31.6 pg (ref 26.6–33.0)
MCHC: 34.6 g/dL (ref 31.5–35.7)
MCV: 91 fL (ref 79–97)
Platelets: 226 10*3/uL (ref 150–450)
RBC: 5.26 x10E6/uL (ref 4.14–5.80)
RDW: 12.8 % (ref 11.6–15.4)
WBC: 5.6 10*3/uL (ref 3.4–10.8)

## 2019-05-31 LAB — LIPID PANEL
Chol/HDL Ratio: 4.9 ratio (ref 0.0–5.0)
Cholesterol, Total: 204 mg/dL — ABNORMAL HIGH (ref 100–199)
HDL: 42 mg/dL (ref >39)
LDL Chol Calc (NIH): 143 mg/dL — ABNORMAL HIGH (ref 0–99)
Triglycerides: 105 mg/dL (ref 0–149)
VLDL Cholesterol Cal: 19 mg/dL (ref 5–40)

## 2019-05-31 LAB — HEMOGLOBIN A1C
Est. average glucose Bld gHb Est-mCnc: 108 mg/dL
Hgb A1c MFr Bld: 5.4 % (ref 4.8–5.6)

## 2019-05-31 LAB — LYME AB/WESTERN BLOT REFLEX
LYME DISEASE AB, QUANT, IGM: <0.8 index (ref 0.00–0.79)
Lyme IgG/IgM Ab: <0.91 {ISR} (ref 0.00–0.90)

## 2019-05-31 LAB — PSA: Prostate Specific Ag, Serum: <0.1 ng/mL (ref 0.0–4.0)

## 2019-05-31 MED ORDER — SODIUM CHLORIDE 0.9% FLUSH: 3.0000 mL | Freq: Two times a day (BID) | INTRAVENOUS | Status: DC

## 2019-05-31 NOTE — Progress Notes (Signed)
Hemoglobin A1C normal. Lyme disease test negative. PSA negative. Red blood cell and kidney function ok. Cholesterol uncontrolled, need lipid clinic visit after cath. Forward to PCP

## 2019-06-01 ENCOUNTER — Other Ambulatory Visit (HOSPITAL_COMMUNITY)
Admission: RE | Admit: 2019-06-01 | Discharge: 2019-06-01 | Disposition: A | Discharge status: Home / Self Care | Payer: PPO | Source: Ambulatory Visit | Attending: Cardiology | Admitting: Cardiology

## 2019-06-01 DIAGNOSIS — Z20828 Contact with and (suspected) exposure to other viral communicable diseases: Secondary | ICD-10-CM | POA: Diagnosis not present

## 2019-06-01 DIAGNOSIS — Z01812 Encounter for preprocedural laboratory examination: Secondary | ICD-10-CM | POA: Diagnosis not present

## 2019-06-02 LAB — NOVEL CORONAVIRUS, NAA (HOSP ORDER, SEND-OUT TO REF LAB; TAT 18-24 HRS): SARS-CoV-2, NAA: NOT DETECTED

## 2019-06-04 ENCOUNTER — Telehealth: Payer: Self-pay

## 2019-06-04 NOTE — Telephone Encounter (Signed)
Spoke to patient follow up cath appointment scheduled with Dr.Jordan 06/11/19 at 2:40 pm.

## 2019-06-04 NOTE — Telephone Encounter (Signed)
Reviewed the following pre-procedure instructions with the patient. Patient verbalized understanding and has no questions at this time.  Pt contacted pre-catheterization scheduled at [facility] for:10:00 am Verified arrival time and place: [facility]) at: 8:00 am   No solid food after midnight prior to cath, clear liquids until 5 AM day of procedure. Contrast allergy: No Verified no diabetes medications. No diabetic medications  AM meds can be  taken pre-cath with sip of water including: ASA 81 mg  Patient is instructed to take his plavix the morning of the procedure prior to arrival regardless of the schedule  Confirmed patient has responsible person to drive home post procedure and observe 24 hours after arriving home: Patient has a responsible party to drive him home after the procedure and observe him for 24 hours.  Currently, due to Covid-19 pandemic, only one support person will be allowed with patient. Must be the same support person for that patient's entire stay, will be screened and required to wear a mask. They will be asked to wait in the waiting room for the duration of the patient's stay.  Patients are required to wear a mask when they enter the hospital.      COVID-19 Pre-Screening Questions:  . In the past 7 to 10 days have you had a cough,  shortness of breath, headache, congestion, fever (100 or greater) body aches, chills, sore throat, or sudden loss of taste or sense of smell? No . Have you been around anyone with known Covid 19. No . Have you been around anyone who is awaiting Covid 19 test results in the past 7 to 10 days? No . Have you been around anyone who has been exposed to Covid 19, or has mentioned symptoms of Covid 19 within the past 7 to 10 days? No  If you have any concerns/questions about symptoms patients report during screening (either on the phone or at threshold). Contact the provider seeing the patient or DOD  for further guidance.  If neither are available contact a member of the leadership team.

## 2019-06-05 ENCOUNTER — Other Ambulatory Visit: Payer: Self-pay

## 2019-06-05 ENCOUNTER — Ambulatory Visit (HOSPITAL_BASED_OUTPATIENT_CLINIC_OR_DEPARTMENT_OTHER)
Admission: RE | Admit: 2019-06-05 | Discharge: 2019-06-05 | Disposition: A | Discharge status: Home / Self Care | Payer: PPO | Source: Home / Self Care | Attending: Cardiology | Admitting: Cardiology

## 2019-06-05 ENCOUNTER — Inpatient Hospital Stay (HOSPITAL_COMMUNITY)
Admission: RE | Disposition: A | Discharge status: Home / Self Care | Payer: Self-pay | Source: Home / Self Care | Attending: Cardiology

## 2019-06-05 DIAGNOSIS — Z7902 Long term (current) use of antithrombotics/antiplatelets: Secondary | ICD-10-CM | POA: Diagnosis not present

## 2019-06-05 DIAGNOSIS — Z885 Allergy status to narcotic agent status: Secondary | ICD-10-CM | POA: Diagnosis not present

## 2019-06-05 DIAGNOSIS — Z95820 Peripheral vascular angioplasty status with implants and grafts: Secondary | ICD-10-CM

## 2019-06-05 DIAGNOSIS — R079 Chest pain, unspecified: Secondary | ICD-10-CM | POA: Diagnosis not present

## 2019-06-05 DIAGNOSIS — I10 Essential (primary) hypertension: Secondary | ICD-10-CM | POA: Diagnosis present

## 2019-06-05 DIAGNOSIS — I2510 Atherosclerotic heart disease of native coronary artery without angina pectoris: Secondary | ICD-10-CM

## 2019-06-05 DIAGNOSIS — Z87891 Personal history of nicotine dependence: Secondary | ICD-10-CM | POA: Diagnosis not present

## 2019-06-05 DIAGNOSIS — E782 Mixed hyperlipidemia: Secondary | ICD-10-CM | POA: Diagnosis present

## 2019-06-05 DIAGNOSIS — Z955 Presence of coronary angioplasty implant and graft: Secondary | ICD-10-CM | POA: Diagnosis not present

## 2019-06-05 DIAGNOSIS — I200 Unstable angina: Secondary | ICD-10-CM | POA: Diagnosis not present

## 2019-06-05 DIAGNOSIS — I25119 Atherosclerotic heart disease of native coronary artery with unspecified angina pectoris: Secondary | ICD-10-CM

## 2019-06-05 DIAGNOSIS — I209 Angina pectoris, unspecified: Secondary | ICD-10-CM | POA: Diagnosis present

## 2019-06-05 DIAGNOSIS — Y831 Surgical operation with implant of artificial internal device as the cause of abnormal reaction of the patient, or of later complication, without mention of misadventure at the time of the procedure: Secondary | ICD-10-CM | POA: Diagnosis not present

## 2019-06-05 DIAGNOSIS — T82855A Stenosis of coronary artery stent, initial encounter: Secondary | ICD-10-CM | POA: Diagnosis not present

## 2019-06-05 DIAGNOSIS — Z8546 Personal history of malignant neoplasm of prostate: Secondary | ICD-10-CM | POA: Diagnosis not present

## 2019-06-05 DIAGNOSIS — Z86718 Personal history of other venous thrombosis and embolism: Secondary | ICD-10-CM | POA: Diagnosis not present

## 2019-06-05 DIAGNOSIS — Z91048 Other nonmedicinal substance allergy status: Secondary | ICD-10-CM | POA: Diagnosis not present

## 2019-06-05 DIAGNOSIS — Z8249 Family history of ischemic heart disease and other diseases of the circulatory system: Secondary | ICD-10-CM | POA: Diagnosis not present

## 2019-06-05 DIAGNOSIS — Z888 Allergy status to other drugs, medicaments and biological substances status: Secondary | ICD-10-CM | POA: Diagnosis not present

## 2019-06-05 DIAGNOSIS — Z20828 Contact with and (suspected) exposure to other viral communicable diseases: Secondary | ICD-10-CM | POA: Diagnosis present

## 2019-06-05 DIAGNOSIS — R072 Precordial pain: Secondary | ICD-10-CM | POA: Diagnosis present

## 2019-06-05 DIAGNOSIS — Z79899 Other long term (current) drug therapy: Secondary | ICD-10-CM | POA: Diagnosis not present

## 2019-06-05 DIAGNOSIS — I25110 Atherosclerotic heart disease of native coronary artery with unstable angina pectoris: Secondary | ICD-10-CM | POA: Diagnosis present

## 2019-06-05 DIAGNOSIS — I739 Peripheral vascular disease, unspecified: Secondary | ICD-10-CM | POA: Diagnosis present

## 2019-06-05 DIAGNOSIS — Z9079 Acquired absence of other genital organ(s): Secondary | ICD-10-CM | POA: Diagnosis not present

## 2019-06-05 DIAGNOSIS — Z9861 Coronary angioplasty status: Secondary | ICD-10-CM

## 2019-06-05 DIAGNOSIS — Z7982 Long term (current) use of aspirin: Secondary | ICD-10-CM | POA: Diagnosis not present

## 2019-06-05 DIAGNOSIS — Z806 Family history of leukemia: Secondary | ICD-10-CM | POA: Diagnosis not present

## 2019-06-05 DIAGNOSIS — I252 Old myocardial infarction: Secondary | ICD-10-CM | POA: Diagnosis not present

## 2019-06-05 HISTORY — PX: LEFT HEART CATH AND CORONARY ANGIOGRAPHY: CATH118249

## 2019-06-05 HISTORY — PX: CORONARY STENT INTERVENTION: CATH118234

## 2019-06-05 LAB — POCT ACTIVATED CLOTTING TIME: Activated Clotting Time: 455 seconds

## 2019-06-05 SURGERY — LEFT HEART CATH AND CORONARY ANGIOGRAPHY
Anesthesia: LOCAL

## 2019-06-05 MED ORDER — MIDAZOLAM HCL 2 MG/2ML IJ SOLN
INTRAMUSCULAR | Status: AC
  Filled 2019-06-05: qty 2

## 2019-06-05 MED ORDER — ACETAMINOPHEN 325 MG PO TABS: 650.0000 mg | ORAL_TABLET | ORAL | Status: DC | PRN

## 2019-06-05 MED ORDER — LIDOCAINE HCL (PF) 1 % IJ SOLN
INTRAMUSCULAR | Status: AC
  Filled 2019-06-05: qty 30

## 2019-06-05 MED ORDER — MIDAZOLAM HCL 2 MG/2ML IJ SOLN
INTRAMUSCULAR | Status: DC | PRN
  Administered 2019-06-05: 2 mg via INTRAVENOUS

## 2019-06-05 MED ORDER — NITROGLYCERIN 1 MG/10 ML FOR IR/CATH LAB
INTRA_ARTERIAL | Status: AC
  Filled 2019-06-05: qty 10

## 2019-06-05 MED ORDER — SODIUM CHLORIDE 0.9% FLUSH: 3.0000 mL | Freq: Two times a day (BID) | INTRAVENOUS | Status: DC

## 2019-06-05 MED ORDER — HYDRALAZINE HCL 20 MG/ML IJ SOLN: 10.0000 mg | INTRAMUSCULAR | Status: AC | PRN

## 2019-06-05 MED ORDER — SODIUM CHLORIDE 0.9 % WEIGHT BASED INFUSION
3.0000 mL/kg/h | INTRAVENOUS | Status: AC
  Administered 2019-06-05: 09:00:00 3 mL/kg/h via INTRAVENOUS

## 2019-06-05 MED ORDER — SODIUM CHLORIDE 0.9 % IV SOLN: 250.0000 mL | INTRAVENOUS | Status: DC | PRN

## 2019-06-05 MED ORDER — BIVALIRUDIN BOLUS VIA INFUSION - CUPID
INTRAVENOUS | Status: DC | PRN
  Administered 2019-06-05: 11:00:00 61.95 mg via INTRAVENOUS

## 2019-06-05 MED ORDER — HEPARIN (PORCINE) IN NACL 1000-0.9 UT/500ML-% IV SOLN
INTRAVENOUS | Status: AC
  Filled 2019-06-05: qty 1000

## 2019-06-05 MED ORDER — ASPIRIN 81 MG PO CHEW: 81.0000 mg | CHEWABLE_TABLET | ORAL | Status: DC

## 2019-06-05 MED ORDER — SODIUM CHLORIDE 0.9% FLUSH: 3.0000 mL | INTRAVENOUS | Status: DC | PRN

## 2019-06-05 MED ORDER — SODIUM CHLORIDE 0.9 % WEIGHT BASED INFUSION: 1.0000 mL/kg/h | INTRAVENOUS | Status: DC

## 2019-06-05 MED ORDER — SODIUM CHLORIDE 0.9 % WEIGHT BASED INFUSION: 1.0000 mL/kg/h | INTRAVENOUS | Status: AC

## 2019-06-05 MED ORDER — SODIUM CHLORIDE 0.9 % IV SOLN
INTRAVENOUS | Status: DC | PRN
  Administered 2019-06-05: 11:00:00 1.75 mg/kg/h via INTRAVENOUS

## 2019-06-05 MED ORDER — HEPARIN (PORCINE) IN NACL 1000-0.9 UT/500ML-% IV SOLN
INTRAVENOUS | Status: DC | PRN
  Administered 2019-06-05 (×2): 500 mL

## 2019-06-05 MED ORDER — BIVALIRUDIN TRIFLUOROACETATE 250 MG IV SOLR
INTRAVENOUS | Status: AC
  Filled 2019-06-05: qty 250

## 2019-06-05 MED ORDER — LIDOCAINE HCL (PF) 1 % IJ SOLN
INTRAMUSCULAR | Status: DC | PRN
  Administered 2019-06-05: 15 mL

## 2019-06-05 MED ORDER — AMLODIPINE BESYLATE 5 MG PO TABS: 2.5000 mg | ORAL_TABLET | Freq: Every day | ORAL | Status: DC

## 2019-06-05 MED ORDER — ONDANSETRON HCL 4 MG/2ML IJ SOLN: 4.0000 mg | Freq: Four times a day (QID) | INTRAMUSCULAR | Status: DC | PRN

## 2019-06-05 MED ORDER — IOHEXOL 350 MG/ML SOLN
INTRAVENOUS | Status: DC | PRN
  Administered 2019-06-05: 11:00:00 135 mL via INTRACARDIAC

## 2019-06-05 SURGICAL SUPPLY — 16 items
BALLN SAPPHIRE 2.0X12 (BALLOONS) ×2
BALLN SAPPHIRE ~~LOC~~ 2.75X8 (BALLOONS) ×1 IMPLANT
BALLOON SAPPHIRE 2.0X12 (BALLOONS) IMPLANT
CATH INFINITI 5FR MULTPACK ANG (CATHETERS) ×1 IMPLANT
CATH LAUNCHER 6FR EBU 4 (CATHETERS) ×1 IMPLANT
KIT ENCORE 26 ADVANTAGE (KITS) ×1 IMPLANT
KIT HEART LEFT (KITS) ×2 IMPLANT
PACK CARDIAC CATHETERIZATION (CUSTOM PROCEDURE TRAY) ×2 IMPLANT
SHEATH PINNACLE 5F 10CM (SHEATH) ×1 IMPLANT
SHEATH PINNACLE 6F 10CM (SHEATH) ×1 IMPLANT
SHEATH PROBE COVER 6X72 (BAG) ×1 IMPLANT
STENT RESOLUTE ONYX 2.5X15 (Permanent Stent) ×1 IMPLANT
TRANSDUCER W/STOPCOCK (MISCELLANEOUS) ×2 IMPLANT
TUBING CIL FLEX 10 FLL-RA (TUBING) ×2 IMPLANT
WIRE ASAHI PROWATER 180CM (WIRE) ×1 IMPLANT
WIRE EMERALD 3MM-J .035X150CM (WIRE) ×1 IMPLANT

## 2019-06-05 NOTE — Progress Notes (Signed)
No bleeding or hematoma noted after ambulation 

## 2019-06-05 NOTE — Discharge Summary (Signed)
Discharge Summary/SAME DAY PCI    Patient ID: [name],  MRN: [id number], DOB/AGE: [birth year] 68 y.o.  Admit date: 06/05/2019 Discharge date: 06/05/2019  Primary Care Provider: [name] Primary Cardiologist: Dr. [name]   Discharge Diagnoses    Principal Problem:   Angina pectoris [facility]) Active Problems:   CAD S/P percutaneous coronary angioplasty   HTN (hypertension)   Mixed hyperlipidemia   Allergies Allergies  Allergen Reactions   Adhesive [Tape] Hives, Itching and Other (See Comments)    Hives  And itching from the adhesive on the pads of the heart monitor    Demerol [Meperidine] Nausea Only and Other (See Comments)    Drop in blood pressure, Muscle spasm   Fentanyl Nausea Only and Other (See Comments)    Drop in blood pressure, Muscle spasm   Statins Other (See Comments)    Myalgias     Diagnostic Studies/Procedures    Cath: 06/05/19   Prox LAD lesion is 45% stenosed.  Ost LAD to Prox LAD lesion is 40% stenosed.  Non-stenotic Prox Cx-2 lesion was previously treated.  Previously placed Prox Cx-1 drug eluting stent is widely patent.  Prox RCA lesion is 25% stenosed.  Previously placed Ost RPDA to RPDA drug eluting stent is widely patent.  RPAV lesion is 30% stenosed.  Mid Cx lesion is 90% stenosed.  A drug-eluting stent was successfully placed using a STENT RESOLUTE ONYX 2.5X15.  Post intervention, there is a 0% residual stenosis.  The left ventricular systolic function is normal.  LV end diastolic pressure is normal.  The left ventricular ejection fraction is 55-65% by visual estimate.   1. Single vessel obstructive CAD. 90% stenosis in the mid LCx distal to prior stents into the OM1 2. Continued stent patency in the LAD, PDA and proximal to mid LCx 3. Normal LV function 4. Normal LVEDP 5. Successful PCI of the mid LCx/OM1 with DES x 1 in overlapping fashion  Plan: DAPT indefinitely. He is a candidate for same day discharge.  Needs referral to lipid clinic to address his hypercholesterolemia and intolerance to statins. With aggressive CAD target LDL <50.   Diagnostic Dominance: Right  Intervention    _____________   History of Present Illness     [name] is a 68 y.o. male with PMH of CAD, HTN, HLD (intolerant of statins), sinus bradycardia, history of provoked DVT, PVCs on previous 48-hour monitor, mild carotid artery disease and prostate cancer. Previous cardiac catheterization performed in the setting of an STEMI on 09/21/2017 showed patent LAD stent with 50% mid LAD lesion, 60% mid left circumflex lesion, and 60% PDA lesion.  PDA lesion is unchanged from 2012.  Left circumflex lesion was significant by FFR and he underwent PCI with DES.  Later that night he had recurrent chest pain and was taken back to the Cath Lab.  Cardiac catheterization revealed patent left circumflex stent with dissection into the OM 3.  Medical therapy was planned as any reattempt with crossing with a wire could lead to worsening dissection.  Due to persistent symptoms, he underwent repeat cardiac cath on 01/11/2018, that showed widely patent stents, the wire dissection had healed completely, EF was normal at the time.  He underwent repeat cardiac cath on 08/03/2018 due to exertional chest discomfort.  This showed new restenosis in the left circumflex at the proximal stent margin, there was also progression of disease in the PDA.  Both side were treated with DES.  He was enrolled in  [place] 4 trial.  Based on the last note in February 2020, he did not wish to continue in [place] 4 trial due to myalgia after injection.  He was last seen by Dr. [name] on 08/22/2018 at which time he was doing well.  Patient presented to the office for cardiology office evaluation on 05/30/19.  He started having recurrent exertional chest tightness and dyspnea since August 7th.  He did not notice the symptom at rest.  Since then, he also noted his blood pressure had  been drifting higher into the 160s despite previously was very well controlled.  He said his symptoms were reminiscent of the previous angina. The case was discussed with Dr. [name], planned to proceed with cardiac catheterization.   Hospital Course     Underwent cardiac cath noted above with single vessel CAD with 90% in the mLCx distal to the prior stents into the OM1. PCI/DES to the mLCX/OM1 in an overlapping fashion. Plan for DAPT with ASA/plavix for at least one year. Seen by cardiac rehab while in short stay. No complications post cath. Femoral site stable. Instructions/precautions regarding cath site care given prior to discharge. Referral sent to the Lipid clinic at discharge as patient reports having been on multiple statins and intolerant 2/2 to myalgias.    [name] was seen by Dr. [name] and determined stable for discharge home. Follow up in the office has been arranged. Medications are listed below.   _____________  Discharge Vitals Blood pressure 137/86, pulse 65, temperature 98.1 F (36.7 C), temperature source Oral, resp. rate 11, height 5\' 7"  (1.702 m), weight 82.6 kg, SpO2 99 %.  Filed Weights   06/05/19 0818  Weight: 82.6 kg    Labs & Radiologic Studies    CBC No results for input(s): WBC, NEUTROABS, HGB, HCT, MCV, PLT in the last 72 hours. Basic Metabolic Panel No results for input(s): NA, K, CL, CO2, GLUCOSE, BUN, CREATININE, CALCIUM, MG, PHOS in the last 72 hours. Liver Function Tests No results for input(s): AST, ALT, ALKPHOS, BILITOT, PROT, ALBUMIN in the last 72 hours. No results for input(s): LIPASE, AMYLASE in the last 72 hours. Cardiac Enzymes No results for input(s): CKTOTAL, CKMB, CKMBINDEX, TROPONINI in the last 72 hours. BNP Invalid input(s): POCBNP D-Dimer No results for input(s): DDIMER in the last 72 hours. Hemoglobin A1C No results for input(s): HGBA1C in the last 72 hours. Fasting Lipid Panel No results for input(s): CHOL, HDL, LDLCALC,  TRIG, CHOLHDL, LDLDIRECT in the last 72 hours. Thyroid Function Tests No results for input(s): TSH, T4TOTAL, T3FREE, THYROIDAB in the last 72 hours.  Invalid input(s): FREET3 _____________  No results found. Disposition   Pt is being discharged home today in good condition.  Follow-up Plans & Appointments    Follow-up Information    [name], Peter M, MD Follow up on 06/11/2019.   Specialty: Cardiology Why: at 2:40am for your follow up appt.  Contact information: [address] [place] [address] [phone]          Discharge Instructions    AMB Referral to Advanced Lipid Disorders Clinic   Complete by: As directed    Internal Lipid Clinic Referral Scheduling  Internal lipid clinic referrals are providers within [facility], who wish to refer established patients for routine management (help in starting PCSK9 inhibitor therapy) or advanced therapies.  Internal MD referral criteria:              1. All patients with LDL>190 mg/dL  2. All  patients with Triglycerides >500 mg/dL  3. Patients with suspected or confirmed heterozygous familial hyperlipidemia (HeFH) or homozygous familial hyperlipidemia (HoFH)  4. Patients with family history of suspicious for genetic dyslipidemia desiring genetic testing  5. Patients refractory to standard guideline based therapy  6. Patients with statin intolerance (failed 2 statins, one of which must be a high potency statin)  7. Patients who the provider desires to be seen by MD   Internal PharmD referral criteria:   1. Follow-up patients for medication management  2. Follow-up for compliance monitoring  3. Patients for drug education  4. Patients with statin intolerance  5. PCSK9 inhibitor education and prior authorization approvals  6. Patients with triglycerides <500 mg/dL  External Lipid Clinic Referral  External lipid clinic referrals are for providers outside of [facility], considered new clinic patients -  automatically routed to MD schedule   Amb Referral to Cardiac Rehabilitation   Complete by: As directed    Diagnosis: Coronary Stents   After initial evaluation and assessments completed: Virtual Based Care may be provided alone or in conjunction with Phase 2 Cardiac Rehab based on patient barriers.: Yes       Discharge Medications     Medication List    TAKE these medications   amLODipine 5 MG tablet Commonly known as: NORVASC Take 0.5 tablets (2.5 mg total) by mouth daily.   aspirin 81 MG tablet Take 81 mg by mouth daily. What changed: Another medication with the same name was removed. Continue taking this medication, and follow the directions you see here.   clopidogrel 75 MG tablet Commonly known as: PLAVIX Take 1 tablet (75 mg total) by mouth daily.   Co Q-10 100 MG Caps Take 100 mg by mouth daily.   losartan 25 MG tablet Commonly known as: COZAAR Take 1 tablet (25 mg total) by mouth daily.   metoprolol tartrate 25 MG tablet Commonly known as: LOPRESSOR Take 0.5 tablets (12.5 mg total) by mouth every morning AND 1 tablet (25 mg total) every evening.   multivitamin with minerals Tabs tablet Take 1 tablet by mouth daily.   nitroGLYCERIN 0.4 MG SL tablet Commonly known as: NITROSTAT Place 1 tablet (0.4 mg total) under the tongue every 5 (five) minutes as needed for chest pain. What changed: when to take this        Acute coronary syndrome (MI, NSTEMI, STEMI, etc) this admission?: No.     Outstanding Labs/Studies   N/a   Duration of Discharge Encounter   Greater than 30 minutes including physician time.  Signed, Reino Bellis NP-C 06/05/2019, 2:18 PM

## 2019-06-05 NOTE — Progress Notes (Signed)
[id number] Education completed with pt who voiced understanding. Stressed importance of plavix with stent. Reviewed NTG use, heart healthy food choices and walking for ex until he sees cardiologist. Pt does a lot of exercises with weights and twisting. Encouraged him to only do walking until he goes for follow up appt as he needs to let groin heal. Referred to [facility] but pt does not want to attend as he has his own ex and walking routine. Has flip phone so no referral for virtual App. Pt has been watching his diet and trying to adhere to heart healthy foods. Graylon Good RN BSN 06/05/2019 2:01 PM

## 2019-06-05 NOTE — Interval H&P Note (Signed)
History and Physical Interval Note:  06/05/2019 10:00 AM  [name]  has presented today for surgery, with the diagnosis of Angina.  The various methods of treatment have been discussed with the patient and family. After consideration of risks, benefits and other options for treatment, the patient has consented to  Procedure(s): LEFT HEART CATH AND CORONARY ANGIOGRAPHY (N/A) as a surgical intervention.  The patient's history has been reviewed, patient examined, no change in status, stable for surgery.  I have reviewed the patient's chart and labs.  Questions were answered to the patient's satisfaction.    Cath Lab Visit (complete for each Cath Lab visit)  Clinical Evaluation Leading to the Procedure:   ACS: No.  Non-ACS:    Anginal Classification: CCS III  Anti-ischemic medical therapy: Maximal Therapy (2 or more classes of medications)  Non-Invasive Test Results: No non-invasive testing performed  Prior CABG: No previous CABG       [facility] 06/05/2019 10:01 AM

## 2019-06-05 NOTE — Discharge Instructions (Signed)
Femoral Site Care °This sheet gives you information about how to care for yourself after your procedure. Your health care provider may also give you more specific instructions. If you have problems or questions, contact your health care provider. °What can I expect after the procedure? °After the procedure, it is common to have: °· Bruising that usually fades within 1-2 weeks. °· Tenderness at the site. °Follow these instructions at home: °Wound care °· Follow instructions from your health care provider about how to take care of your insertion site. Make sure you: °? Wash your hands with soap and water before you change your bandage (dressing). If soap and water are not available, use hand sanitizer. °? Change your dressing as told by your health care provider. °? Leave stitches (sutures), skin glue, or adhesive strips in place. These skin closures may need to stay in place for 2 weeks or longer. If adhesive strip edges start to loosen and curl up, you may trim the loose edges. Do not remove adhesive strips completely unless your health care provider tells you to do that. °· Do not take baths, swim, or use a hot tub until your health care provider approves. °· You may shower 24-48 hours after the procedure or as told by your health care provider. °? Gently wash the site with plain soap and water. °? Pat the area dry with a clean towel. °? Do not rub the site. This may cause bleeding. °· Do not apply powder or lotion to the site. Keep the site clean and dry. °· Check your femoral site every day for signs of infection. Check for: °? Redness, swelling, or pain. °? Fluid or blood. °? Warmth. °? Pus or a bad smell. °Activity °· For the first 2-3 days after your procedure, or as long as directed: °? Avoid climbing stairs as much as possible. °? Do not squat. °· Do not lift anything that is heavier than 10 lb (4.5 kg), or the limit that you are told, until your health care provider says that it is safe. °· Rest as  directed. °? Avoid sitting for a long time without moving. Get up to take short walks every 1-2 hours. °· Do not drive for 24 hours if you were given a medicine to help you relax (sedative). °General instructions °· Take over-the-counter and prescription medicines only as told by your health care provider. °· Keep all follow-up visits as told by your health care provider. This is important. °Contact a health care provider if you have: °· A fever or chills. °· You have redness, swelling, or pain around your insertion site. °Get help right away if: °· The catheter insertion area swells very fast. °· You pass out. °· You suddenly start to sweat or your skin gets clammy. °· The catheter insertion area is bleeding, and the bleeding does not stop when you hold steady pressure on the area. °· The area near or just beyond the catheter insertion site becomes pale, cool, tingly, or numb. °These symptoms may represent a serious problem that is an emergency. Do not wait to see if the symptoms will go away. Get medical help right away. Call your local emergency services (911 in the [place] Do not drive yourself to the hospital. °Summary °· After the procedure, it is common to have bruising that usually fades within 1-2 weeks. °· Check your femoral site every day for signs of infection. °· Do not lift anything that is heavier than 10 lb (4.5 kg), or the   limit that you are told, until your health care provider says that it is safe. °This information is not intended to replace advice given to you by your health care provider. Make sure you discuss any questions you have with your health care provider. °Document Released: 05/16/2014 Document Revised: 09/25/2017 Document Reviewed: 09/25/2017 °Elsevier Patient Education © 2020 [employer] ° °

## 2019-06-06 ENCOUNTER — Inpatient Hospital Stay (HOSPITAL_COMMUNITY)
Admission: EM | Admit: 2019-06-06 | Discharge: 2019-06-08 | DRG: 247 | Disposition: A | Discharge status: Home / Self Care | Payer: PPO | Attending: Cardiology | Admitting: Cardiology

## 2019-06-06 ENCOUNTER — Other Ambulatory Visit: Payer: Self-pay

## 2019-06-06 ENCOUNTER — Encounter (HOSPITAL_COMMUNITY): Payer: Self-pay | Admitting: Cardiology

## 2019-06-06 ENCOUNTER — Emergency Department (HOSPITAL_COMMUNITY): Payer: PPO

## 2019-06-06 ENCOUNTER — Telehealth: Payer: Self-pay | Admitting: Cardiology

## 2019-06-06 DIAGNOSIS — Z806 Family history of leukemia: Secondary | ICD-10-CM | POA: Diagnosis not present

## 2019-06-06 DIAGNOSIS — Z86718 Personal history of other venous thrombosis and embolism: Secondary | ICD-10-CM

## 2019-06-06 DIAGNOSIS — Z91048 Other nonmedicinal substance allergy status: Secondary | ICD-10-CM | POA: Diagnosis not present

## 2019-06-06 DIAGNOSIS — E782 Mixed hyperlipidemia: Secondary | ICD-10-CM | POA: Diagnosis present

## 2019-06-06 DIAGNOSIS — Z955 Presence of coronary angioplasty implant and graft: Secondary | ICD-10-CM | POA: Diagnosis not present

## 2019-06-06 DIAGNOSIS — Z20828 Contact with and (suspected) exposure to other viral communicable diseases: Secondary | ICD-10-CM | POA: Diagnosis present

## 2019-06-06 DIAGNOSIS — T82855A Stenosis of coronary artery stent, initial encounter: Secondary | ICD-10-CM | POA: Diagnosis not present

## 2019-06-06 DIAGNOSIS — Z79899 Other long term (current) drug therapy: Secondary | ICD-10-CM

## 2019-06-06 DIAGNOSIS — Y831 Surgical operation with implant of artificial internal device as the cause of abnormal reaction of the patient, or of later complication, without mention of misadventure at the time of the procedure: Secondary | ICD-10-CM | POA: Diagnosis not present

## 2019-06-06 DIAGNOSIS — Z885 Allergy status to narcotic agent status: Secondary | ICD-10-CM | POA: Diagnosis not present

## 2019-06-06 DIAGNOSIS — I200 Unstable angina: Secondary | ICD-10-CM

## 2019-06-06 DIAGNOSIS — R072 Precordial pain: Secondary | ICD-10-CM | POA: Diagnosis present

## 2019-06-06 DIAGNOSIS — Z87891 Personal history of nicotine dependence: Secondary | ICD-10-CM

## 2019-06-06 DIAGNOSIS — I2510 Atherosclerotic heart disease of native coronary artery without angina pectoris: Secondary | ICD-10-CM

## 2019-06-06 DIAGNOSIS — I25110 Atherosclerotic heart disease of native coronary artery with unstable angina pectoris: Secondary | ICD-10-CM | POA: Diagnosis present

## 2019-06-06 DIAGNOSIS — Z8546 Personal history of malignant neoplasm of prostate: Secondary | ICD-10-CM | POA: Diagnosis not present

## 2019-06-06 DIAGNOSIS — I252 Old myocardial infarction: Secondary | ICD-10-CM | POA: Diagnosis not present

## 2019-06-06 DIAGNOSIS — Z888 Allergy status to other drugs, medicaments and biological substances status: Secondary | ICD-10-CM | POA: Diagnosis not present

## 2019-06-06 DIAGNOSIS — Z7902 Long term (current) use of antithrombotics/antiplatelets: Secondary | ICD-10-CM | POA: Diagnosis not present

## 2019-06-06 DIAGNOSIS — I739 Peripheral vascular disease, unspecified: Secondary | ICD-10-CM | POA: Diagnosis present

## 2019-06-06 DIAGNOSIS — Z9079 Acquired absence of other genital organ(s): Secondary | ICD-10-CM | POA: Diagnosis not present

## 2019-06-06 DIAGNOSIS — Z8249 Family history of ischemic heart disease and other diseases of the circulatory system: Secondary | ICD-10-CM

## 2019-06-06 DIAGNOSIS — Z9861 Coronary angioplasty status: Secondary | ICD-10-CM

## 2019-06-06 DIAGNOSIS — I10 Essential (primary) hypertension: Secondary | ICD-10-CM | POA: Diagnosis present

## 2019-06-06 DIAGNOSIS — Z7982 Long term (current) use of aspirin: Secondary | ICD-10-CM | POA: Diagnosis not present

## 2019-06-06 LAB — BASIC METABOLIC PANEL
Anion gap: 10 (ref 5–15)
BUN: 12 mg/dL (ref 8–23)
CO2: 25 mmol/L (ref 22–32)
Calcium: 9.1 mg/dL (ref 8.9–10.3)
Chloride: 106 mmol/L (ref 98–111)
Creatinine, Ser: 0.81 mg/dL (ref 0.61–1.24)
GFR calc Af Amer: >60 mL/min (ref >60)
GFR calc non Af Amer: >60 mL/min (ref >60)
Glucose, Bld: 96 mg/dL (ref 70–99)
Potassium: 4.3 mmol/L (ref 3.5–5.1)
Sodium: 141 mmol/L (ref 135–145)

## 2019-06-06 LAB — TROPONIN I (HIGH SENSITIVITY)
Troponin I (High Sensitivity): 30 ng/L — ABNORMAL HIGH (ref <18)
Troponin I (High Sensitivity): 26 ng/L — ABNORMAL HIGH (ref <18)

## 2019-06-06 LAB — CBC
HCT: 49.4 % (ref 39.0–52.0)
Hemoglobin: 16.6 g/dL (ref 13.0–17.0)
MCH: 31.3 pg (ref 26.0–34.0)
MCHC: 33.6 g/dL (ref 30.0–36.0)
MCV: 93 fL (ref 80.0–100.0)
Platelets: 251 10*3/uL (ref 150–400)
RBC: 5.31 MIL/uL (ref 4.22–5.81)
RDW: 12.5 % (ref 11.5–15.5)
WBC: 7.2 10*3/uL (ref 4.0–10.5)
nRBC: 0 % (ref 0.0–0.2)

## 2019-06-06 LAB — SARS CORONAVIRUS 2 BY RT PCR (HOSPITAL ORDER, PERFORMED IN ~~LOC~~ HOSPITAL LAB): SARS Coronavirus 2: NEGATIVE

## 2019-06-06 MED ORDER — SODIUM CHLORIDE 0.9 % WEIGHT BASED INFUSION
1.0000 mL/kg/h | INTRAVENOUS | Status: DC
  Administered 2019-06-07: 1 mL/kg/h via INTRAVENOUS

## 2019-06-06 MED ORDER — METOPROLOL TARTRATE 25 MG PO TABS
25.0000 mg | ORAL_TABLET | Freq: Every day | ORAL | Status: DC
  Administered 2019-06-06 – 2019-06-07 (×2): 25 mg via ORAL
  Filled 2019-06-06 (×2): qty 1

## 2019-06-06 MED ORDER — SODIUM CHLORIDE 0.9 % IV SOLN: 250.0000 mL | INTRAVENOUS | Status: DC | PRN

## 2019-06-06 MED ORDER — NITROGLYCERIN IN D5W 200-5 MCG/ML-% IV SOLN
0.0000 ug/min | INTRAVENOUS | Status: DC
  Administered 2019-06-06: 5 ug/min via INTRAVENOUS
  Administered 2019-06-07: 30 ug/min via INTRAVENOUS
  Filled 2019-06-06: qty 250

## 2019-06-06 MED ORDER — CLOPIDOGREL BISULFATE 75 MG PO TABS
75.0000 mg | ORAL_TABLET | Freq: Every day | ORAL | Status: DC
  Administered 2019-06-08: 75 mg via ORAL
  Filled 2019-06-06: qty 1

## 2019-06-06 MED ORDER — MORPHINE SULFATE (PF) 2 MG/ML IV SOLN
2.0000 mg | INTRAVENOUS | Status: DC | PRN
  Administered 2019-06-06 – 2019-06-07 (×2): 2 mg via INTRAVENOUS
  Filled 2019-06-06: qty 2

## 2019-06-06 MED ORDER — SODIUM CHLORIDE 0.9% FLUSH: 3.0000 mL | INTRAVENOUS | Status: DC | PRN

## 2019-06-06 MED ORDER — SODIUM CHLORIDE 0.9% FLUSH: 3.0000 mL | Freq: Once | INTRAVENOUS | Status: DC

## 2019-06-06 MED ORDER — LOSARTAN POTASSIUM 25 MG PO TABS
25.0000 mg | ORAL_TABLET | Freq: Every day | ORAL | Status: DC
  Administered 2019-06-07 – 2019-06-08 (×2): 25 mg via ORAL
  Filled 2019-06-06 (×2): qty 1

## 2019-06-06 MED ORDER — HEPARIN BOLUS VIA INFUSION
4000.0000 [IU] | Freq: Once | INTRAVENOUS | Status: AC
  Administered 2019-06-06: 4000 [IU] via INTRAVENOUS
  Filled 2019-06-06: qty 4000

## 2019-06-06 MED ORDER — ONDANSETRON HCL 4 MG/2ML IJ SOLN: 4.0000 mg | Freq: Four times a day (QID) | INTRAMUSCULAR | Status: DC | PRN

## 2019-06-06 MED ORDER — ASPIRIN 81 MG PO CHEW
81.0000 mg | CHEWABLE_TABLET | ORAL | Status: AC
  Administered 2019-06-07: 81 mg via ORAL
  Filled 2019-06-06: qty 1

## 2019-06-06 MED ORDER — HEPARIN (PORCINE) 25000 UT/250ML-% IV SOLN
1000.0000 [IU]/h | INTRAVENOUS | Status: DC
  Administered 2019-06-06 – 2019-06-07 (×2): 1000 [IU]/h via INTRAVENOUS
  Filled 2019-06-06: qty 250

## 2019-06-06 MED ORDER — ASPIRIN 81 MG PO CHEW
81.0000 mg | CHEWABLE_TABLET | Freq: Every day | ORAL | Status: DC
  Administered 2019-06-08: 81 mg via ORAL
  Filled 2019-06-06: qty 1

## 2019-06-06 MED ORDER — SODIUM CHLORIDE 0.9% FLUSH: 3.0000 mL | Freq: Two times a day (BID) | INTRAVENOUS | Status: DC

## 2019-06-06 MED ORDER — ALPRAZOLAM 0.25 MG PO TABS: 0.2500 mg | ORAL_TABLET | Freq: Two times a day (BID) | ORAL | Status: DC | PRN

## 2019-06-06 MED ORDER — SODIUM CHLORIDE 0.9% FLUSH
3.0000 mL | Freq: Two times a day (BID) | INTRAVENOUS | Status: DC
  Administered 2019-06-07: 3 mL via INTRAVENOUS

## 2019-06-06 MED ORDER — AMLODIPINE BESYLATE 10 MG PO TABS
10.0000 mg | ORAL_TABLET | Freq: Every day | ORAL | Status: DC
  Administered 2019-06-06 – 2019-06-08 (×3): 10 mg via ORAL
  Filled 2019-06-06: qty 1
  Filled 2019-06-06: qty 2
  Filled 2019-06-06: qty 1

## 2019-06-06 MED ORDER — NITROGLYCERIN 0.4 MG SL SUBL: 0.4000 mg | SUBLINGUAL_TABLET | SUBLINGUAL | Status: DC | PRN

## 2019-06-06 MED ORDER — ZOLPIDEM TARTRATE 5 MG PO TABS: 5.0000 mg | ORAL_TABLET | Freq: Every evening | ORAL | Status: DC | PRN

## 2019-06-06 MED ORDER — CO Q-10 100 MG PO CAPS: 100.0000 mg | ORAL_CAPSULE | Freq: Every day | ORAL | Status: DC

## 2019-06-06 MED ORDER — AMLODIPINE BESYLATE 10 MG PO TABS: 10.0000 mg | ORAL_TABLET | Freq: Every day | ORAL | 3 refills | Status: DC

## 2019-06-06 MED ORDER — METOPROLOL TARTRATE 12.5 MG HALF TABLET
12.5000 mg | ORAL_TABLET | Freq: Every day | ORAL | Status: DC
  Administered 2019-06-08: 12.5 mg via ORAL
  Filled 2019-06-06: qty 1

## 2019-06-06 MED ORDER — ASPIRIN 81 MG PO CHEW
243.0000 mg | CHEWABLE_TABLET | Freq: Once | ORAL | Status: AC
  Administered 2019-06-06: 20:00:00 243 mg via ORAL
  Filled 2019-06-06: qty 3

## 2019-06-06 MED ORDER — ACETAMINOPHEN 325 MG PO TABS
650.0000 mg | ORAL_TABLET | ORAL | Status: DC | PRN
  Administered 2019-06-07: 650 mg via ORAL
  Filled 2019-06-06: qty 2

## 2019-06-06 MED ORDER — ADULT MULTIVITAMIN W/MINERALS CH
1.0000 | ORAL_TABLET | Freq: Every day | ORAL | Status: DC
  Administered 2019-06-08: 1 via ORAL
  Filled 2019-06-06: qty 1

## 2019-06-06 MED ORDER — SODIUM CHLORIDE 0.9 % WEIGHT BASED INFUSION
3.0000 mL/kg/h | INTRAVENOUS | Status: DC
  Administered 2019-06-07: 04:00:00 3 mL/kg/h via INTRAVENOUS

## 2019-06-06 MED FILL — Nitroglycerin IV Soln 100 MCG/ML in D5W: INTRA_ARTERIAL | Qty: 10 | Status: AC

## 2019-06-06 NOTE — ED Notes (Signed)
Patient transported to X-ray 

## 2019-06-06 NOTE — Telephone Encounter (Signed)
He should increase his amlodipine to 5 mg for his elevated BP which may be contributing to his pain.  Antwonette Feliz [name] MD, [facility]

## 2019-06-06 NOTE — H&P (Addendum)
Cardiology Admission History and Physical:   Patient ID: [name]; MRN: [id number]; DOB: June 06, 1951   Admission date: 06/06/2019  Primary Care Provider: [name], MD Primary Cardiologist: Peter [name], MD  Primary Electrophysiologist:  None  Chief Complaint:  Chest pain  Patient Profile:   [name] is a 68 y.o. male with a history of TRUST LEH a 69 y.o.malewith PMH of CAD, HTN, HLD (intolerant of statins), sinus bradycardia,history of provoked DVT, PVCs on previous 48-hour monitor, mild carotid artery disease and prostate cancer.Previous cardiac catheterization performed in the setting of an STEMI on 09/21/2017 showed patent LAD stent with 50% mid LAD lesion, 60% mid left circumflex lesion, and 60% PDA lesion. PDA lesion is unchanged from 2012. Left circumflex lesion was significant by FFR and he underwent PCI with DES. Later that night he had recurrent chest pain and was taken back to the Cath Lab. Cardiac catheterization revealed patent left circumflex stent with dissection into the OM 3. Medical therapy was planned as any reattempt with crossing with a wire could lead to worsening dissection. Due to persistent symptoms, he underwent repeat cardiac cath on 01/11/2018, that showed widely patent stents, the wire dissection had healed completely, EF was normal at the time. He underwent repeat cardiac cath on 08/03/2018 due to exertional chest discomfort. This showed new restenosis in the left circumflex at the proximal stent margin, there was also progression of disease in the PDA. Both side were treated with DES. He was enrolled in [facility]. Based on the last note in February 2020, he did not wish to continue in [place] 4 trialdue to myalgia after injection.  He was last seen by Dr. [name] on 08/22/2018 at which time he was doing well. Patient presented to the office for cardiology office evaluation on 05/30/19. He started having recurrent exertional chest  tightness and dyspnea since August 7th. He did not notice the symptom at rest. Since then, he also noted his blood pressure had been drifting higher into the 160s despite previously was very well controlled. He said his symptoms were reminiscent of the previous angina. The case was discussed with Dr. [name], planned to proceed with cardiac catheterization.   History of Present Illness:   [name] was admitted on 9/9 for cardiac catheterization.  Cardiac catheterization results are below.  He had a drug-eluting stent to the circumflex and he tolerated the procedure well.  He was eligible for same-day discharge.  There was also concern that hypertension was contributing to his chest pain, and phone notes, he was told to increase his amlodipine from 2.5 mg to 5 mg daily.  He contacted the office because of recurrent chest pain today and was told to come to the emergency room.   When he went home from the hospital, he was having chest pressure, pain. It was going on when he left the cath lab. It was 5/10 then, 2/10 when he left. It got worse after supper, again a 5/10. He took a nitro, the pain went to a 2/10 and he was able to sleep.  He woke at 2:10 am, was having 3-4/10 chest pain, thinks that woke him. Also felt his R leg was cold, but not numb. Was not able to get back to sleep. Took am meds, pain did not resolve. About noon, started taking SL NTG x 3 total. That improved the pain, but did not resolve it.   Called the office, was supposed to increase his amlodipine, but has not done  so yet, he takes it at night.   SBP was 175/100 when he was having the chest pain, extremely high for him.  He did not get N&V with the pain, did get SOB.  Right now, he is having chest pressure 3/10.   Past Medical History:  Diagnosis Date   Abnormality of aortic valve    a. possibly bicuspid.   Asthma    Childhood   Carotid artery disease ([place])    a. 1-39% bilaterally in 2017.   Coronary artery  disease    a. anterior STEMI 04/2011, s/p DES to LAD.   DVT (deep venous thrombosis) (HCC)    a. tx with Xarelto x 5 months, stopped due to hematuria.   Frequent PVCs    a. seen on Holter monitor.   Hyperlipidemia, mixed    Hypertension    Myocardial infarction ([place]) 05/21/2011   anterior wall, treated by Dr. [name] with 3.0x56m Promus drug eluting stent to proximal LAD    [place]    moderate left ICA disease, mild right ICA disease   [place] ([place]   Sinus bradycardia    a. HR 50s at home.   Statin intolerance    [place] 08/03/2018    Past Surgical History:  Procedure Laterality Date   CARDIAC CATHETERIZATION  05/23/2011, 05/21/2011   05/21/2011   3.0 x 269mPromus Element drug eluting stent positioned in proximal LAD, stenosis taken from 99% to 0%.   CORONARY STENT INTERVENTION N/A 09/21/2017   Procedure: CORONARY STENT INTERVENTION;  Surgeon: BeLorretta HarpMD;  [facility];  Service: Cardiovascular;  Laterality: N/A;   CORONARY STENT INTERVENTION N/A 08/03/2018   Procedure: CORONARY STENT INTERVENTION;  Surgeon: JoMartiniquePeter M, MD;  [facility];  Service: Cardiovascular;  Laterality: N/A;   CORONARY STENT INTERVENTION N/A 06/05/2019   Procedure: CORONARY STENT INTERVENTION;  Surgeon: JoMartiniquePeter M, MD;  [facility];  Service: Cardiovascular;  Laterality: N/A;   CORONARY/GRAFT ACUTE MI REVASCULARIZATION N/A 09/21/2017   Procedure: Coronary/Graft Acute MI Revascularization;  Surgeon: JoMartiniquePeter M, MD;  [facility];  Service: Cardiovascular;  Laterality: N/A;   HERNIA REPAIR     INTRAVASCULAR PRESSURE WIRE/FFR STUDY N/A 09/21/2017   Procedure: INTRAVASCULAR PRESSURE WIRE/FFR STUDY;  Surgeon: BeLorretta HarpMD;  [facility];  Service: Cardiovascular;  Laterality: N/A;   LEFT HEART CATH AND CORONARY ANGIOGRAPHY  N/A 09/21/2017   Procedure: LEFT HEART CATH AND CORONARY ANGIOGRAPHY;  Surgeon: JoMartiniquePeter M, MD;  [facility];  Service: Cardiovascular;  Laterality: N/A;   LEFT HEART CATH AND CORONARY ANGIOGRAPHY N/A 09/21/2017   Procedure: LEFT HEART CATH AND CORONARY ANGIOGRAPHY;  Surgeon: BeLorretta HarpMD;  [facility];  Service: Cardiovascular;  Laterality: N/A;   LEFT HEART CATH AND CORONARY ANGIOGRAPHY N/A 01/11/2018   Procedure: LEFT HEART CATH AND CORONARY ANGIOGRAPHY;  Surgeon: JoMartiniquePeter M, MD;  [facility];  Service: Cardiovascular;  Laterality: N/A;   LEFT HEART CATH AND CORONARY ANGIOGRAPHY N/A 08/03/2018   Procedure: LEFT HEART CATH AND CORONARY ANGIOGRAPHY;  Surgeon: JoMartiniquePeter M, MD;  [facility];  Service: Cardiovascular;  Laterality: N/A;   LEFT HEART CATH AND CORONARY ANGIOGRAPHY N/A 06/05/2019   Procedure: LEFT HEART CATH AND CORONARY ANGIOGRAPHY;  Surgeon: JoMartiniquePeter M, MD;  [facility];  Service: Cardiovascular;  Laterality: N/A;   PROSTATECTOMY  2007     Medications Prior to Admission: Prior to Admission medications   Medication Sig Start Date End Date Taking? Authorizing Provider  amLODipine (NORVASC) 10 MG tablet Take 1 tablet (10 mg total) by mouth daily. 06/06/19  Yes [name], Peter M, MD  aspirin 81 MG tablet Take 81 mg by mouth daily.    Yes [provider]  clopidogrel (PLAVIX) 75 MG tablet Take 1 tablet (75 mg total) by mouth daily. 05/30/19  Yes Almyra Deforest, PA  Coenzyme Q10 (CO Q-10) 100 MG CAPS Take 100 mg by mouth daily.    Yes [provider]  losartan (COZAAR) 25 MG tablet Take 1 tablet (25 mg total) by mouth daily. 05/30/19  Yes Almyra Deforest, PA  metoprolol tartrate (LOPRESSOR) 25 MG tablet Take 0.5 tablets (12.5 mg total) by mouth every morning AND 1 tablet (25 mg total) every evening. Patient taking differently: Take 0.5 tablets (12.5 mg total) by mouth every  morning then take 1 tablet (25 mg total) every evening. 05/30/19  Yes Almyra Deforest, PA  Multiple Vitamin (MULTIVITAMIN WITH MINERALS) TABS tablet Take 1 tablet by mouth daily.   Yes [provider]  nitroGLYCERIN (NITROSTAT) 0.4 MG SL tablet Place 1 tablet (0.4 mg total) under the tongue every 5 (five) minutes as needed for chest pain. Patient taking differently: Place 0.4 mg under the tongue every 5 (five) minutes x 3 doses as needed for chest pain.  05/30/19  Yes Almyra Deforest, PA     Allergies:    Allergies  Allergen Reactions   Adhesive [Tape] Hives, Itching and Other (See Comments)    Hives  And itching from the adhesive on the pads of the heart monitor    Demerol [Meperidine] Nausea Only and Other (See Comments)    Drop in blood pressure, Muscle spasm   Fentanyl Nausea Only and Other (See Comments)    Drop in blood pressure, Muscle spasm   Statins Other (See Comments)    Myalgias     Social History:   Social History   Socioeconomic History   Marital status: Married    Spouse name: Not on file   Number of children: 0   Years of education: Not on file   Highest education level: Not on file  Occupational History   Not on file  Social Needs   Financial resource strain: Not on file   Food insecurity    Worry: Not on file    Inability: Not on file   Transportation needs    Medical: Not on file    Non-medical: Not on file  Tobacco Use   Smoking status: Former Smoker    Packs/day: 1.00    Years: 7.00    Pack years: 7.00    Types: Cigarettes    Start date: 03/05/1970    Quit date: 09/26/1976    Years since quitting: 42.7   Smokeless tobacco: Never Used  Substance and Sexual Activity   Alcohol use: No    Alcohol/week: 0.0 standard drinks   Drug use: No   Sexual activity: Never  Lifestyle   Physical activity    Days per week: Not on file    Minutes per session: Not on file   Stress: Not on file  Relationships   Social connections    Talks on  phone: Not on file    Gets together: Not on file    Attends religious service: Not on file  Active member of club or organization: Not on file    Attends meetings of clubs or organizations: Not on file    Relationship status: Not on file   Intimate partner violence    Fear of current or ex partner: Not on file    Emotionally abused: Not on file    Physically abused: Not on file    Forced sexual activity: Not on file  Other Topics Concern   Not on file  Social History Narrative   Not on file    Family History:   The patient's family history includes Heart failure in his father; Leukemia in his mother.   The patient He indicated that his mother is deceased. He indicated that his father is deceased. He indicated that both of his brothers are alive. He indicated that his maternal grandmother is deceased. He indicated that his maternal grandfather is deceased. He indicated that his paternal grandmother is deceased. He indicated that his paternal grandfather is deceased.    ROS:  Please see the history of present illness.  All other ROS reviewed and negative.     Physical Exam/Data:   Vitals:   06/06/19 1830 06/06/19 1845 06/06/19 1900 06/06/19 1930  BP: (!) 142/87 (!) 153/82 (!) 146/94 (!) 166/90  Pulse: 65 63 63 65  Resp: '15 15 19 12  ' Temp:      TempSrc:      SpO2: 95% 96% 95% 99%   No intake or output data in the 24 hours ending 06/06/19 1946 There were no vitals filed for this visit. There is no height or weight on file to calculate BMI.  General:  Well nourished, well developed, in no acute distress HEENT: normal Lymph: no adenopathy Neck:  JVD not elevated Endocrine:  No thryomegaly Vascular: No carotid bruits; 4/4 extremity pulses 2+ bilaterally Cardiac:  normal S1, S2; RRR; no murmur, no rub or gallop  Lungs:  clear to auscultation bilaterally, no wheezing, rhonchi or rales  Abd: soft, nontender, no hepatomegaly  Ext: No edema; right groin cath site is without  ecchymosis or hematoma, no bruit appreciated. Musculoskeletal:  No deformities, BUE and BLE strength normal and equal Skin: warm and dry  Neuro:  CNs 2-12 intact, no focal abnormalities noted Psych:  Normal affect    EKG:  The ECG that was done 9/10 was personally reviewed and demonstrates sinus rhythm, heart rate 64, no acute ischemic changes  Relevant CV Studies:   Cath: 06/05/19   Prox LAD lesion is 45% stenosed.  Ost LAD to Prox LAD lesion is 40% stenosed.  Non-stenotic Prox Cx-2 lesion was previously treated.  Previously placed Prox Cx-1 drug eluting stent is widely patent.  Prox RCA lesion is 25% stenosed.  Previously placed Ost RPDA to RPDA drug eluting stent is widely patent.  RPAV lesion is 30% stenosed.  Mid Cx lesion is 90% stenosed.  A drug-eluting stent was successfully placed using a STENT RESOLUTE ONYX 2.5X15.  Post intervention, there is a 0% residual stenosis.  The left ventricular systolic function is normal.  LV end diastolic pressure is normal.  The left ventricular ejection fraction is 55-65% by visual estimate.  1. Single vessel obstructive CAD. 90% stenosis in the mid LCx distal to prior stents into the OM1 2. Continued stent patency in the LAD, PDA and proximal to mid LCx 3. Normal LV function 4. Normal LVEDP 5. Successful PCI of the mid LCx/OM1 with DES x 1 in overlapping fashion  Plan: DAPT indefinitely. He is  a candidate for same day discharge. Needs referral to lipid clinic to address his hypercholesterolemia and intolerance to statins. With aggressive CAD target LDL <50.   Diagnostic Dominance: Right  Intervention     Laboratory Data:  Chemistry Recent Labs  Lab 06/06/19 1533  NA 141  K 4.3  CL 106  CO2 25  GLUCOSE 96  BUN 12  CREATININE 0.81  CALCIUM 9.1  GFRNONAA >60  GFRAA >60  ANIONGAP 10    No results for input(s): PROT, ALBUMIN, AST, ALT, ALKPHOS, BILITOT in the last 168 hours. Hematology Recent Labs    Lab 06/06/19 1533  WBC 7.2  RBC 5.31  HGB 16.6  HCT 49.4  MCV 93.0  MCH 31.3  MCHC 33.6  RDW 12.5  PLT 251   Cardiac EnzymesNo results for input(s): TROPONINI in the last 168 hours. No results for input(s): TROPIPOC in the last 168 hours.  BNPNo results for input(s): BNP, PROBNP in the last 168 hours.  DDimer No results for input(s): DDIMER in the last 168 hours.  Radiology/Studies:  Dg Chest 2 View  Result Date: 06/06/2019 CLINICAL DATA:  68 year old male with a history of chest pain EXAM: CHEST - 2 VIEW COMPARISON:  December 28, 2017 FINDINGS: Cardiomediastinal silhouette unchanged in size and contour. No evidence of central vascular congestion. No pneumothorax or pleural effusion. No confluent airspace disease. No displaced fracture identified. Evidence of prior PTC I IMPRESSION: Negative for acute cardiopulmonary disease. Evidence of prior PTCI Electronically Signed   By: Corrie Mckusick D.O.   On: 06/06/2019 16:54    Assessment and Plan:   1.  Unstable anginal pain: - He has a mild elevation in his troponin, but no acute ischemic ECG changes - However, he has been having chest pain ever since he left the Cath Lab and it is nitrate responsive. - Add IV nitrates, heparin, give 3 more baby aspirin to total 324 mg today. -Use morphine for pain.  Otherwise, continue home medications   Principal Problem:   Unstable angina ([place])   For questions or updates, please contact [place] Please consult [email or web address] for contact info under Cardiology/STEMI.    Jonetta Speak, PA-C  06/06/2019 7:46 PM    Attending note:  Patient seen and examined.  I reviewed his records and discussed the case with Ms. Ahmed Prima PA-C.  He has a history of CAD status post prior percutaneous coronary interventions as detailed above.  Yesterday he underwent an outpatient cardiac catheterization with demonstration of a 90% mid circumflex stenosis distal to previously placed stent sites, and  this lesion was treated with DES overlapping the prior OM1 stent.  He was discharged home, however returns to the ER stating that he has had intermittent recurring chest discomfort since the procedure yesterday, took nitroglycerin today with improvement, but symptoms increased.  He called the office and was ultimately convinced by his significant other and PCP to come in for further evaluation.  He continues to report chest discomfort in the ER although was in no acute distress.  Blood pressure is elevated over baseline with systolics in the 951O, had been higher at home by his report as well.  Lungs are clear without liver breathing.  Cardiac exam reveals RRR without gallop or rub.  Lab work shows high-sensitivity troponin I levels of 30 and 26, potassium 4.3, BUN 12, creatinine 1.81, hemoglobin 16.6, platelets 251.  ECG reviewed and shows no acute ST segment changes.  No acute findings.  Patient presents with recurring chest  pain concerning for unstable angina, recent DES intervention to the mid circumflex yesterday overlapping prior stent in the OM1.  His ECG does not show any acute changes and high-sensitivity troponin I levels are overall nondiagnostic at this point.  Circumflex distribution can be "electrically silent" by ECG however.  Plan is admission to the hospital with initiation of heparin and IV nitroglycerin, continue to cycle enzymes.  Anticipate follow-up diagnostic cardiac catheterization tomorrow, or more urgently if his symptoms do not improve or cardiac enzymes trend significantly.  Case discussed with cardiology fellow on-call tonight.  Satira Sark, M.D., F.A.C.C.

## 2019-06-06 NOTE — Progress Notes (Signed)
ANTICOAGULATION CONSULT NOTE - Initial Consult  Pharmacy Consult for Heparin   Indication: chest pain/ACS  Allergies  Allergen Reactions  . Adhesive [Tape] Hives, Itching and Other (See Comments)    Hives  And itching from the adhesive on the pads of the heart monitor   . Demerol [Meperidine] Nausea Only and Other (See Comments)    Drop in blood pressure, Muscle spasm  . Fentanyl Nausea Only and Other (See Comments)    Drop in blood pressure, Muscle spasm  . Statins Other (See Comments)    Myalgias     Patient Measurements:   Heparin Dosing Weight: 82.6 kg   Vital Signs: Temp: 98.7 F (37.1 C) (09/10 1528) Temp Source: Oral (09/10 1528) BP: 135/91 (09/10 1745) Pulse Rate: 56 (09/10 1745)  Labs: Recent Labs    06/06/19 1533 06/06/19 1805  HGB 16.6  --   HCT 49.4  --   PLT 251  --   CREATININE 0.81  --   TROPONINIHS 30* 26*    Estimated Creatinine Clearance: 88.5 mL/min (by C-G formula based on SCr of 0.81 mg/dL).   Medical History: Past Medical History:  Diagnosis Date  . Abnormality of aortic valve    a. possibly bicuspid.  . Asthma    Childhood  . Carotid artery disease ([place])    a. 1-39% bilaterally in 2017.  [place] Coronary artery disease    a. anterior STEMI 04/2011, s/p DES to LAD.  [place] Kitchen DVT (deep venous thrombosis) (HCC)    a. tx with Xarelto x 5 months, stopped due to hematuria.  . Frequent PVCs    a. seen on Holter monitor.  . Hyperlipidemia, mixed   . Hypertension   . Myocardial infarction ([place]) 05/21/2011   anterior wall, treated by Dr. [name] with 3.0x18mm Promus drug eluting stent to proximal LAD   . Peripheral arterial disease (HCC)    moderate left ICA disease, mild right ICA disease  . Prostate cancer ([place]) 2007  . Sinus bradycardia    a. HR 50s at home.  . Statin intolerance   . Unstable angina ([place]) 08/03/2018    Medications:  Scheduled:  . heparin  4,000 Units Intravenous Once  . sodium chloride flush  3 mL Intravenous Once    Infusions:  . heparin    . nitroGLYCERIN 5 mcg/min (06/06/19 1949)    Assessment: 68 y.o. male s/p cardiac cath on 9/9 now presenting with recurrent chest pain and shortness of breath. No anticoagulation PTA. Hgb 16.6, Plts 251. Pharmacy consulted for heparin dosing.  Goal of Therapy:  Heparin level 0.3-0.7 units/ml Monitor platelets by anticoagulation protocol: Yes   Plan:  Heparin bolus 4000 units Heparin 1000 units/hr  Check 6hr HL Daily HL and CBC Monitor s/sx of bleeding   Lorel Monaco, PharmD PGY1 Ambulatory Care Resident [employer] # [phone]

## 2019-06-06 NOTE — ED Notes (Signed)
ED TO INPATIENT HANDOFF REPORT  ED Nurse Name and Phone #: William Hamburger, RN  S Name/Age/Gender [name] 68 y.o. male Room/Bed: 040C/040C  Code Status   Code Status: Prior  Home/SNF/Other Home Patient oriented to: self, place, time and situation Is this baseline? No   Triage Complete: Triage complete  Chief Complaint CP SOB  Triage Note  Reports sob and chest pain with radiation to bilateral shoulders and back that started around lunch time and got worse. States that he received a cardiac cath yesterday and had a stent placed (his 5th one) states he took 3 nitro with pain relief but pain started coming back.    Allergies Allergies  Allergen Reactions  . Adhesive [Tape] Hives, Itching and Other (See Comments)    Hives  And itching from the adhesive on the pads of the heart monitor   . Demerol [Meperidine] Nausea Only and Other (See Comments)    Drop in blood pressure, Muscle spasm  . Fentanyl Nausea Only and Other (See Comments)    Drop in blood pressure, Muscle spasm  . Statins Other (See Comments)    Myalgias     Level of Care/Admitting Diagnosis ED Disposition    None      B Medical/Surgery History Past Medical History:  Diagnosis Date  . Abnormality of aortic valve    a. possibly bicuspid.  . Asthma    Childhood  . Carotid artery disease ([place])    a. 1-39% bilaterally in 2017.  Marland Kitchen Coronary artery disease    a. anterior STEMI 04/2011, s/p DES to LAD.  Marland Kitchen DVT (deep venous thrombosis) (HCC)    a. tx with Xarelto x 5 months, stopped due to hematuria.  . Frequent PVCs    a. seen on Holter monitor.  . Hyperlipidemia, mixed   . Hypertension   . Myocardial infarction ([place]) 05/21/2011   anterior wall, treated by Dr. [name] with 3.0x32m Promus drug eluting stent to proximal LAD   . Peripheral arterial disease (HCC)    moderate left ICA disease, mild right ICA disease  . Prostate cancer ([place] 2007  . Sinus bradycardia    a. HR 50s at home.  . Statin  intolerance   . Unstable angina ([place] 08/03/2018   Past Surgical History:  Procedure Laterality Date  . CARDIAC CATHETERIZATION  05/23/2011, 05/21/2011   05/21/2011   3.0 x 269mPromus Element drug eluting stent positioned in proximal LAD, stenosis taken from 99% to 0%.  . CORONARY STENT INTERVENTION N/A 09/21/2017   Procedure: CORONARY STENT INTERVENTION;  Surgeon: BeLorretta HarpMD;  [facility];  Service: Cardiovascular;  Laterality: N/A;  . CORONARY STENT INTERVENTION N/A 08/03/2018   Procedure: CORONARY STENT INTERVENTION;  Surgeon: JoMartiniquePeter M, MD;  [facility];  Service: Cardiovascular;  Laterality: N/A;  . CORONARY STENT INTERVENTION N/A 06/05/2019   Procedure: CORONARY STENT INTERVENTION;  Surgeon: JoMartiniquePeter M, MD;  [facility];  Service: Cardiovascular;  Laterality: N/A;  . CORONARY/GRAFT ACUTE MI REVASCULARIZATION N/A 09/21/2017   Procedure: Coronary/Graft Acute MI Revascularization;  Surgeon: JoMartiniquePeter M, MD;  [facility];  Service: Cardiovascular;  Laterality: N/A;  . HERNIA REPAIR    . INTRAVASCULAR PRESSURE WIRE/FFR STUDY N/A 09/21/2017   Procedure: INTRAVASCULAR PRESSURE WIRE/FFR STUDY;  Surgeon: BeLorretta HarpMD;  [facility];  Service: Cardiovascular;  Laterality: N/A;  . LEFT HEART CATH AND CORONARY ANGIOGRAPHY N/A 09/21/2017  Procedure: LEFT HEART CATH AND CORONARY ANGIOGRAPHY;  Surgeon: [name], Peter M, MD;  Location: [facility];  Service: Cardiovascular;  Laterality: N/A;  . LEFT HEART CATH AND CORONARY ANGIOGRAPHY N/A 09/21/2017   Procedure: LEFT HEART CATH AND CORONARY ANGIOGRAPHY;  Surgeon: [name], MD;  Location: [facility];  Service: Cardiovascular;  Laterality: N/A;  . LEFT HEART CATH AND CORONARY ANGIOGRAPHY N/A 01/11/2018   Procedure: LEFT HEART CATH AND CORONARY ANGIOGRAPHY;  Surgeon: [name], Peter M, MD;  Location: [facility];   Service: Cardiovascular;  Laterality: N/A;  . LEFT HEART CATH AND CORONARY ANGIOGRAPHY N/A 08/03/2018   Procedure: LEFT HEART CATH AND CORONARY ANGIOGRAPHY;  Surgeon: [name], Peter M, MD;  Location: [facility];  Service: Cardiovascular;  Laterality: N/A;  . LEFT HEART CATH AND CORONARY ANGIOGRAPHY N/A 06/05/2019   Procedure: LEFT HEART CATH AND CORONARY ANGIOGRAPHY;  Surgeon: [name], Peter M, MD;  Location: [facility];  Service: Cardiovascular;  Laterality: N/A;  . PROSTATECTOMY  2007     A IV Location/Drains/Wounds Patient Lines/Drains/Airways Status   Active Line/Drains/Airways    Name:   Placement date:   Placement time:   Site:   Days:   Peripheral IV 06/06/19 Right Hand   06/06/19    1800    Hand   less than 1          Intake/Output Last 24 hours No intake or output data in the 24 hours ending 06/06/19 1945  Labs/Imaging Results for orders placed or performed during the hospital encounter of 06/06/19 (from the past 48 hour(s))  Basic metabolic panel     Status: None   Collection Time: 06/06/19  3:33 PM  Result Value Ref Range   Sodium 141 135 - 145 mmol/L   Potassium 4.3 3.5 - 5.1 mmol/L   Chloride 106 98 - 111 mmol/L   CO2 25 22 - 32 mmol/L   Glucose, Bld 96 70 - 99 mg/dL   BUN 12 8 - 23 mg/dL   Creatinine, Ser 0.81 0.61 - 1.24 mg/dL   Calcium 9.1 8.9 - 10.3 mg/dL   GFR calc non Af Amer >60 >60 mL/min   GFR calc Af Amer >60 >60 mL/min   Anion gap 10 5 - 15    Comment: Performed at [facility], [place] [address], [place] [address]  CBC     Status: None   Collection Time: 06/06/19  3:33 PM  Result Value Ref Range   WBC 7.2 4.0 - 10.5 K/uL   RBC 5.31 4.22 - 5.81 MIL/uL   Hemoglobin 16.6 13.0 - 17.0 g/dL   HCT 49.4 39.0 - 52.0 %   MCV 93.0 80.0 - 100.0 fL   MCH 31.3 26.0 - 34.0 pg   MCHC 33.6 30.0 - 36.0 g/dL   RDW 12.5 11.5 - 15.5 %   Platelets 251 150 - 400 K/uL   nRBC 0.0 0.0 - 0.2 %    Comment: Performed at [facility],  [place] [address] [place], [place] [address]  Troponin I (High Sensitivity)     Status: Abnormal   Collection Time: 06/06/19  3:33 PM  Result Value Ref Range   Troponin I (High Sensitivity) 30 (H) <18 ng/L    Comment: (NOTE) Elevated high sensitivity troponin I (hsTnI) values and significant  changes across serial measurements may suggest ACS but many other  chronic and acute conditions are known to elevate hsTnI results.  Refer to the "Links" section for chest pain algorithms and additional  guidance. Performed at [facility], [place] [address] [place], [place] [address]   Troponin I (High Sensitivity)     Status: Abnormal   Collection Time: 06/06/19  6:05 PM  Result Value Ref Range   Troponin I (High Sensitivity) 26 (H) <18 ng/L    Comment: (NOTE) Elevated high sensitivity troponin I (hsTnI) values and significant  changes across serial measurements may suggest ACS but many other  chronic and acute conditions are known to elevate hsTnI results.  Refer to the "Links" section for chest pain algorithms and additional  guidance. Performed at [facility], [place] [address] [place], [place] [address]    Dg Chest 2 View  Result Date: 06/06/2019 CLINICAL DATA:  68 year old male with a history of chest pain EXAM: CHEST - 2 VIEW COMPARISON:  December 28, 2017 FINDINGS: Cardiomediastinal silhouette unchanged in size and contour. No evidence of central vascular congestion. No pneumothorax or pleural effusion. No confluent airspace disease. No displaced fracture identified. Evidence of prior PTC I IMPRESSION: Negative for acute cardiopulmonary disease. Evidence of prior PTCI Electronically Signed   By: Corrie Mckusick D.O.   On: 06/06/2019 16:54    Pending Labs Unresulted Labs (From admission, onward)   None      Vitals/Pain Today's Vitals   06/06/19 1830 06/06/19 1845 06/06/19 1900 06/06/19 1930  BP: (!) 142/87 (!) 153/82 (!) 146/94 (!) 166/90  Pulse: 65 63 63 65  Resp: _0 Temp:      TempSrc:      SpO2: 95% 96% 95% 99%  PainSc:        Isolation Precautions No active isolations  Medications Medications  sodium chloride flush (NS) 0.9 % injection 3 mL (has no administration in time range)  nitroGLYCERIN 50 mg in dextrose 5 % 250 mL (0.2 mg/mL) infusion (has no administration in time range)  aspirin chewable tablet 243 mg (has no administration in time range)    Mobility walks Low fall risk   Focused Assessments Cardiac Assessment Handoff:  Cardiac Rhythm: Normal sinus rhythm Lab Results  Component Value Date   CKTOTAL 59 09/21/2017   CKMB 2.4 09/21/2017   TROPONINI <0.03 10/11/2017   No results found for: DDIMER Does the Patient currently have chest pain? Yes     R Recommendations: See Admitting Provider Note  Report given to:   Additional Notes: NTG drip; S/P heart cath w/ stent placement 06/05/2019

## 2019-06-06 NOTE — Telephone Encounter (Signed)
Consulted Dr. [name] after initial conversation with pt. Per Dr. [name], pt heart cath was okay so results of cath do not correspond with pt chest pressure. He reviewed discharge notes, noting that pt was on amlodipine 2.5 mg daily and stated that pt should increase amlodipine to 5 mg daily d/t elevated BP since this may be contributing to his pain.  Spoke with pt who states his chest pressure has now resolved after taking his third nitro. Advised him of the recommendation to increase his amlodipine to 5 mg daily. He states he has been taking 5 mg since last week since he was advised to do so. Reviewed 9/3 OV notes for Almyra Deforest, PA-C which stated under Plan: "Hypertension: Blood pressure elevated.  Increase amlodipine to 5 mg daily"  Consulted Dr. [name] who states pt can double his amlodipine, taking 10 mg by mouth daily to help with BP. Rx sent to pharmacy on file. Pt questioned if he is okay to take nitro again today if CP reoccurs. Advised pt that he may take up to 3 nitro, one every 5 minutes within a 15 min time span if CP returns and that if it returns and does not go away after 3 nitro he should call 911 or present to ED. Pt verbalized understanding

## 2019-06-06 NOTE — ED Provider Notes (Signed)
[facility] EMERGENCY DEPARTMENT Provider Note   CSN: [id number] Arrival date & time: 06/06/19  1520     History   Chief Complaint Chief Complaint  Patient presents with  . Chest Pain  . Shortness of Breath    HPI [name] is a 68 y.o. male.     Patient status post cardiac catheterization yesterday for several weeks of chest pain that was felt to be an unstable angina picture.  Done by Peter [name] it was an outpatient procedure.  They found a 90% occlusion in the circumflex just distal to where he already had a stent.  The stent was placed.  Otherwise things were open.  This is the patient's fifth stent.  Patient continues to have chest pain since the procedure got significantly worse around 12 noon.  Took several nitro.  Improved some then came back discussed with [facility] office.  They recommended he come in for evaluation.  Now pain or discomfort is 3 out of 10 substernal.  Earlier in the day it was substernal and radiated to both sides of the chest.  Patient's blood pressure also was apparently elevated at home.  Under better control currently.     Past Medical History:  Diagnosis Date  . Abnormality of aortic valve    a. possibly bicuspid.  . Asthma    Childhood  . Carotid artery disease ([place])    a. 1-39% bilaterally in 2017.  Marland Kitchen Coronary artery disease    a. anterior STEMI 04/2011, s/p DES to LAD.  Marland Kitchen DVT (deep venous thrombosis) (HCC)    a. tx with Xarelto x 5 months, stopped due to hematuria.  . Frequent PVCs    a. seen on Holter monitor.  . Hyperlipidemia, mixed   . Hypertension   . Myocardial infarction ([place]) 05/21/2011   anterior wall, treated by Dr. [name] with 3.0x39m Promus drug eluting stent to proximal LAD   . Peripheral arterial disease (HCC)    moderate left ICA disease, mild right ICA disease  . Prostate cancer ([facility] 2007  . Sinus bradycardia    a. HR 50s at home.  . Statin intolerance   . Unstable angina ([place]  08/03/2018    Patient Active Problem List   Diagnosis Date Noted  . Unstable angina ([facility] 08/03/2018  . NSTEMI (non-ST elevated myocardial infarction) ([facility] 09/20/2017  . Rupture of plantaris tendon, right, subsequent encounter 08/15/2016  . Hematoma of leg, right, subsequent encounter 08/15/2016  . Right calf pain 08/11/2016  . History of DVT (deep vein thrombosis) 08/11/2016  . Angina pectoris ([facility] 10/08/2014  . Palpitations 10/08/2014  . Bilateral carotid bruits 10/08/2014  . CAD S/P percutaneous coronary angioplasty 03/20/2013  . HTN (hypertension) 03/20/2013  . Mixed hyperlipidemia 03/20/2013    Past Surgical History:  Procedure Laterality Date  . CARDIAC CATHETERIZATION  05/23/2011, 05/21/2011   05/21/2011   3.0 x 220mPromus Element drug eluting stent positioned in proximal LAD, stenosis taken from 99% to 0%.  . CORONARY STENT INTERVENTION N/A 09/21/2017   Procedure: CORONARY STENT INTERVENTION;  Surgeon: BeLorretta HarpMD;  [facility];  Service: Cardiovascular;  Laterality: N/A;  . CORONARY STENT INTERVENTION N/A 08/03/2018   Procedure: CORONARY STENT INTERVENTION;  Surgeon: JoMartiniquePeter M, MD;  [facility];  Service: Cardiovascular;  Laterality: N/A;  . CORONARY STENT INTERVENTION N/A 06/05/2019   Procedure: CORONARY STENT INTERVENTION;  Surgeon: JoMartiniquePeter M, MD;  [facility];  Service: Cardiovascular;  Laterality: N/A;  . CORONARY/GRAFT ACUTE MI REVASCULARIZATION N/A 09/21/2017   Procedure: Coronary/Graft Acute MI Revascularization;  Surgeon: [name], Peter M, MD;  Location: [facility];  Service: Cardiovascular;  Laterality: N/A;  . HERNIA REPAIR    . INTRAVASCULAR PRESSURE WIRE/FFR STUDY N/A 09/21/2017   Procedure: INTRAVASCULAR PRESSURE WIRE/FFR STUDY;  Surgeon: [name], MD;  Location: [place] [facility];  Service: Cardiovascular;  Laterality: N/A;  . LEFT HEART CATH AND CORONARY ANGIOGRAPHY N/A  09/21/2017   Procedure: LEFT HEART CATH AND CORONARY ANGIOGRAPHY;  Surgeon: [name], Peter M, MD;  Location: [facility];  Service: Cardiovascular;  Laterality: N/A;  . LEFT HEART CATH AND CORONARY ANGIOGRAPHY N/A 09/21/2017   Procedure: LEFT HEART CATH AND CORONARY ANGIOGRAPHY;  Surgeon: [name], MD;  Location: [facility] LAB;  Service: Cardiovascular;  Laterality: N/A;  . LEFT HEART CATH AND CORONARY ANGIOGRAPHY N/A 01/11/2018   Procedure: LEFT HEART CATH AND CORONARY ANGIOGRAPHY;  Surgeon: [name], Peter M, MD;  Location: [facility] LAB;  Service: Cardiovascular;  Laterality: N/A;  . LEFT HEART CATH AND CORONARY ANGIOGRAPHY N/A 08/03/2018   Procedure: LEFT HEART CATH AND CORONARY ANGIOGRAPHY;  Surgeon: [name], Peter M, MD;  Location: [facility] LAB;  Service: Cardiovascular;  Laterality: N/A;  . LEFT HEART CATH AND CORONARY ANGIOGRAPHY N/A 06/05/2019   Procedure: LEFT HEART CATH AND CORONARY ANGIOGRAPHY;  Surgeon: [name], Peter M, MD;  Location: [facility] LAB;  Service: Cardiovascular;  Laterality: N/A;  . PROSTATECTOMY  2007        Home Medications    Prior to Admission medications   Medication Sig Start Date End Date Taking? Authorizing Provider  amLODipine (NORVASC) 10 MG tablet Take 1 tablet (10 mg total) by mouth daily. 06/06/19  Yes [name], Peter M, MD  aspirin 81 MG tablet Take 81 mg by mouth daily.    Yes [provider]  clopidogrel (PLAVIX) 75 MG tablet Take 1 tablet (75 mg total) by mouth daily. 05/30/19  Yes Almyra Deforest, PA  Coenzyme Q10 (CO Q-10) 100 MG CAPS Take 100 mg by mouth daily.    Yes [provider]  losartan (COZAAR) 25 MG tablet Take 1 tablet (25 mg total) by mouth daily. 05/30/19  Yes Almyra Deforest, PA  metoprolol tartrate (LOPRESSOR) 25 MG tablet Take 0.5 tablets (12.5 mg total) by mouth every morning AND 1 tablet (25 mg total) every evening. Patient taking differently: Take 0.5 tablets (12.5 mg total) by mouth every morning  then take 1 tablet (25 mg total) every evening. 05/30/19  Yes Almyra Deforest, PA  Multiple Vitamin (MULTIVITAMIN WITH MINERALS) TABS tablet Take 1 tablet by mouth daily.   Yes [provider]  nitroGLYCERIN (NITROSTAT) 0.4 MG SL tablet Place 1 tablet (0.4 mg total) under the tongue every 5 (five) minutes as needed for chest pain. Patient taking differently: Place 0.4 mg under the tongue every 5 (five) minutes x 3 doses as needed for chest pain.  05/30/19  Yes Almyra Deforest, PA    Family History Family History  Problem Relation Age of Onset  . Leukemia Mother   . Heart failure Father        CABG at age 22    Social History Social History   Tobacco Use  . Smoking status: Former Smoker    Packs/day: 1.00    Years: 7.00    Pack years: 7.00    Types: Cigarettes  Start date: 03/05/1970    Quit date: 09/26/1976    Years since quitting: 42.7  . Smokeless tobacco: Never Used  Substance Use Topics  . Alcohol use: No    Alcohol/week: 0.0 standard drinks  . Drug use: No     Allergies   Adhesive [tape], Demerol [meperidine], Fentanyl, and Statins   Review of Systems Review of Systems  Constitutional: Negative for chills and fever.  HENT: Negative for congestion, rhinorrhea and sore throat.   Eyes: Negative for visual disturbance.  Respiratory: Positive for shortness of breath. Negative for cough.   Cardiovascular: Negative for chest pain and leg swelling.  Gastrointestinal: Negative for abdominal pain, diarrhea, nausea and vomiting.  Genitourinary: Negative for dysuria.  Musculoskeletal: Negative for back pain and neck pain.  Skin: Negative for rash.  Neurological: Negative for dizziness, light-headedness and headaches.  Hematological: Does not bruise/bleed easily.  Psychiatric/Behavioral: Negative for confusion.     Physical Exam Updated Vital Signs BP (!) 135/91   Pulse (!) 56   Temp 98.7 F (37.1 C) (Oral)   Resp 16   SpO2 95%   Physical Exam Vitals signs and nursing  note reviewed.  Constitutional:      Appearance: Normal appearance. He is well-developed.  HENT:     Head: Normocephalic and atraumatic.  Eyes:     Extraocular Movements: Extraocular movements intact.     Conjunctiva/sclera: Conjunctivae normal.     Pupils: Pupils are equal, round, and reactive to light.  Neck:     Musculoskeletal: Normal range of motion and neck supple.  Cardiovascular:     Rate and Rhythm: Normal rate and regular rhythm.     Heart sounds: No murmur.  Pulmonary:     Effort: Pulmonary effort is normal. No respiratory distress.     Breath sounds: Normal breath sounds.  Abdominal:     Palpations: Abdomen is soft.     Tenderness: There is no abdominal tenderness.  Musculoskeletal: Normal range of motion.        General: No swelling.  Skin:    General: Skin is warm and dry.  Neurological:     General: No focal deficit present.     Mental Status: He is alert and oriented to person, place, and time.      ED Treatments / Results  Labs (all labs ordered are listed, but only abnormal results are displayed) Labs Reviewed  TROPONIN I (HIGH SENSITIVITY) - Abnormal; Notable for the following components:      Result Value   Troponin I (High Sensitivity) 30 (*)    All other components within normal limits  TROPONIN I (HIGH SENSITIVITY) - Abnormal; Notable for the following components:   Troponin I (High Sensitivity) 26 (*)    All other components within normal limits  BASIC METABOLIC PANEL  CBC    EKG EKG Interpretation  Date/Time:  Thursday June 06 2019 15:26:01 EDT Ventricular Rate:  74 PR Interval:  136 QRS Duration: 78 QT Interval:  380 QTC Calculation: 421 R Axis:   -30 Text Interpretation:  Normal sinus rhythm Left axis deviation Abnormal ECG No significant change since last tracing Confirmed by Fredia Sorrow [phone]) on 06/06/2019 4:30:09 PM   Radiology Dg Chest 2 View  Result Date: 06/06/2019 CLINICAL DATA:  68 year old male with a history  of chest pain EXAM: CHEST - 2 VIEW COMPARISON:  December 28, 2017 FINDINGS: Cardiomediastinal silhouette unchanged in size and contour. No evidence of central vascular congestion. No pneumothorax or pleural effusion. No confluent  airspace disease. No displaced fracture identified. Evidence of prior PTC I IMPRESSION: Negative for acute cardiopulmonary disease. Evidence of prior PTCI Electronically Signed   By: Corrie Mckusick D.O.   On: 06/06/2019 16:54    Procedures Procedures (including critical care time)  CRITICAL CARE Performed by: Fredia Sorrow Total critical care time: 30 minutes Critical care time was exclusive of separately billable procedures and treating other patients. Critical care was necessary to treat or prevent imminent or life-threatening deterioration. Critical care was time spent personally by me on the following activities: development of treatment plan with patient and/or surrogate as well as nursing, discussions with consultants, evaluation of patient's response to treatment, examination of patient, obtaining history from patient or surrogate, ordering and performing treatments and interventions, ordering and review of laboratory studies, ordering and review of radiographic studies, pulse oximetry and re-evaluation of patient's condition.   Medications Ordered in ED Medications  sodium chloride flush (NS) 0.9 % injection 3 mL (has no administration in time range)     Initial Impression / Assessment and Plan / ED Course  I have reviewed the triage vital signs and the nursing notes.  Pertinent labs & imaging results that were available during my care of the patient were reviewed by me and considered in my medical decision making (see chart for details).       Chest x-ray without any acute findings.  Initial troponin was 30 repeat was 26.  EKG without acute changes.  Patient still with some discomfort substernal area rates the chest pain 3 out of 10.  As stated above status  post cardiac cath yesterday with a stent placed in the circumflex.  Had 90% occlusion at that time.  Discussed with cardiology on-call they will see the patient in the emergency department.  Cardiology is concerned about this representing unstable angina.  Based on this patient will be started on heparin and nitroglycerin drip.  They are going to admit him.  They are considering probably repeating the cardiac cath tomorrow morning.  Nitroglycerin and heparin ordered as well as COVID testing ordered.   Final Clinical Impressions(s) / ED Diagnoses   Final diagnoses:  Precordial pain    ED Discharge Orders    None       Fredia Sorrow, MD 06/06/19 1956

## 2019-06-06 NOTE — Telephone Encounter (Signed)
° ° °  Pt c/o of Chest Pain: STAT if CP now or developed within 24 hours  1. Are you having CP right now? YES  2. Are you experiencing any other symptoms (ex. SOB, nausea, vomiting, sweating)? "FEELS HORRIBLE"  3. How long have you been experiencing CP? 1 DAY  4. Is your CP continuous or coming and going? CONTINUOUS 5. Have you taken Nitroglycerin? YES ?

## 2019-06-06 NOTE — ED Notes (Signed)
Tiffany, RN witnessed 4000 units bolus from heparin bag; followed by 10 units/hr. continuous rate

## 2019-06-06 NOTE — Telephone Encounter (Signed)
Spoke with pt who states he has had chest pressure for 1 month but it has worsened today after his heart cath that he had yesterday. Pt states he has 'pressure in chest from shoulder to shoulder'. He states he also has 'a bad headache', is 'shaking', and has SOB. No other symptoms. Pt reports that his BP is 175/100. He states he had taken his second nitro before speaking with triage nurse. Pt states the second nitro has helped decrease the chest pressure but it is still there. Advised pt to take third nitro while on phone since it had been 10 minutes since taking first and second nitro. Advised pt that if he still has chest pressure after taking his third nitro, he should call 911 or be assisted to the nearest ED for eval. Pt verbalized understanding and stated that this is his 'fifth stent' and that he has had 'lots of complications' and would like to know if Dr. [name] is in the hospital today. Informed him that he is in clinic seeing pts. Pt would like encounter routed to Dr. [name] to make him aware. Informed pt that triage nurse will route encounter and also speak with Dr. [name]. He states he can be reached at his cell phone number [phone].

## 2019-06-06 NOTE — ED Triage Notes (Signed)
Reports sob and chest pain with radiation to bilateral shoulders and back that started around lunch time and got worse. States that he received a cardiac cath yesterday and had a stent placed (his 5th one) states he took 3 nitro with pain relief but pain started coming back.

## 2019-06-07 ENCOUNTER — Encounter (HOSPITAL_COMMUNITY): Payer: Self-pay | Admitting: Cardiovascular Disease

## 2019-06-07 ENCOUNTER — Encounter (HOSPITAL_COMMUNITY)
Admission: EM | Disposition: A | Discharge status: Home / Self Care | Payer: Self-pay | Source: Home / Self Care | Attending: Cardiology

## 2019-06-07 ENCOUNTER — Other Ambulatory Visit: Payer: Self-pay

## 2019-06-07 DIAGNOSIS — I25110 Atherosclerotic heart disease of native coronary artery with unstable angina pectoris: Principal | ICD-10-CM

## 2019-06-07 HISTORY — PX: CORONARY STENT INTERVENTION: CATH118234

## 2019-06-07 HISTORY — PX: LEFT HEART CATH AND CORONARY ANGIOGRAPHY: CATH118249

## 2019-06-07 HISTORY — PX: CORONARY PRESSURE/FFR STUDY: CATH118243

## 2019-06-07 LAB — CBC
HCT: 43 % (ref 39.0–52.0)
Hemoglobin: 14.4 g/dL (ref 13.0–17.0)
MCH: 30.9 pg (ref 26.0–34.0)
MCHC: 33.5 g/dL (ref 30.0–36.0)
MCV: 92.3 fL (ref 80.0–100.0)
Platelets: 230 10*3/uL (ref 150–400)
RBC: 4.66 MIL/uL (ref 4.22–5.81)
RDW: 12.6 % (ref 11.5–15.5)
WBC: 8.7 10*3/uL (ref 4.0–10.5)
nRBC: 0 % (ref 0.0–0.2)

## 2019-06-07 LAB — TROPONIN I (HIGH SENSITIVITY): Troponin I (High Sensitivity): 24 ng/L — ABNORMAL HIGH (ref <18)

## 2019-06-07 LAB — COMPREHENSIVE METABOLIC PANEL
ALT: 20 U/L (ref 0–44)
AST: 18 U/L (ref 15–41)
Albumin: 3.6 g/dL (ref 3.5–5.0)
Alkaline Phosphatase: 70 U/L (ref 38–126)
Anion gap: 9 (ref 5–15)
BUN: 12 mg/dL (ref 8–23)
CO2: 24 mmol/L (ref 22–32)
Calcium: 8.6 mg/dL — ABNORMAL LOW (ref 8.9–10.3)
Chloride: 107 mmol/L (ref 98–111)
Creatinine, Ser: 0.86 mg/dL (ref 0.61–1.24)
GFR calc Af Amer: >60 mL/min (ref >60)
GFR calc non Af Amer: >60 mL/min (ref >60)
Glucose, Bld: 105 mg/dL — ABNORMAL HIGH (ref 70–99)
Potassium: 4 mmol/L (ref 3.5–5.1)
Sodium: 140 mmol/L (ref 135–145)
Total Bilirubin: 0.9 mg/dL (ref 0.3–1.2)
Total Protein: 5.6 g/dL — ABNORMAL LOW (ref 6.5–8.1)

## 2019-06-07 LAB — HEPARIN LEVEL (UNFRACTIONATED): Heparin Unfractionated: 0.3 IU/mL (ref 0.30–0.70)

## 2019-06-07 LAB — POCT ACTIVATED CLOTTING TIME: Activated Clotting Time: 263 seconds

## 2019-06-07 LAB — HIV ANTIBODY (ROUTINE TESTING W REFLEX): HIV Screen 4th Generation wRfx: NONREACTIVE

## 2019-06-07 SURGERY — LEFT HEART CATH AND CORONARY ANGIOGRAPHY
Anesthesia: LOCAL

## 2019-06-07 MED ORDER — VERAPAMIL HCL 2.5 MG/ML IV SOLN
INTRAVENOUS | Status: AC
  Filled 2019-06-07: qty 2

## 2019-06-07 MED ORDER — LIDOCAINE HCL (PF) 1 % IJ SOLN
INTRAMUSCULAR | Status: DC | PRN
  Administered 2019-06-07: 2 mL

## 2019-06-07 MED ORDER — HEPARIN (PORCINE) IN NACL 1000-0.9 UT/500ML-% IV SOLN
INTRAVENOUS | Status: DC | PRN
  Administered 2019-06-07 (×3): 500 mL

## 2019-06-07 MED ORDER — HEPARIN (PORCINE) IN NACL 1000-0.9 UT/500ML-% IV SOLN
INTRAVENOUS | Status: AC
  Filled 2019-06-07: qty 1500

## 2019-06-07 MED ORDER — MIDAZOLAM HCL 2 MG/2ML IJ SOLN
INTRAMUSCULAR | Status: DC | PRN
  Administered 2019-06-07 (×2): 2 mg via INTRAVENOUS

## 2019-06-07 MED ORDER — CLOPIDOGREL BISULFATE 75 MG PO TABS
75.0000 mg | ORAL_TABLET | ORAL | Status: AC
  Administered 2019-06-07: 75 mg via ORAL
  Filled 2019-06-07: qty 1

## 2019-06-07 MED ORDER — CLOPIDOGREL BISULFATE 300 MG PO TABS
ORAL_TABLET | ORAL | Status: AC
  Filled 2019-06-07: qty 1

## 2019-06-07 MED ORDER — SODIUM CHLORIDE 0.9% FLUSH
3.0000 mL | Freq: Two times a day (BID) | INTRAVENOUS | Status: DC
  Administered 2019-06-07: 3 mL via INTRAVENOUS

## 2019-06-07 MED ORDER — HEPARIN SODIUM (PORCINE) 1000 UNIT/ML IJ SOLN
INTRAMUSCULAR | Status: AC
  Filled 2019-06-07: qty 1

## 2019-06-07 MED ORDER — NITROGLYCERIN 1 MG/10 ML FOR IR/CATH LAB
INTRA_ARTERIAL | Status: DC | PRN
  Administered 2019-06-07: 200 ug via INTRACORONARY

## 2019-06-07 MED ORDER — SODIUM CHLORIDE 0.9 % WEIGHT BASED INFUSION: 1.0000 mL/kg/h | INTRAVENOUS | Status: AC

## 2019-06-07 MED ORDER — ANGIOPLASTY BOOK
Freq: Once | Status: AC
  Administered 2019-06-08: 05:00:00
  Filled 2019-06-07: qty 1

## 2019-06-07 MED ORDER — HEPARIN SODIUM (PORCINE) 1000 UNIT/ML IJ SOLN
INTRAMUSCULAR | Status: DC | PRN
  Administered 2019-06-07: 2000 [IU] via INTRAVENOUS
  Administered 2019-06-07 (×2): 4000 [IU] via INTRAVENOUS

## 2019-06-07 MED ORDER — SODIUM CHLORIDE 0.9% FLUSH: 3.0000 mL | INTRAVENOUS | Status: DC | PRN

## 2019-06-07 MED ORDER — CLOPIDOGREL BISULFATE 300 MG PO TABS
ORAL_TABLET | ORAL | Status: DC | PRN
  Administered 2019-06-07: 300 mg via ORAL

## 2019-06-07 MED ORDER — MORPHINE SULFATE (PF) 2 MG/ML IV SOLN
INTRAVENOUS | Status: AC
  Filled 2019-06-07: qty 1

## 2019-06-07 MED ORDER — VERAPAMIL HCL 2.5 MG/ML IV SOLN
INTRAVENOUS | Status: DC | PRN
  Administered 2019-06-07: 10 mL via INTRA_ARTERIAL

## 2019-06-07 MED ORDER — MIDAZOLAM HCL 2 MG/2ML IJ SOLN
INTRAMUSCULAR | Status: AC
  Filled 2019-06-07: qty 2

## 2019-06-07 MED ORDER — ENOXAPARIN SODIUM 40 MG/0.4ML ~~LOC~~ SOLN
40.0000 mg | SUBCUTANEOUS | Status: DC
  Administered 2019-06-08: 40 mg via SUBCUTANEOUS
  Filled 2019-06-07: qty 0.4

## 2019-06-07 MED ORDER — LIDOCAINE HCL (PF) 1 % IJ SOLN
INTRAMUSCULAR | Status: AC
  Filled 2019-06-07: qty 30

## 2019-06-07 MED ORDER — SODIUM CHLORIDE 0.9 % IV SOLN: 250.0000 mL | INTRAVENOUS | Status: DC | PRN

## 2019-06-07 MED ORDER — IOHEXOL 350 MG/ML SOLN
INTRAVENOUS | Status: DC | PRN
  Administered 2019-06-07: 10:00:00 135 mL

## 2019-06-07 MED ORDER — NITROGLYCERIN 1 MG/10 ML FOR IR/CATH LAB
INTRA_ARTERIAL | Status: AC
  Filled 2019-06-07: qty 10

## 2019-06-07 SURGICAL SUPPLY — 17 items
BALLN SAPPHIRE 2.5X20 (BALLOONS) ×2
BALLN SAPPHIRE ~~LOC~~ 3.25X15 (BALLOONS) ×1 IMPLANT
BALLOON SAPPHIRE 2.5X20 (BALLOONS) IMPLANT
CATH INFINITI 5FR JK (CATHETERS) ×1 IMPLANT
CATH LAUNCHER 6FR EBU3.5 (CATHETERS) ×1 IMPLANT
DEVICE RAD COMP TR BAND LRG (VASCULAR PRODUCTS) ×1 IMPLANT
GLIDESHEATH SLEND SS 6F .021 (SHEATH) ×1 IMPLANT
GUIDEWIRE INQWIRE 1.5J.035X260 (WIRE) IMPLANT
GUIDEWIRE PRESSURE COMET II (WIRE) ×1 IMPLANT
INQWIRE 1.5J .035X260CM (WIRE) ×2
KIT ENCORE 26 ADVANTAGE (KITS) ×1 IMPLANT
KIT ESSENTIALS PG (KITS) ×1 IMPLANT
KIT HEART LEFT (KITS) ×2 IMPLANT
PACK CARDIAC CATHETERIZATION (CUSTOM PROCEDURE TRAY) ×2 IMPLANT
STENT RESOLUTE ONYX 3.0X30 (Permanent Stent) ×1 IMPLANT
TRANSDUCER W/STOPCOCK (MISCELLANEOUS) ×2 IMPLANT
TUBING CIL FLEX 10 FLL-RA (TUBING) ×2 IMPLANT

## 2019-06-07 NOTE — Progress Notes (Addendum)
Progress Note  Patient Name: [name] Date of Encounter: 06/07/2019  Primary Cardiologist: Peter [name], MD   Subjective   Cath this morning. Mild chest pressure, but improved.   Inpatient Medications    Scheduled Meds: . amLODipine  10 mg Oral Daily  . aspirin  81 mg Oral Daily  . clopidogrel  75 mg Oral Daily  . [START ON 06/08/2019] enoxaparin (LOVENOX) injection  40 mg Subcutaneous Q24H  . losartan  25 mg Oral Daily  . metoprolol tartrate  12.5 mg Oral Daily  . metoprolol tartrate  25 mg Oral QHS  . multivitamin with minerals  1 tablet Oral Daily  . sodium chloride flush  3 mL Intravenous Once  . sodium chloride flush  3 mL Intravenous Q12H  . sodium chloride flush  3 mL Intravenous Q12H  . sodium chloride flush  3 mL Intravenous Q12H   Continuous Infusions: . sodium chloride    . sodium chloride    . sodium chloride 1 mL/kg/hr (06/07/19 1031)  . nitroGLYCERIN Stopped (06/07/19 0340)   PRN Meds: sodium chloride, sodium chloride, acetaminophen, ALPRAZolam, morphine injection, nitroGLYCERIN, ondansetron (ZOFRAN) IV, sodium chloride flush, sodium chloride flush, zolpidem   Vital Signs    Vitals:   06/07/19 0844 06/07/19 1013 06/07/19 1034 06/07/19 1300  BP:  (!) 186/86 (!) 180/91 125/89  Pulse:  72 68 67  Resp:   13   Temp:      TempSrc:  Temporal    SpO2: 98%   95%  Weight:      Height:        Intake/Output Summary (Last 24 hours) at 06/07/2019 1340 Last data filed at 06/07/2019 1217 Gross per 24 hour  Intake 526.61 ml  Output 1525 ml  Net -998.39 ml   Last 3 Weights 06/07/2019 06/05/2019 05/30/2019  Weight (lbs) 177 lb 9.6 oz 182 lb 182 lb 9.6 oz  Weight (kg) 80.559 kg 82.555 kg 82.827 kg      Telemetry    SR - Personally Reviewed  ECG    SR with nonspecific changes - Personally Reviewed  Physical Exam  Older WM GEN: No acute distress.   Neck: No JVD Cardiac: RRR, no murmurs, rubs, or gallops.  Respiratory: Clear to auscultation  bilaterally. GI: Soft, nontender, non-distended  MS: No edema; No deformity. TR band in place, Right femoral cath site stable from 9/9 Neuro:  Nonfocal  Psych: Normal affect   Labs    High Sensitivity Troponin:   Recent Labs  Lab 06/06/19 1533 06/06/19 1805 06/07/19 0135  TROPONINIHS 30* 26* 24*      Chemistry Recent Labs  Lab 06/06/19 1533 06/07/19 0135  NA 141 140  K 4.3 4.0  CL 106 107  CO2 25 24  GLUCOSE 96 105*  BUN 12 12  CREATININE 0.81 0.86  CALCIUM 9.1 8.6*  PROT  --  5.6*  ALBUMIN  --  3.6  AST  --  18  ALT  --  20  ALKPHOS  --  70  BILITOT  --  0.9  GFRNONAA >60 >60  GFRAA >60 >60  ANIONGAP 10 9     Hematology Recent Labs  Lab 06/06/19 1533 06/07/19 0135  WBC 7.2 8.7  RBC 5.31 4.66  HGB 16.6 14.4  HCT 49.4 43.0  MCV 93.0 92.3  MCH 31.3 30.9  MCHC 33.6 33.5  RDW 12.5 12.6  PLT 251 230    BNPNo results for input(s): BNP, PROBNP in the last 168  hours.   DDimer No results for input(s): DDIMER in the last 168 hours.   Radiology    Dg Chest 2 View  Result Date: 06/06/2019 CLINICAL DATA:  68 year old male with a history of chest pain EXAM: CHEST - 2 VIEW COMPARISON:  December 28, 2017 FINDINGS: Cardiomediastinal silhouette unchanged in size and contour. No evidence of central vascular congestion. No pneumothorax or pleural effusion. No confluent airspace disease. No displaced fracture identified. Evidence of prior PTC I IMPRESSION: Negative for acute cardiopulmonary disease. Evidence of prior PTCI Electronically Signed   By: Corrie Mckusick D.O.   On: 06/06/2019 16:54    Cardiac Studies   Cath: 06/07/19   Ost LAD to Prox LAD lesion is 40% stenosed.  Non-stenotic Prox Cx-1 lesion was previously treated.  Previously placed Prox Cx-2 drug eluting stent is widely patent.  Balloon angioplasty was performed.  Previously placed Mid Cx drug eluting stent is widely patent.  Balloon angioplasty was performed.  Non-stenotic Ost RPDA to RPDA  lesion was previously treated.  RPAV lesion is 30% stenosed.  Prox RCA lesion is 30% stenosed.  Prox LAD to Mid LAD lesion is 70% stenosed.  Post intervention, there is a 0% residual stenosis.  A drug-eluting stent was successfully placed using a STENT RESOLUTE ONYX 3.0X30.  The left ventricular systolic function is normal.  LV end diastolic pressure is normal.  The left ventricular ejection fraction is 55-65% by visual estimate.   1.  Widely patent left circumflex and RCA stents.  There is a stent in the proximal LAD which is patent with moderate in-stent restenosis in the proximal segment which was not significant by DFR (0.94).  In addition, the LAD has 70% stenosis in the midsegment distal to the previously placed stent.  This was significant by DFR (0.85) and thus this was treated with PCI and drug-eluting stent placement.  The stent overlapped with the previously placed proximal stent. 2.  Normal LV systolic function and left ventricular end-diastolic pressure.  Recommendations: Dual antiplatelet therapy for at least 6 months and preferably long-term given multiple overlapped stents. Recommend referral to the lipid clinic for treatment of hyperlipidemia.  Diagnostic Dominance: Right  Intervention     Patient Profile     68 y.o. male of CAD, HTN, HLD (intolerant of statins), sinus bradycardia,history of provoked DVT, PVCs on previous 48-hour monitor, mild carotid artery disease and prostate cancer who recently underwent PCI to the Lcx (9/9) and presented to the ED with recurrent chest pain.   Assessment & Plan    1. Unstable Angina: underwent cardiac cath this morning noted above, LAD had 70% stenosis in the midsegment distal to the previously placed stent. DFR was significant at (0.85) treated with PCI/DESx1. Previously placed Lcx was widely patent from 9/9. Will plan to continue DAPT with ASA/plavix for at least 6 months.   2. HTN: blood pressures have been elevated  as an outpatient according to phone notes. Continue on BB therapy, with norvasc increased to 31m daily.   3. HL: hx of statin intolerance, planned for Lipid clinic referral.   For questions or updates, please contact [email or web address] consult [email or web address] for contact info under     Signed, LReino Bellis NP  06/07/2019, 1:40 PM    Patient seen, examined. Available data reviewed. Agree with findings, assessment, and plan as outlined by LReino Bellis NP. On my exam: Vitals:   06/07/19 1300 06/07/19 1400  BP: 125/89 (!) 143/91  Pulse: 67 69  Resp:  Temp:    SpO2: 95% 96%   Pt is alert and oriented, NAD HEENT: normal Neck: JVP - normal Lungs: CTA bilaterally CV: RRR without murmur or gallop Abd: soft, NT, Positive BS, no hepatomegaly Ext: no C/C/E, distal pulses intact and equal, right radial site clear with TR band in place Skin: warm/dry no rash  Cardiac cath films reviewed.  Patient underwent stenting of the mid LAD with a good angiographic result.  His other recently placed stents are widely patent.  Cardiac markers with just minimal elevation not unexpected with his recent PCI.  The patient will continue his current medical therapy.  Has been through multiple PCI procedures in the past and understands post PCI instructions.  He should be ready for hospital discharge tomorrow unless any problems arise overnight.  Sherren Mocha, M.D. 06/07/2019 4:04 PM

## 2019-06-07 NOTE — Interval H&P Note (Signed)
Cath Lab Visit (complete for each Cath Lab visit)  Clinical Evaluation Leading to the Procedure:   ACS: Yes.   unstable angina  Non-ACS:  n/a   History and Physical Interval Note:  06/07/2019 8:43 AM  [name]  has presented today for surgery, with the diagnosis of unstable angina.  The various methods of treatment have been discussed with the patient and family. After consideration of risks, benefits and other options for treatment, the patient has consented to  Procedure(s): LEFT HEART CATH AND CORONARY ANGIOGRAPHY (N/A) as a surgical intervention.  The patient's history has been reviewed, patient examined, no change in status, stable for surgery.  I have reviewed the patient's chart and labs.  Questions were answered to the patient's satisfaction.     Kathlyn [place]

## 2019-06-07 NOTE — Progress Notes (Signed)
[place] for Heparin   Indication: chest pain/ACS  Allergies  Allergen Reactions  . Adhesive [Tape] Hives, Itching and Other (See Comments)    Hives  And itching from the adhesive on the pads of the heart monitor   . Demerol [Meperidine] Nausea Only and Other (See Comments)    Drop in blood pressure, Muscle spasm  . Fentanyl Nausea Only and Other (See Comments)    Drop in blood pressure, Muscle spasm  . Statins Other (See Comments)    Myalgias     Patient Measurements: Height: 5\' 7"  (170.2 cm) Weight: 177 lb 9.6 oz (80.6 kg) IBW/kg (Calculated) : 66.1 Heparin Dosing Weight: 82.6 kg   Vital Signs: Temp: 98.1 F (36.7 C) (09/11 0058) Temp Source: Oral (09/11 0058) BP: 81/52 (09/11 0315) Pulse Rate: 57 (09/11 0315)  Labs: Recent Labs    06/06/19 1533 06/06/19 1805 06/07/19 0135 06/07/19 0227  HGB 16.6  --  14.4  --   HCT 49.4  --  43.0  --   PLT 251  --  230  --   HEPARINUNFRC  --   --   --  0.30  CREATININE 0.81  --  0.86  --   TROPONINIHS 30* 26* 24*  --     Estimated Creatinine Clearance: 82.4 mL/min (by C-G formula based on SCr of 0.86 mg/dL).   Medical History: Past Medical History:  Diagnosis Date  . Abnormality of aortic valve    a. possibly bicuspid.  . Asthma    Childhood  . Carotid artery disease ([place])    a. 1-39% bilaterally in 2017.  Marland Kitchen Coronary artery disease    a. anterior STEMI 04/2011, s/p DES to LAD.  Marland Kitchen DVT (deep venous thrombosis) ([facility])    a. tx with Xarelto x 5 months, stopped due to hematuria.  . Frequent PVCs    a. seen on Holter monitor.  . Hyperlipidemia, mixed   . Hypertension   . Myocardial infarction ([place]) 05/21/2011   anterior wall, treated by Dr. [name] with 3.0x98mm Promus drug eluting stent to proximal LAD   . Peripheral arterial disease ([facility])    moderate left ICA disease, mild right ICA disease  . Prostate cancer ([place]) 2007  . Sinus bradycardia    a. HR 50s at home.  .  Statin intolerance   . Unstable angina ([place]) 08/03/2018    Medications:  Scheduled:  . amLODipine  10 mg Oral Daily  . aspirin  81 mg Oral Pre-Cath  . aspirin  81 mg Oral Daily  . clopidogrel  75 mg Oral Daily  . losartan  25 mg Oral Daily  . metoprolol tartrate  12.5 mg Oral Daily  . metoprolol tartrate  25 mg Oral QHS  . multivitamin with minerals  1 tablet Oral Daily  . sodium chloride flush  3 mL Intravenous Once  . sodium chloride flush  3 mL Intravenous Q12H  . sodium chloride flush  3 mL Intravenous Q12H   Infusions:  . sodium chloride    . sodium chloride    . sodium chloride     Followed by  . sodium chloride    . heparin 1,000 Units/hr (06/06/19 2008)  . nitroGLYCERIN 5 mcg/min (06/06/19 1949)    Assessment: 68 y.o. male s/p cardiac cath on 9/9 now presenting with recurrent chest pain and shortness of breath. No anticoagulation PTA. Hgb 16.6, Plts 251. Pharmacy consulted for heparin dosing.  9/11 AM update: Initial heparin  level therapeutic   Goal of Therapy:  Heparin level 0.3-0.7 units/ml Monitor platelets by anticoagulation protocol: Yes   Plan:  Heparin 1000 units/hr  Confirmatory heparin level at 1200 Daily HL and CBC Monitor s/sx of bleeding  Narda Bonds, PharmD, BCPS Clinical Pharmacist Phone: [phone]

## 2019-06-07 NOTE — ED Notes (Signed)
Unable to take report at this time. Will call [phone]

## 2019-06-08 LAB — CBC
HCT: 45.2 % (ref 39.0–52.0)
Hemoglobin: 15.4 g/dL (ref 13.0–17.0)
MCH: 30.9 pg (ref 26.0–34.0)
MCHC: 34.1 g/dL (ref 30.0–36.0)
MCV: 90.6 fL (ref 80.0–100.0)
Platelets: 221 10*3/uL (ref 150–400)
RBC: 4.99 MIL/uL (ref 4.22–5.81)
RDW: 12.3 % (ref 11.5–15.5)
WBC: 8.6 10*3/uL (ref 4.0–10.5)
nRBC: 0 % (ref 0.0–0.2)

## 2019-06-08 LAB — BASIC METABOLIC PANEL
Anion gap: 8 (ref 5–15)
BUN: 11 mg/dL (ref 8–23)
CO2: 23 mmol/L (ref 22–32)
Calcium: 8.9 mg/dL (ref 8.9–10.3)
Chloride: 107 mmol/L (ref 98–111)
Creatinine, Ser: 0.86 mg/dL (ref 0.61–1.24)
GFR calc Af Amer: >60 mL/min (ref >60)
GFR calc non Af Amer: >60 mL/min (ref >60)
Glucose, Bld: 94 mg/dL (ref 70–99)
Potassium: 3.9 mmol/L (ref 3.5–5.1)
Sodium: 138 mmol/L (ref 135–145)

## 2019-06-08 MED ORDER — METOPROLOL TARTRATE 25 MG PO TABS: ORAL_TABLET | ORAL | 3 refills | Status: DC

## 2019-06-08 NOTE — Progress Notes (Signed)
Progress Note  Patient Name: [name] Date of Encounter: 06/08/2019  Primary Cardiologist: Peter [name], MD   Subjective   Doing well this morning.  Denies chest pain.  No dyspnea.  He ambulated the halls last night without any exertional symptoms.  His only complaint this morning is mild right wrist soreness post cardiac catheterization through the right radial artery, however he denies any associated hand pain, numbness or tingling.  He has a 2+ radial pulse.  He is eager to go home today.  Inpatient Medications    Scheduled Meds: . amLODipine  10 mg Oral Daily  . aspirin  81 mg Oral Daily  . clopidogrel  75 mg Oral Daily  . enoxaparin (LOVENOX) injection  40 mg Subcutaneous Q24H  . losartan  25 mg Oral Daily  . metoprolol tartrate  12.5 mg Oral Daily  . metoprolol tartrate  25 mg Oral QHS  . multivitamin with minerals  1 tablet Oral Daily  . sodium chloride flush  3 mL Intravenous Once  . sodium chloride flush  3 mL Intravenous Q12H  . sodium chloride flush  3 mL Intravenous Q12H  . sodium chloride flush  3 mL Intravenous Q12H   Continuous Infusions: . sodium chloride    . sodium chloride    . nitroGLYCERIN Stopped (06/07/19 0340)   PRN Meds: sodium chloride, sodium chloride, acetaminophen, ALPRAZolam, morphine injection, nitroGLYCERIN, ondansetron (ZOFRAN) IV, sodium chloride flush, sodium chloride flush, zolpidem   Vital Signs    Vitals:   06/07/19 1757 06/07/19 2000 06/07/19 2101 06/08/19 0459  BP: (!) 152/85  140/77 135/72  Pulse:  76 78 88  Resp:   15 15  Temp:   99.8 F (37.7 C) 98.4 F (36.9 C)  TempSrc:   Oral Oral  SpO2:  95% 95% 96%  Weight:    78.9 kg  Height:        Intake/Output Summary (Last 24 hours) at 06/08/2019 0730 Last data filed at 06/07/2019 2127 Gross per 24 hour  Intake 881.23 ml  Output 1600 ml  Net -718.77 ml   Last 3 Weights 06/08/2019 06/07/2019 06/05/2019  Weight (lbs) 173 lb 14.4 oz 177 lb 9.6 oz 182 lb  Weight (kg)  78.881 kg 80.559 kg 82.555 kg      Telemetry    Normal sinus rhythm.  Heart rate 65 bpm.- Personally Reviewed  ECG    NSR 71 bpm, LAD, no ST/ TW abnormalties - Personally Reviewed  Physical Exam   GEN: No acute distress.   Neck: No JVD Cardiac: RRR, no murmurs, rubs, or gallops.  Respiratory: Clear to auscultation bilaterally. GI: Soft, nontender, non-distended  MS: No edema; No deformity.  2+ radial pulse on the right Neuro:  Nonfocal  Psych: Normal affect   Labs    High Sensitivity Troponin:   Recent Labs  Lab 06/06/19 1533 06/06/19 1805 06/07/19 0135  TROPONINIHS 30* 26* 24*      Chemistry Recent Labs  Lab 06/06/19 1533 06/07/19 0135 06/08/19 0441  NA 141 140 138  K 4.3 4.0 3.9  CL 106 107 107  CO2 '25 24 23  ' GLUCOSE 96 105* 94  BUN '12 12 11  ' CREATININE 0.81 0.86 0.86  CALCIUM 9.1 8.6* 8.9  PROT  --  5.6*  --   ALBUMIN  --  3.6  --   AST  --  18  --   ALT  --  20  --   ALKPHOS  --  70  --  BILITOT  --  0.9  --   GFRNONAA >60 >60 >60  GFRAA >60 >60 >60  ANIONGAP '10 9 8     ' Hematology Recent Labs  Lab 06/06/19 1533 06/07/19 0135 06/08/19 0441  WBC 7.2 8.7 8.6  RBC 5.31 4.66 4.99  HGB 16.6 14.4 15.4  HCT 49.4 43.0 45.2  MCV 93.0 92.3 90.6  MCH 31.3 30.9 30.9  MCHC 33.6 33.5 34.1  RDW 12.5 12.6 12.3  PLT 251 230 221    BNPNo results for input(s): BNP, PROBNP in the last 168 hours.   DDimer No results for input(s): DDIMER in the last 168 hours.   Radiology    Dg Chest 2 View  Result Date: 06/06/2019 CLINICAL DATA:  69 year old male with a history of chest pain EXAM: CHEST - 2 VIEW COMPARISON:  December 28, 2017 FINDINGS: Cardiomediastinal silhouette unchanged in size and contour. No evidence of central vascular congestion. No pneumothorax or pleural effusion. No confluent airspace disease. No displaced fracture identified. Evidence of prior PTC I IMPRESSION: Negative for acute cardiopulmonary disease. Evidence of prior PTCI Electronically  Signed   By: Corrie Mckusick D.O.   On: 06/06/2019 16:54    Cardiac Studies   Cath: 06/07/19   Ost LAD to Prox LAD lesion is 40% stenosed.  Non-stenotic Prox Cx-1 lesion was previously treated.  Previously placed Prox Cx-2 drug eluting stent is widely patent.  Balloon angioplasty was performed.  Previously placed Mid Cx drug eluting stent is widely patent.  Balloon angioplasty was performed.  Non-stenotic Ost RPDA to RPDA lesion was previously treated.  RPAV lesion is 30% stenosed.  Prox RCA lesion is 30% stenosed.  Prox LAD to Mid LAD lesion is 70% stenosed.  Post intervention, there is a 0% residual stenosis.  A drug-eluting stent was successfully placed using a STENT RESOLUTE ONYX 3.0X30.  The left ventricular systolic function is normal.  LV end diastolic pressure is normal.  The left ventricular ejection fraction is 55-65% by visual estimate.  1. Widely patent left circumflex and RCA stents. There is a stent in the proximal LAD which is patent with moderate in-stent restenosis in the proximal segment which was not significant by DFR (0.94). In addition, the LAD has 70% stenosis in the midsegment distal to the previously placed stent. This was significant by DFR (0.85) and thus this was treated with PCI and drug-eluting stent placement. The stent overlapped with the previously placed proximal stent. 2. Normal LV systolic function and left ventricular end-diastolic pressure.  Recommendations: Dual antiplatelet therapy for at least 6 months and preferably long-term given multiple overlapped stents. Recommend referral to the lipid clinic for treatment of hyperlipidemia.  Diagnostic Dominance: Right  Intervention     Patient Profile     68 y.o. male of CAD, HTN, HLD (intolerant of statins), sinus bradycardia,history of provoked DVT, PVCs on previous 48-hour monitor, mild carotid artery disease and prostate cancer who recently underwent PCI to the Lcx  (9/9) and presented to the ED with recurrent chest pain.   Assessment & Plan    1. CAD w/ Unstable Angina: underwent cardiac cath 9/11>>> LAD had 70% stenosis in the midsegment distal to the previously placed stent. DFR was significant at (0.85) and LAD treated with PCI/DESx1. Previously placed Lcx was widely patent from 9/9.   Angina resolved.  Chest pain-free.  No exertional symptoms with ambulation.  Will plan to continue DAPT with ASA/plavix for at least 6 months.   Continue metoprolol 25 bid  Continue  amlodipine 10 daily  Continue Losartan 25 daily  Not on statin due to intolerance  Will prescribe PRN SL NTG at d/c  Radial cath site stable with 2+ radial pulse.  Ambulating without difficulty.  Vital signs, H&H and renal function stable.  Recommend outpatient cardiac rehab   2. HTN: controlled on current regimen. 135/72  Continue meds per above  3. HLD: hx of statin intolerance. Last LP in 2019 showed elevated LDL at 128. LDL goal given CAD < 70 mg/dL.  Plan for Lipid clinic referral for PCSK9i therapy.   Dispo: d/c home today f/u with Dr. [name] or APP in 1-2 weeks. F/u in Lipid clinic.    For questions or updates, please contact [place] Please consult [email or web address] for contact info under        Signed, Lyda Jester, PA-C  06/08/2019, 7:30 AM

## 2019-06-08 NOTE — Progress Notes (Signed)
Patient discharge paperwork gone over in detail with patient and his wife. All medications and site care instructions gone over in detail and all pt's questions answered to patient and wife's satisfaction. IV's removed and telemetry discontinued. Patient discharged to home with wife by way of wheelchair. VSS.  Lucius Conn, RN

## 2019-06-08 NOTE — Discharge Instructions (Signed)
Heart-Healthy Eating Plan Heart-healthy meal planning includes:  Eating less unhealthy fats.  Eating more healthy fats.  Making other changes in your diet. Talk with your doctor or a diet specialist (dietitian) to create an eating plan that is right for you. What is my plan? Your doctor may recommend an eating plan that includes:  Total fat: ______% or less of total calories a day.  Saturated fat: ______% or less of total calories a day.  Cholesterol: less than _________mg a day. What are tips for following this plan? Cooking Avoid frying your food. Try to bake, boil, grill, or broil it instead. You can also reduce fat by:  Removing the skin from poultry.  Removing all visible fats from meats.  Steaming vegetables in water or broth. Meal planning   At meals, divide your plate into four equal parts: ? Fill one-half of your plate with vegetables and green salads. ? Fill one-fourth of your plate with whole grains. ? Fill one-fourth of your plate with lean protein foods.  Eat 4-5 servings of vegetables per day. A serving of vegetables is: ? 1 cup of raw or cooked vegetables. ? 2 cups of raw leafy greens.  Eat 4-5 servings of fruit per day. A serving of fruit is: ? 1 medium whole fruit. ?  cup of dried fruit. ?  cup of fresh, frozen, or canned fruit. ?  cup of 100% fruit juice.  Eat more foods that have soluble fiber. These are apples, broccoli, carrots, beans, peas, and barley. Try to get 20-30 g of fiber per day.  Eat 4-5 servings of nuts, legumes, and seeds per week: ? 1 serving of dried beans or legumes equals  cup after being cooked. ? 1 serving of nuts is  cup. ? 1 serving of seeds equals 1 tablespoon. General information  Eat more home-cooked food. Eat less restaurant, buffet, and fast food.  Limit or avoid alcohol.  Limit foods that are high in starch and sugar.  Avoid fried foods.  Lose weight if you are overweight.  Keep track of how much salt  (sodium) you eat. This is important if you have high blood pressure. Ask your doctor to tell you more about this.  Try to add vegetarian meals each week. Fats  Choose healthy fats. These include olive oil and canola oil, flaxseeds, walnuts, almonds, and seeds.  Eat more omega-3 fats. These include salmon, mackerel, sardines, tuna, flaxseed oil, and ground flaxseeds. Try to eat fish at least 2 times each week.  Check food labels. Avoid foods with trans fats or high amounts of saturated fat.  Limit saturated fats. ? These are often found in animal products, such as meats, butter, and cream. ? These are also found in plant foods, such as palm oil, palm kernel oil, and coconut oil.  Avoid foods with partially hydrogenated oils in them. These have trans fats. Examples are stick margarine, some tub margarines, cookies, crackers, and other baked goods. What foods can I eat? Fruits All fresh, canned (in natural juice), or frozen fruits. Vegetables Fresh or frozen vegetables (raw, steamed, roasted, or grilled). Green salads. Grains Most grains. Choose whole wheat and whole grains most of the time. Rice and pasta, including brown rice and pastas made with whole wheat. Meats and other proteins Lean, well-trimmed beef, veal, pork, and lamb. Chicken and [place] without skin. All fish and shellfish. Wild duck, rabbit, pheasant, and venison. Egg whites or low-cholesterol egg substitutes. Dried beans, peas, lentils, and tofu. Seeds and most  nuts. °Dairy °Low-fat or nonfat cheeses, including ricotta and mozzarella. Skim or 1% milk that is liquid, powdered, or evaporated. Buttermilk that is made with low-fat milk. Nonfat or low-fat yogurt. °Fats and oils °Non-hydrogenated (trans-free) margarines. Vegetable oils, including soybean, sesame, sunflower, olive, peanut, safflower, corn, canola, and cottonseed. Salad dressings or mayonnaise made with a vegetable oil. °Beverages °Mineral water. Coffee and tea. Diet  carbonated beverages. °Sweets and desserts °Sherbet, gelatin, and fruit ice. Small amounts of dark chocolate. °Limit all sweets and desserts. °Seasonings and condiments °All seasonings and condiments. °The items listed above may not be a complete list of foods and drinks you can eat. Contact a dietitian for more options. °What foods should I avoid? °Fruits °Canned fruit in heavy syrup. Fruit in cream or butter sauce. Fried fruit. Limit coconut. °Vegetables °Vegetables cooked in cheese, cream, or butter sauce. Fried vegetables. °Grains °Breads that are made with saturated or trans fats, oils, or whole milk. Croissants. Sweet rolls. Donuts. High-fat crackers, such as cheese crackers. °Meats and other proteins °Fatty meats, such as hot dogs, ribs, sausage, bacon, rib-eye roast or steak. High-fat deli meats, such as salami and bologna. Caviar. Domestic duck and goose. Organ meats, such as liver. °Dairy °Cream, sour cream, cream cheese, and creamed cottage cheese. Whole-milk cheeses. Whole or 2% milk that is liquid, evaporated, or condensed. Whole buttermilk. Cream sauce or high-fat cheese sauce. Yogurt that is made from whole milk. °Fats and oils °Meat fat, or shortening. Cocoa butter, hydrogenated oils, palm oil, coconut oil, palm kernel oil. Solid fats and shortenings, including bacon fat, salt pork, lard, and butter. Nondairy cream substitutes. Salad dressings with cheese or sour cream. °Beverages °Regular sodas and juice drinks with added sugar. °Sweets and desserts °Frosting. Pudding. Cookies. Cakes. Pies. Milk chocolate or white chocolate. Buttered syrups. Full-fat ice cream or ice cream drinks. °The items listed above may not be a complete list of foods and drinks to avoid. Contact a dietitian for more information. °Summary °· Heart-healthy meal planning includes eating less unhealthy fats, eating more healthy fats, and making other changes in your diet. °· Eat a balanced diet. This includes fruits and  vegetables, low-fat or nonfat dairy, lean protein, nuts and legumes, whole grains, and heart-healthy oils and fats. °This information is not intended to replace advice given to you by your health care provider. Make sure you discuss any questions you have with your health care provider. °Document Released: 03/13/2012 Document Revised: 11/16/2017 Document Reviewed: 10/20/2017 °Elsevier Patient Education © 2020 [employer] ° ° °Radial Site Care ° °This sheet gives you information about how to care for yourself after your procedure. Your health care provider may also give you more specific instructions. If you have problems or questions, contact your health care provider. °What can I expect after the procedure? °After the procedure, it is common to have: °· Bruising and tenderness at the catheter insertion area. °Follow these instructions at home: °Medicines °· Take over-the-counter and prescription medicines only as told by your health care provider. °Insertion site care °· Follow instructions from your health care provider about how to take care of your insertion site. Make sure you: °? Wash your hands with soap and water before you change your bandage (dressing). If soap and water are not available, use hand sanitizer. °? Change your dressing as told by your health care provider. °? Leave stitches (sutures), skin glue, or adhesive strips in place. These skin closures may need to stay in place for 2 weeks or longer.   If adhesive strip edges start to loosen and curl up, you may trim the loose edges. Do not remove adhesive strips completely unless your health care provider tells you to do that. °· Check your insertion site every day for signs of infection. Check for: °? Redness, swelling, or pain. °? Fluid or blood. °? Pus or a bad smell. °? Warmth. °· Do not take baths, swim, or use a hot tub until your health care provider approves. °· You may shower 24-48 hours after the procedure, or as directed by your health  care provider. °? Remove the dressing and gently wash the site with plain soap and water. °? Pat the area dry with a clean towel. °? Do not rub the site. That could cause bleeding. °· Do not apply powder or lotion to the site. °Activity ° °· For 24 hours after the procedure, or as directed by your health care provider: °? Do not flex or bend the affected arm. °? Do not push or pull heavy objects with the affected arm. °? Do not drive yourself home from the hospital or clinic. You may drive 24 hours after the procedure unless your health care provider tells you not to. °? Do not operate machinery or power tools. °· Do not lift anything that is heavier than 10 lb (4.5 kg), or the limit that you are told, until your health care provider says that it is safe. °· Ask your health care provider when it is okay to: °? Return to work or school. °? Resume usual physical activities or sports. °? Resume sexual activity. °General instructions °· If the catheter site starts to bleed, raise your arm and put firm pressure on the site. If the bleeding does not stop, get help right away. This is a medical emergency. °· If you went home on the same day as your procedure, a responsible adult should be with you for the first 24 hours after you arrive home. °· Keep all follow-up visits as told by your health care provider. This is important. °Contact a health care provider if: °· You have a fever. °· You have redness, swelling, or yellow drainage around your insertion site. °Get help right away if: °· You have unusual pain at the radial site. °· The catheter insertion area swells very fast. °· The insertion area is bleeding, and the bleeding does not stop when you hold steady pressure on the area. °· Your arm or hand becomes pale, cool, tingly, or numb. °These symptoms may represent a serious problem that is an emergency. Do not wait to see if the symptoms will go away. Get medical help right away. Call your local emergency services (911  in the [place] Do not drive yourself to the hospital. °Summary °· After the procedure, it is common to have bruising and tenderness at the site. °· Follow instructions from your health care provider about how to take care of your radial site wound. Check the wound every day for signs of infection. °· Do not lift anything that is heavier than 10 lb (4.5 kg), or the limit that you are told, until your health care provider says that it is safe. °This information is not intended to replace advice given to you by your health care provider. Make sure you discuss any questions you have with your health care provider. °Document Released: 10/15/2010 Document Revised: 10/18/2017 Document Reviewed: 10/18/2017 °Elsevier Patient Education © 2020 [employer] ° ° °

## 2019-06-08 NOTE — Progress Notes (Signed)
CARDIAC REHAB PHASE I   PRE:  Rate/Rhythm: 72 SR  BP:  Supine:   Sitting: 126/81  Standing:    SaO2: 95% RA  MODE:  Ambulation: 430 ft   POST:  Rate/Rhythm: 86 SR  BP:  Supine:   Sitting: 123/80  Standing:    SaO2: 96% RA  [id number] Patient tolerated ambulation well with assist x1, gait steady, no symptoms. Vital signs stable before and after walk.  [name]

## 2019-06-08 NOTE — Discharge Summary (Signed)
Discharge Summary    Patient ID: [name] MRN: [id number]; DOB: Jun 18, 1951  Admit date: 06/06/2019 Discharge date: 06/08/2019  Primary Care Provider: [name], MD  Primary Cardiologist: Peter [name], MD  Primary Electrophysiologist:  None  Discharge Diagnoses    Active Problems:   CAD S/P percutaneous coronary angioplasty   Allergies Allergies  Allergen Reactions  . Adhesive [Tape] Hives, Itching and Other (See Comments)    Hives  And itching from the adhesive on the pads of the heart monitor   . Demerol [Meperidine] Nausea Only and Other (See Comments)    Drop in blood pressure, Muscle spasm  . Fentanyl Nausea Only and Other (See Comments)    Drop in blood pressure, Muscle spasm  . Statins Other (See Comments)    Myalgias     Diagnostic Studies/Procedures    Cath: 06/07/19   Ost LAD to Prox LAD lesion is 40% stenosed.  Non-stenotic Prox Cx-1 lesion was previously treated.  Previously placed Prox Cx-2 drug eluting stent is widely patent.  Balloon angioplasty was performed.  Previously placed Mid Cx drug eluting stent is widely patent.  Balloon angioplasty was performed.  Non-stenotic Ost RPDA to RPDA lesion was previously treated.  RPAV lesion is 30% stenosed.  Prox RCA lesion is 30% stenosed.  Prox LAD to Mid LAD lesion is 70% stenosed.  Post intervention, there is a 0% residual stenosis.  A drug-eluting stent was successfully placed using a STENT RESOLUTE ONYX 3.0X30.  The left ventricular systolic function is normal.  LV end diastolic pressure is normal.  The left ventricular ejection fraction is 55-65% by visual estimate.  1. Widely patent left circumflex and RCA stents. There is a stent in the proximal LAD which is patent with moderate in-stent restenosis in the proximal segment which was not significant by DFR (0.94). In addition, the LAD has 70% stenosis in the midsegment distal to the previously placed stent. This was  significant by DFR (0.85) and thus this was treated with PCI and drug-eluting stent placement. The stent overlapped with the previously placed proximal stent. 2. Normal LV systolic function and left ventricular end-diastolic pressure.  Recommendations: Dual antiplatelet therapy for at least 6 months and preferably long-term given multiple overlapped stents. Recommend referral to the lipid clinic for treatment of hyperlipidemia.  Diagnostic Dominance: Right  Intervention      History of Present Illness     68 y.o. male w/ h/o CAD, HTN, HLD (intolerant of statins), sinus bradycardia,history of provoked DVT, PVCs on previous 48-hour monitor, mild carotid artery disease and prostate cancer who recently underwent PCI to the Lcx (06/05/19) and presented to the ED, the very next day on 9/10,  with recurrent chest pain. He was admitted to r/o acute in-stent thrombosis and for re-look LHC.   Hospital Course     In the ED, blood pressure was elevated over his baseline with systolics in the 030S.  Cardiac exam revealed regular rate and rhythm without gallop or rub.  EKG showed no acute ST segment changes.  High-sensitivity troponin levels were mildly elevated but flat, 30>>> 26.  Potassium 4.3, BUN 12, creatinine 0.81, hemoglobin 16.6, platelets 251.    He was started on IV heparin and IV nitroglycerin.  Cardiac enzymes were cycled overnight with no significant increase to suggest acute in-stent thrombosis.  On 06/07/2019, he underwent relook cardiac catheterization.  The previous placed left circumflex stent was widely patent.  The LAD had 70% stenosis in the mid segment distal to the  previously placed stent.  DFR was significant at 0.85.  The lesion was treated with PCI/drug-eluting stent x1.    He tolerated the procedure well and left the Cath Lab in stable condition.  Dual antiplatelet therapy with aspirin and Plavix was continued.  Metoprolol and amlodipine were continued.  He was also  continued on losartan 25.  As noted above, he is not on statin therapy due to history of intolerance.  Plan is refer to lipid clinic for consideration for PCSK9 inhibitor therapy.  Patient was monitored overnight and had no post cardiac catheterization complications.  His right radial cath site remained stable with 2+ radial pulse.  He ambulated without difficulty or exertional symptoms.  His vital signs, renal function and H&H remained stable post cardiac catheterization.  Patient was last seen and examined by Dr. [name] who felt that he was stable for discharge home.  Patient will follow-up with Dr. [name] or APP in 1 to 2 weeks.  Patient will be referred to lipid clinic.  Consultants: none    Discharge Vitals Blood pressure 135/72, pulse 88, temperature 98.4 F (36.9 C), temperature source Oral, resp. rate 15, height 5' 7" (1.702 m), weight 78.9 kg, SpO2 96 %.  Filed Weights   06/07/19 0054 06/08/19 0459  Weight: 80.6 kg 78.9 kg    Labs & Radiologic Studies    CBC Recent Labs    06/07/19 0135 06/08/19 0441  WBC 8.7 8.6  HGB 14.4 15.4  HCT 43.0 45.2  MCV 92.3 90.6  PLT 230 734   Basic Metabolic Panel Recent Labs    06/07/19 0135 06/08/19 0441  NA 140 138  K 4.0 3.9  CL 107 107  CO2 24 23  GLUCOSE 105* 94  BUN 12 11  CREATININE 0.86 0.86  CALCIUM 8.6* 8.9   Liver Function Tests Recent Labs    06/07/19 0135  AST 18  ALT 20  ALKPHOS 70  BILITOT 0.9  PROT 5.6*  ALBUMIN 3.6   No results for input(s): LIPASE, AMYLASE in the last 72 hours. High Sensitivity Troponin:   Recent Labs  Lab 06/06/19 1533 06/06/19 1805 06/07/19 0135  TROPONINIHS 30* 26* 24*    BNP Invalid input(s): POCBNP D-Dimer No results for input(s): DDIMER in the last 72 hours. Hemoglobin A1C No results for input(s): HGBA1C in the last 72 hours. Fasting Lipid Panel No results for input(s): CHOL, HDL, LDLCALC, TRIG, CHOLHDL, LDLDIRECT in the last 72 hours. Thyroid Function Tests No  results for input(s): TSH, T4TOTAL, T3FREE, THYROIDAB in the last 72 hours.  Invalid input(s): FREET3 _____________  Dg Chest 2 View  Result Date: 06/06/2019 CLINICAL DATA:  68 year old male with a history of chest pain EXAM: CHEST - 2 VIEW COMPARISON:  December 28, 2017 FINDINGS: Cardiomediastinal silhouette unchanged in size and contour. No evidence of central vascular congestion. No pneumothorax or pleural effusion. No confluent airspace disease. No displaced fracture identified. Evidence of prior PTC I IMPRESSION: Negative for acute cardiopulmonary disease. Evidence of prior PTCI Electronically Signed   By: Corrie Mckusick D.O.   On: 06/06/2019 16:54   Disposition   Pt is being discharged home today in good condition.  Follow-up Plans & Appointments    Follow-up Information    [name], Peter M, MD Follow up on 06/11/2019.   Specialty: Cardiology Why: 2:40 PM  Contact information: [address] [place] [address] [phone]          Discharge Instructions    AMB Referral to Cardiac  Rehabilitation - Phase II   Complete by: As directed    Diagnosis: Coronary Stents   After initial evaluation and assessments completed: Virtual Based Care may be provided alone or in conjunction with Phase 2 Cardiac Rehab based on patient barriers.: Yes      Discharge Medications   Allergies as of 06/08/2019      Reactions   Adhesive [tape] Hives, Itching, Other (See Comments)   Hives  And itching from the adhesive on the pads of the heart monitor    Demerol [meperidine] Nausea Only, Other (See Comments)   Drop in blood pressure, Muscle spasm   Fentanyl Nausea Only, Other (See Comments)   Drop in blood pressure, Muscle spasm   Statins Other (See Comments)   Myalgias      Medication List    TAKE these medications   amLODipine 10 MG tablet Commonly known as: NORVASC Take 1 tablet (10 mg total) by mouth daily.   aspirin 81 MG tablet Take 81 mg by mouth daily.    clopidogrel 75 MG tablet Commonly known as: PLAVIX Take 1 tablet (75 mg total) by mouth daily.   Co Q-10 100 MG Caps Take 100 mg by mouth daily.   losartan 25 MG tablet Commonly known as: COZAAR Take 1 tablet (25 mg total) by mouth daily.   metoprolol tartrate 25 MG tablet Commonly known as: LOPRESSOR Take 1 tablet (25 mg total) by mouth 2 (two) times daily AND 1 tablet (25 mg total) every evening. What changed: See the new instructions.   multivitamin with minerals Tabs tablet Take 1 tablet by mouth daily.   nitroGLYCERIN 0.4 MG SL tablet Commonly known as: NITROSTAT Place 1 tablet (0.4 mg total) under the tongue every 5 (five) minutes as needed for chest pain. What changed: when to take this        Acute coronary syndrome (MI, NSTEMI, STEMI, etc) this admission?: No.    Outstanding Labs/Studies   Lipid panel >>Lipid clinic referral   Duration of Discharge Encounter   Greater than 30 minutes including physician time.  Signed, Lyda Jester, PA-C 06/08/2019, 9:08 AM

## 2019-06-09 NOTE — Progress Notes (Signed)
Cardiology Office Note    Date:  06/11/2019   ID:  [name], DOB 03-06-1951, MRN [id number]  PCP:  [name], MD  Cardiologist:  Dr. [name]  Chief Complaint  Patient presents with  . Coronary Artery Disease    History of Present Illness:  [name] is a 68 y.o. male with PMH of CAD, HTN, HLD (intolerant of statins), sinus bradycardia, history of provoked DVT, PVCs on previous 48-hour monitor, mild carotid artery disease and prostate cancer.  Previous cardiac catheterization performed in the setting of an STEMI on 09/21/2017 showed patent LAD stent with 50% mid LAD lesion, 60% mid left circumflex lesion, and 60% PDA lesion.  PDA lesion is unchanged from 2012.  Left circumflex lesion was significant by FFR and he underwent PCI with DES.  Later that night he had recurrent chest pain and was taken back to the Cath Lab.  Cardiac catheterization revealed patent left circumflex stent with dissection into the OM 3.  Medical therapy was planned as any reattempt with crossing with a wire could lead to worsening dissection.  Due to persistent symptoms, he underwent repeat cardiac cath on 01/11/2018, that showed widely patent stents, the wire dissection had healed completely, EF was normal at the time.  He underwent repeat cardiac cath on 08/03/2018 due to exertional chest discomfort.  This showed new restenosis in the left circumflex at the proximal stent margin, there was also progression of disease in the PDA.  Both side were treated with DES.  He was enrolled in [place] 4 trial.  Based on the last note in February 2020, he did not wish to continue in [place] 4 trial due to myalgia after injection.  He was last seen by me on 08/22/2018 at which time he was doing well.  He was seen in early September  recurrent exertional chest tightness and dyspnea since August 7th.  No resting chest pain. He underwent repeat cardiac cath on 06/05/19 showing a new stenosis in the LCx just distal to prior stent.  This was treated with repeat DES. Following cath he had persistent chests pain and was actually readmitted. Ecg was nonacute. Troponin was not c/w ACS. He did undergo repeat cath showing patent stents. There was moderate mid LAD disease that was chronic and unchanged from prior studies. He did have an abnormal Flow wire assessment so this was stented as well. DC home on 06/08/19. Amlodipine dose increased for BP.    He states that after he went home he felt terrible for 2 days. Had a bad HA and was so weak that he didn't have strength to walk. He has felt much better since then. Chest pain has resolved. Does feel nervous and shaky. Reviewing his medication there are discrepancies in his metoprolol dose. Amlodipine was increased from 2.5 to 10 mg in the span of 2 days.    Past Medical History:  Diagnosis Date  . Abnormality of aortic valve    a. possibly bicuspid.  . Asthma    Childhood  . Carotid artery disease ([place])    a. 1-39% bilaterally in 2017.  Marland Kitchen Coronary artery disease    a. anterior STEMI 04/2011, s/p DES to LAD.  Marland Kitchen DVT (deep venous thrombosis) (HCC)    a. tx with Xarelto x 5 months, stopped due to hematuria.  . Frequent PVCs    a. seen on Holter monitor.  . Hyperlipidemia, mixed   . Hypertension   . Myocardial infarction ([place]) 05/21/2011   anterior  wall, treated by Dr. [name] with 3.0x68mm Promus drug eluting stent to proximal LAD   . Peripheral arterial disease (HCC)    moderate left ICA disease, mild right ICA disease  . Prostate cancer ([place]) 2007  . Sinus bradycardia    a. HR 50s at home.  . Statin intolerance   . Unstable angina ([place]) 08/03/2018    Past Surgical History:  Procedure Laterality Date  . CARDIAC CATHETERIZATION  05/23/2011, 05/21/2011   05/21/2011   3.0 x 32mm Promus Element drug eluting stent positioned in proximal LAD, stenosis taken from 99% to 0%.  . CORONARY STENT INTERVENTION N/A 09/21/2017   Procedure: CORONARY STENT INTERVENTION;  Surgeon:  [name], MD;  Location: [facility];  Service: Cardiovascular;  Laterality: N/A;  . CORONARY STENT INTERVENTION N/A 08/03/2018   Procedure: CORONARY STENT INTERVENTION;  Surgeon: [name], Ido Wollman M, MD;  Location: [facility];  Service: Cardiovascular;  Laterality: N/A;  . CORONARY STENT INTERVENTION N/A 06/05/2019   Procedure: CORONARY STENT INTERVENTION;  Surgeon: [name], Morning Halberg M, MD;  Location: [facility];  Service: Cardiovascular;  Laterality: N/A;  . CORONARY STENT INTERVENTION N/A 06/07/2019   Procedure: CORONARY STENT INTERVENTION;  Surgeon: Wellington Hampshire, MD;  Location: [facility];  Service: Cardiovascular;  Laterality: N/A;  . CORONARY/GRAFT ACUTE MI REVASCULARIZATION N/A 09/21/2017   Procedure: Coronary/Graft Acute MI Revascularization;  Surgeon: [name], Sabine Tenenbaum M, MD;  Location: [facility];  Service: Cardiovascular;  Laterality: N/A;  . HERNIA REPAIR    . INTRAVASCULAR PRESSURE WIRE/FFR STUDY N/A 09/21/2017   Procedure: INTRAVASCULAR PRESSURE WIRE/FFR STUDY;  Surgeon: [name], MD;  Location: [facility];  Service: Cardiovascular;  Laterality: N/A;  . INTRAVASCULAR PRESSURE WIRE/FFR STUDY N/A 06/07/2019   Procedure: INTRAVASCULAR PRESSURE WIRE/FFR STUDY;  Surgeon: Wellington Hampshire, MD;  Location: [facility];  Service: Cardiovascular;  Laterality: N/A;  . LEFT HEART CATH AND CORONARY ANGIOGRAPHY N/A 09/21/2017   Procedure: LEFT HEART CATH AND CORONARY ANGIOGRAPHY;  Surgeon: [name], Amiah Frohlich M, MD;  Location: [facility];  Service: Cardiovascular;  Laterality: N/A;  . LEFT HEART CATH AND CORONARY ANGIOGRAPHY N/A 09/21/2017   Procedure: LEFT HEART CATH AND CORONARY ANGIOGRAPHY;  Surgeon: [name], MD;  Location: [facility];  Service: Cardiovascular;  Laterality: N/A;  . LEFT HEART CATH AND CORONARY ANGIOGRAPHY N/A 01/11/2018   Procedure: LEFT HEART CATH AND CORONARY ANGIOGRAPHY;  Surgeon: [name], Maddelynn Moosman  M, MD;  Location: [facility];  Service: Cardiovascular;  Laterality: N/A;  . LEFT HEART CATH AND CORONARY ANGIOGRAPHY N/A 08/03/2018   Procedure: LEFT HEART CATH AND CORONARY ANGIOGRAPHY;  Surgeon: [name], Lyman Balingit M, MD;  Location: [facility];  Service: Cardiovascular;  Laterality: N/A;  . LEFT HEART CATH AND CORONARY ANGIOGRAPHY N/A 06/05/2019   Procedure: LEFT HEART CATH AND CORONARY ANGIOGRAPHY;  Surgeon: [name], Verlon Pischke M, MD;  Location: [facility];  Service: Cardiovascular;  Laterality: N/A;  . LEFT HEART CATH AND CORONARY ANGIOGRAPHY N/A 06/07/2019   Procedure: LEFT HEART CATH AND CORONARY ANGIOGRAPHY;  Surgeon: Wellington Hampshire, MD;  Location: [facility];  Service: Cardiovascular;  Laterality: N/A;  . PROSTATECTOMY  2007    Current Medications: Outpatient Medications Prior to Visit  Medication Sig Dispense Refill  . aspirin 81 MG tablet Take 81 mg by mouth daily.     . clopidogrel (PLAVIX) 75 MG tablet Take 1 tablet (  75 mg total) by mouth daily. 90 tablet 3  . Coenzyme Q10 (CO Q-10) 100 MG CAPS Take 100 mg by mouth daily.     Marland Kitchen losartan (COZAAR) 25 MG tablet Take 1 tablet (25 mg total) by mouth daily. 90 tablet 3  . metoprolol tartrate (LOPRESSOR) 25 MG tablet Take 1 tablet (25 mg total) by mouth 2 (two) times daily AND 1 tablet (25 mg total) every evening. 135 tablet 3  . Multiple Vitamin (MULTIVITAMIN WITH MINERALS) TABS tablet Take 1 tablet by mouth daily.    . nitroGLYCERIN (NITROSTAT) 0.4 MG SL tablet Place 1 tablet (0.4 mg total) under the tongue every 5 (five) minutes as needed for chest pain. (Patient taking differently: Place 0.4 mg under the tongue every 5 (five) minutes x 3 doses as needed for chest pain. ) 25 tablet 3  . amLODipine (NORVASC) 10 MG tablet Take 1 tablet (10 mg total) by mouth daily. 90 tablet 3   No facility-administered medications prior to visit.      Allergies:   Adhesive [tape], Demerol [meperidine], Fentanyl, and Statins    Social History   Socioeconomic History  . Marital status: Married    Spouse name: Not on file  . Number of children: 0  . Years of education: Not on file  . Highest education level: Not on file  Occupational History  . Not on file  Social Needs  . Financial resource strain: Not on file  . Food insecurity    Worry: Not on file    Inability: Not on file  . Transportation needs    Medical: Not on file    Non-medical: Not on file  Tobacco Use  . Smoking status: Former Smoker    Packs/day: 1.00    Years: 7.00    Pack years: 7.00    Types: Cigarettes    Start date: 03/05/1970    Quit date: 09/26/1976    Years since quitting: 42.7  . Smokeless tobacco: Never Used  Substance and Sexual Activity  . Alcohol use: No    Alcohol/week: 0.0 standard drinks  . Drug use: No  . Sexual activity: Never  Lifestyle  . Physical activity    Days per week: Not on file    Minutes per session: Not on file  . Stress: Not on file  Relationships  . Social Herbalist on phone: Not on file    Gets together: Not on file    Attends religious service: Not on file    Active member of club or organization: Not on file    Attends meetings of clubs or organizations: Not on file    Relationship status: Not on file  Other Topics Concern  . Not on file  Social History Narrative  . Not on file     Family History:  The patient's family history includes Heart failure in his father; Leukemia in his mother.   ROS:   Please see the history of present illness.    ROS All other systems reviewed and are negative.   PHYSICAL EXAM:   VS:  BP (!) 150/82   Pulse 69   Ht 5\' 7"  (1.702 m)   Wt 183 lb 9.6 oz (83.3 kg)   SpO2 96%   BMI 28.76 kg/m    GEN: Well nourished, well developed, in no acute distress  HEENT: normal  Neck: no JVD, carotid bruits, or masses Cardiac: RRR; no murmurs, rubs, or gallops,no edema  Respiratory:  clear to  auscultation bilaterally, normal work of breathing GI: soft,  nontender, nondistended, + BS MS: no deformity or atrophy, radial cath site without hematoma. Groin site with small knot. Good pulse. No bruit.  Skin: warm and dry, no rash Neuro:  Alert and Oriented x 3, Strength and sensation are intact Psych: euthymic mood, full affect  Wt Readings from Last 3 Encounters:  06/11/19 183 lb 9.6 oz (83.3 kg)  06/08/19 173 lb 14.4 oz (78.9 kg)  06/05/19 182 lb (82.6 kg)      Studies/Labs Reviewed:   EKG:  EKG is ordered today.  The ekg ordered today demonstrates sinus bradycardia without significant ST-T wave changes.  Recent Labs: 06/07/2019: ALT 20 06/08/2019: BUN 11; Creatinine, Ser 0.86; Hemoglobin 15.4; Platelets 221; Potassium 3.9; Sodium 138   Lipid Panel    Component Value Date/Time   CHOL 204 (H) 05/30/2019 0949   CHOL 152 03/22/2013 0813   TRIG 105 05/30/2019 0949   TRIG 83 03/22/2013 0813   HDL 42 05/30/2019 0949   HDL 39 (L) 03/22/2013 0813   CHOLHDL 4.9 05/30/2019 0949   CHOLHDL 5.5 12/28/2017 1027   VLDL 43 (H) 12/28/2017 1027   LDLCALC 128 (H) 12/28/2017 1027   LDLCALC 96 03/22/2013 0813    Additional studies/ records that were reviewed today include:   Cath 08/03/2018  Prox LAD lesion is 45% stenosed.  Ost RPDA to RPDA lesion is 80% stenosed.  A drug-eluting stent was successfully placed using a STENT SYNERGY DES 2.25X12.  Post intervention, there is a 0% residual stenosis.  Ost LAD to Prox LAD lesion is 35% stenosed.  Prox Cx-1 lesion is 95% stenosed.  Post intervention, there is a 0% residual stenosis.  A drug-eluting stent was successfully placed using a STENT SYNERGY DES 3X12.  Non-stenotic Prox Cx-2 lesion was previously treated.  Post Atrio lesion is 30% stenosed.  LV end diastolic pressure is normal.   1. 2 vessel obstructive CAD    - patent stent in the proximal LAD. Diffuse 45-50% mid LAD    - 95% stenosis in the LCx at the proximal stent margin    - 80% PDA 2. Normal LVEDP 3. Successful PCI of  the LCx with DES x 1 overlapping the prior stent 4. Successful PCI of the PDA with DES x 1.   Plan: Anticipate same day discharge.   Recommend uninterrupted dual antiplatelet therapy with Aspirin 81mg  daily and Clopidogrel 75 mg daily for a minimum of 12 months (ACS - Class I recommendation).  Cath 06/05/19:  CORONARY STENT INTERVENTION  LEFT HEART CATH AND CORONARY ANGIOGRAPHY  Conclusion    Prox LAD lesion is 45% stenosed.  Ost LAD to Prox LAD lesion is 40% stenosed.  Non-stenotic Prox Cx-2 lesion was previously treated.  Previously placed Prox Cx-1 drug eluting stent is widely patent.  Prox RCA lesion is 25% stenosed.  Previously placed Ost RPDA to RPDA drug eluting stent is widely patent.  RPAV lesion is 30% stenosed.  Mid Cx lesion is 90% stenosed.  A drug-eluting stent was successfully placed using a STENT RESOLUTE ONYX 2.5X15.  Post intervention, there is a 0% residual stenosis.  The left ventricular systolic function is normal.  LV end diastolic pressure is normal.  The left ventricular ejection fraction is 55-65% by visual estimate.   1. Single vessel obstructive CAD. 90% stenosis in the mid LCx distal to prior stents into the OM1 2. Continued stent patency in the LAD, PDA and proximal to mid LCx 3. Normal  LV function 4. Normal LVEDP 5. Successful PCI of the mid LCx/OM1 with DES x 1 in overlapping fashion  Plan: DAPT indefinitely. He is a candidate for same day discharge. Needs referral to lipid clinic to address his hypercholesterolemia and intolerance to statins. With aggressive CAD target LDL <50.     Cardiac cath 06/07/19:  CORONARY STENT INTERVENTION  INTRAVASCULAR PRESSURE WIRE/FFR STUDY  LEFT HEART CATH AND CORONARY ANGIOGRAPHY  Conclusion    Ost LAD to Prox LAD lesion is 40% stenosed.  Non-stenotic Prox Cx-1 lesion was previously treated.  Previously placed Prox Cx-2 drug eluting stent is widely patent.  Balloon angioplasty was  performed.  Previously placed Mid Cx drug eluting stent is widely patent.  Balloon angioplasty was performed.  Non-stenotic Ost RPDA to RPDA lesion was previously treated.  RPAV lesion is 30% stenosed.  Prox RCA lesion is 30% stenosed.  Prox LAD to Mid LAD lesion is 70% stenosed.  Post intervention, there is a 0% residual stenosis.  A drug-eluting stent was successfully placed using a STENT RESOLUTE ONYX 3.0X30.  The left ventricular systolic function is normal.  LV end diastolic pressure is normal.  The left ventricular ejection fraction is 55-65% by visual estimate.   1.  Widely patent left circumflex and RCA stents.  There is a stent in the proximal LAD which is patent with moderate in-stent restenosis in the proximal segment which was not significant by DFR (0.94).  In addition, the LAD has 70% stenosis in the midsegment distal to the previously placed stent.  This was significant by DFR (0.85) and thus this was treated with PCI and drug-eluting stent placement.  The stent overlapped with the previously placed proximal stent. 2.  Normal LV systolic function and left ventricular end-diastolic pressure.  Recommendations: Dual antiplatelet therapy for at least 6 months and preferably long-term given multiple overlapped stents. Recommend referral to the lipid clinic for treatment of hyperlipidemia.      ASSESSMENT:    1. CAD S/P percutaneous coronary angioplasty   2. Essential hypertension   3. Mixed hyperlipidemia      PLAN:  In order of problems listed above:  1. CAD s/p multiple prior stent procedures. Recent stent of LCx for stenosis. Continued chest pain of uncertain etiology. He had stenting of the mid LAD for abnormal FFR. Continue aspirin and Plavix indefinitely.    2. Hypertension: Blood pressure elevated. I have corrected metoprolol dose to 25 mg bid. Will reduce amlodipine to 5 mg daily- I think high dose was causing HA. If additional control needed I would  increase losartan. Will monitor BP for next 2-3 weeks and let me know.  3. Hyperlipidemia: He is not on any statin therapy. With multiple prior stent procedures and aggressive CAD he needs aggressive lipid lowering therapy. Refer to lipid clinic. Needs PCSK 9 inhibitor.     Medication Adjustments/Labs and Tests Ordered: Current medicines are reviewed at length with the patient today.  Concerns regarding medicines are outlined above.  Medication changes, Labs and Tests ordered today are listed in the Patient Instructions below. Patient Instructions  Change metoprolol to 25 mg twice a day  Decrease amlodipine to 5 mg daily  Monitor BP at home. Let me know what your readings are in 2-3 weeks.   Follow up in 3 months       Signed, Schneur Crowson [name], MD  06/11/2019 3:20 PM    [facility] [place], [place], [place]  [address] Phone: [phone];  Fax: [phone]

## 2019-06-11 ENCOUNTER — Encounter: Payer: Self-pay | Admitting: Cardiology

## 2019-06-11 ENCOUNTER — Ambulatory Visit (INDEPENDENT_AMBULATORY_CARE_PROVIDER_SITE_OTHER): Payer: PPO | Admitting: Cardiology

## 2019-06-11 ENCOUNTER — Other Ambulatory Visit: Payer: Self-pay

## 2019-06-11 VITALS — BP 150/82 | HR 69 | Ht 67.0 in | Wt 183.6 lb

## 2019-06-11 DIAGNOSIS — I2510 Atherosclerotic heart disease of native coronary artery without angina pectoris: Secondary | ICD-10-CM

## 2019-06-11 DIAGNOSIS — E782 Mixed hyperlipidemia: Secondary | ICD-10-CM | POA: Diagnosis not present

## 2019-06-11 DIAGNOSIS — Z9861 Coronary angioplasty status: Secondary | ICD-10-CM

## 2019-06-11 DIAGNOSIS — I10 Essential (primary) hypertension: Secondary | ICD-10-CM

## 2019-06-11 MED ORDER — AMLODIPINE BESYLATE 10 MG PO TABS: 5.0000 mg | ORAL_TABLET | Freq: Every day | ORAL | 3 refills | Status: DC

## 2019-06-11 MED ORDER — METOPROLOL TARTRATE 25 MG PO TABS: 25.0000 mg | ORAL_TABLET | Freq: Two times a day (BID) | ORAL | 3 refills | Status: DC

## 2019-06-11 NOTE — Patient Instructions (Signed)
Change metoprolol to 25 mg twice a day  Decrease amlodipine to 5 mg daily  Monitor BP at home. Let me know what your readings are in 2-3 weeks.   Follow up in 3 months

## 2019-06-13 ENCOUNTER — Telehealth: Payer: PPO | Admitting: Cardiology

## 2019-06-13 DIAGNOSIS — I10 Essential (primary) hypertension: Secondary | ICD-10-CM | POA: Diagnosis not present

## 2019-06-13 DIAGNOSIS — I2510 Atherosclerotic heart disease of native coronary artery without angina pectoris: Secondary | ICD-10-CM | POA: Diagnosis not present

## 2019-06-13 DIAGNOSIS — F419 Anxiety disorder, unspecified: Secondary | ICD-10-CM | POA: Diagnosis not present

## 2019-06-25 ENCOUNTER — Ambulatory Visit: Payer: PPO | Admitting: Physician Assistant

## 2019-06-27 ENCOUNTER — Telehealth: Payer: Self-pay

## 2019-06-27 ENCOUNTER — Ambulatory Visit (INDEPENDENT_AMBULATORY_CARE_PROVIDER_SITE_OTHER): Payer: PPO | Admitting: Pharmacist

## 2019-06-27 ENCOUNTER — Other Ambulatory Visit: Payer: Self-pay

## 2019-06-27 VITALS — BP 138/82 | HR 69 | Resp 15 | Ht 67.0 in | Wt 183.0 lb

## 2019-06-27 DIAGNOSIS — E782 Mixed hyperlipidemia: Secondary | ICD-10-CM

## 2019-06-27 MED ORDER — LOSARTAN POTASSIUM 50 MG PO TABS: 50.0000 mg | ORAL_TABLET | Freq: Every day | ORAL | 3 refills | Status: DC

## 2019-06-27 MED ORDER — ROSUVASTATIN CALCIUM 10 MG PO TABS: ORAL_TABLET | ORAL | 0 refills | Status: DC

## 2019-06-27 NOTE — Telephone Encounter (Signed)
Spoke to patient Dr.Jordan reviewed B/P readings.He advised he would like systolic B/P to be consistently less than 130.He advised to increase Losartan to 50 mg daily.Advised to check B/P daily and send another B/P diary in 2 weeks.

## 2019-06-27 NOTE — Progress Notes (Signed)
Patient ID: [name]                 DOB: 12/30/1950                    MRN: [id number]     HPI: [name] is a 68 y.o. male patient referred to lipid clinic by DR [name]. PMH is significant for mixed hyperlipidemia, CAD, STEMI (09/22/2011), PAD, hypertension, statin intolerance, sinus bradycardia, provoked DVT, carotid disease, and prostate cancer. Patient presents today for potential PCSK9i start.  Current Medications: none  Intolerances:  Pravastatin 40mg  daily Crestor daily (unknown dose)  LDL goal: 70mg /dL  Diet: balanced, mainly home cooked meals, avoid fried foods,mainly fish, fresh vegetables and fruits  Exercise: former runner, still exercise regularly  Family History: His father had coronary artery disease at age 55 and  underwent coronary revascularization.  His grandfather died from a heart  attack at age 33.  He is unaware of any other history of premature  cardiovascular disease  Social History: former smoker  Labs: 05/30/2019: CHO 204, TG 105, HDL 42, LDL-c 143  Past Medical History:  Diagnosis Date  . Abnormality of aortic valve    a. possibly bicuspid.  . Asthma    Childhood  . Carotid artery disease ([place])    a. 1-39% bilaterally in 2017.  Marland Kitchen Coronary artery disease    a. anterior STEMI 04/2011, s/p DES to LAD.  Marland Kitchen DVT (deep venous thrombosis) (HCC)    a. tx with Xarelto x 5 months, stopped due to hematuria.  . Frequent PVCs    a. seen on Holter monitor.  . Hyperlipidemia, mixed   . Hypertension   . Myocardial infarction ([place]) 05/21/2011   anterior wall, treated by Dr. [name] with 3.0x17mm Promus drug eluting stent to proximal LAD   . Peripheral arterial disease (HCC)    moderate left ICA disease, mild right ICA disease  . Prostate cancer ([place]) 2007  . Sinus bradycardia    a. HR 50s at home.  . Statin intolerance   . Unstable angina ([place]) 08/03/2018    Current Outpatient Medications on File Prior to Visit  Medication Sig Dispense  Refill  . amLODipine (NORVASC) 10 MG tablet Take 0.5 tablets (5 mg total) by mouth daily. 90 tablet 3  . aspirin 81 MG tablet Take 81 mg by mouth daily.     . clopidogrel (PLAVIX) 75 MG tablet Take 1 tablet (75 mg total) by mouth daily. 90 tablet 3  . Coenzyme Q10 (CO Q-10) 100 MG CAPS Take 100 mg by mouth daily.     . metoprolol tartrate (LOPRESSOR) 25 MG tablet Take 1 tablet (25 mg total) by mouth 2 (two) times daily AND 1 tablet (25 mg total) every evening. 135 tablet 3  . metoprolol tartrate (LOPRESSOR) 25 MG tablet Take 1 tablet (25 mg total) by mouth 2 (two) times daily. 180 tablet 3  . Multiple Vitamin (MULTIVITAMIN WITH MINERALS) TABS tablet Take 1 tablet by mouth daily.    . nitroGLYCERIN (NITROSTAT) 0.4 MG SL tablet Place 1 tablet (0.4 mg total) under the tongue every 5 (five) minutes as needed for chest pain. (Patient taking differently: Place 0.4 mg under the tongue every 5 (five) minutes x 3 doses as needed for chest pain. ) 25 tablet 3   No current facility-administered medications on file prior to visit.     Allergies  Allergen Reactions  . Adhesive [Tape] Hives, Itching and Other (See Comments)  Hives  And itching from the adhesive on the pads of the heart monitor   . Demerol [Meperidine] Nausea Only and Other (See Comments)    Drop in blood pressure, Muscle spasm  . Fentanyl Nausea Only and Other (See Comments)    Drop in blood pressure, Muscle spasm  . Statins Other (See Comments)    Myalgias     Mixed hyperlipidemia LDL remains above goal for secondary prevention. Patient developed muscle pain in the past while on statin therapy but reluctant to initiate injectable products (Repatha or Praluent). Nexletol and Nexlizet also discusses at during this visit.   Patient will prefer re-challenge with rosuvastatin. Will start at rosuvastatin 10mg  2x/week for 2 weeks, then increase to 3x/week. Plan to repeat fasting lipid panel in 2-3 months to re-assess therapy. May consider  adding ezetimibe to statin therapy if LDL not al goal during follow up.   Gagandeep Kossman Rodriguez-Guzman PharmD, BCPS, [place] [address] [place] [id number] 07/02/2019 1:16 PM

## 2019-06-27 NOTE — Patient Instructions (Addendum)
[facility] (pharmacist) [facility]/[place] [phone]  *START taking rosuvastatin 10mg  every Monday and Friday for 2 weeks, then increase to 10mg  Mondays, Wednesdays, and Fridays is tolerating* *Repeat fasting lipid panel in 2-3 months during office visit with Dr [name]*   High Cholesterol  High cholesterol is a condition in which the blood has high levels of a white, waxy, fat-like substance (cholesterol). The human body needs small amounts of cholesterol. The liver makes all the cholesterol that the body needs. Extra (excess) cholesterol comes from the food that we eat. Cholesterol is carried from the liver by the blood through the blood vessels. If you have high cholesterol, deposits (plaques) may build up on the walls of your blood vessels (arteries). Plaques make the arteries narrower and stiffer. Cholesterol plaques increase your risk for heart attack and stroke. Work with your health care provider to keep your cholesterol levels in a healthy range. What increases the risk? This condition is more likely to develop in people who:  Eat foods that are high in animal fat (saturated fat) or cholesterol.  Are overweight.  Are not getting enough exercise.  Have a family history of high cholesterol. What are the signs or symptoms? There are no symptoms of this condition. How is this diagnosed? This condition may be diagnosed from the results of a blood test.  If you are older than age 23, your health care provider may check your cholesterol every 4-6 years.  You may be checked more often if you already have high cholesterol or other risk factors for heart disease. The blood test for cholesterol measures:  "Bad" cholesterol (LDL cholesterol). This is the main type of cholesterol that causes heart disease. The desired level for LDL is less than 100.  "Good" cholesterol (HDL cholesterol). This type helps to protect against heart disease by cleaning the arteries and carrying the LDL away.  The desired level for HDL is 60 or higher.  Triglycerides. These are fats that the body can store or burn for energy. The desired number for triglycerides is lower than 150.  Total cholesterol. This is a measure of the total amount of cholesterol in your blood, including LDL cholesterol, HDL cholesterol, and triglycerides. A healthy number is less than 200. How is this treated? This condition is treated with diet changes, lifestyle changes, and medicines. Diet changes  This may include eating more whole grains, fruits, vegetables, nuts, and fish.  This may also include cutting back on red meat and foods that have a lot of added sugar. Lifestyle changes  Changes may include getting at least 40 minutes of aerobic exercise 3 times a week. Aerobic exercises include walking, biking, and swimming. Aerobic exercise along with a healthy diet can help you maintain a healthy weight.  Changes may also include quitting smoking. Medicines  Medicines are usually given if diet and lifestyle changes have failed to reduce your cholesterol to healthy levels.  Your health care provider may prescribe a statin medicine. Statin medicines have been shown to reduce cholesterol, which can reduce the risk of heart disease. Follow these instructions at home: Eating and drinking If told by your health care provider:  Eat chicken (without skin), fish, veal, shellfish, ground [place] breast, and round or loin cuts of red meat.  Do not eat fried foods or fatty meats, such as hot dogs and salami.  Eat plenty of fruits, such as apples.  Eat plenty of vegetables, such as broccoli, potatoes, and carrots.  Eat beans, peas, and lentils.  Eat  grains such as barley, rice, couscous, and bulgur wheat.  Eat pasta without cream sauces.  Use skim or nonfat milk, and eat low-fat or nonfat yogurt and cheeses.  Do not eat or drink whole milk, cream, ice cream, egg yolks, or hard cheeses.  Do not eat stick margarine or  tub margarines that contain trans fats (also called partially hydrogenated oils).  Do not eat saturated tropical oils, such as coconut oil and palm oil.  Do not eat cakes, cookies, crackers, or other baked goods that contain trans fats.  General instructions  Exercise as directed by your health care provider. Increase your activity level with activities such as gardening, walking, and taking the stairs.  Take over-the-counter and prescription medicines only as told by your health care provider.  Do not use any products that contain nicotine or tobacco, such as cigarettes and e-cigarettes. If you need help quitting, ask your health care provider.  Keep all follow-up visits as told by your health care provider. This is important. Contact a health care provider if:  You are struggling to maintain a healthy diet or weight.  You need help to start on an exercise program.  You need help to stop smoking. Get help right away if:  You have chest pain.  You have trouble breathing. This information is not intended to replace advice given to you by your health care provider. Make sure you discuss any questions you have with your health care provider. Document Released: 09/12/2005 Document Revised: 09/15/2017 Document Reviewed: 03/12/2016 Elsevier Patient Education  2020 [employer].

## 2019-07-02 ENCOUNTER — Encounter: Payer: Self-pay | Admitting: Pharmacist

## 2019-07-02 NOTE — Assessment & Plan Note (Signed)
LDL remains above goal for secondary prevention. Patient developed muscle pain in the past while on statin therapy but reluctant to initiate injectable products (Repatha or Praluent). Nexletol and Nexlizet also discusses at during this visit.   Patient will prefer re-challenge with rosuvastatin. Will start at rosuvastatin 10mg  2x/week for 2 weeks, then increase to 3x/week. Plan to repeat fasting lipid panel in 2-3 months to re-assess therapy. May consider adding ezetimibe to statin therapy if LDL not al goal during follow up.

## 2019-07-18 ENCOUNTER — Ambulatory Visit: Payer: PPO | Admitting: Cardiology

## 2019-08-12 ENCOUNTER — Telehealth: Payer: Self-pay

## 2019-08-12 ENCOUNTER — Other Ambulatory Visit: Payer: Self-pay

## 2019-08-12 DIAGNOSIS — E782 Mixed hyperlipidemia: Secondary | ICD-10-CM

## 2019-08-12 MED ORDER — ROSUVASTATIN CALCIUM 10 MG PO TABS: ORAL_TABLET | ORAL | 0 refills | Status: DC

## 2019-08-12 MED ORDER — LOSARTAN POTASSIUM 50 MG PO TABS: 50.0000 mg | ORAL_TABLET | Freq: Every day | ORAL | 3 refills | Status: DC

## 2019-08-12 NOTE — Telephone Encounter (Signed)
Spoke to patient Dr.Jordan's recommendation given.Advised I will send new Losartan prescription to [facility].Advised to call back if B/P remains elevated.

## 2019-08-12 NOTE — Addendum Note (Signed)
Addended by: Allean Found on: 08/12/2019 11:18 AM   Modules accepted: Orders

## 2019-08-12 NOTE — Addendum Note (Signed)
Addended by: Kathyrn Lass on: 08/12/2019 04:52 PM   Modules accepted: Orders

## 2019-08-12 NOTE — Telephone Encounter (Signed)
Called and spoke with pt's wife about rosuvastin. They stated that the pt seemed to be doing ok and haven't had any complaints. I instructed them to have the pt come in for labs and fast for them and that no appt is needed. Pt voiced understanding

## 2019-08-12 NOTE — Telephone Encounter (Signed)
Patient wants Dr [name] to know his BP average is 145/85. Don't want BP medication adjustment from pharmacist at this time.

## 2019-08-12 NOTE — Telephone Encounter (Signed)
Spoke with the pt and they stated legs hurting prior to taking the med but the med hasn't made them any worse or alleviated any of the pain.

## 2019-08-12 NOTE — Telephone Encounter (Signed)
Pt also stating bp is 145/85 and just has some questions.

## 2019-08-12 NOTE — Telephone Encounter (Signed)
I would recommend increasing losartan to 50 mg daily. We had previously reduced amlodipine due to HA.   Peter [name] MD, [facility]

## 2019-08-23 IMAGING — DX DG CHEST 2V
2 series · 2 of 2 positions shown · non-contrast
Comparison: 05/21/2011 and 07/04/2006

CLINICAL DATA: Chest pain and shortness of breath.  Cough.

EXAM:
CHEST  2 VIEW

[chest pa]
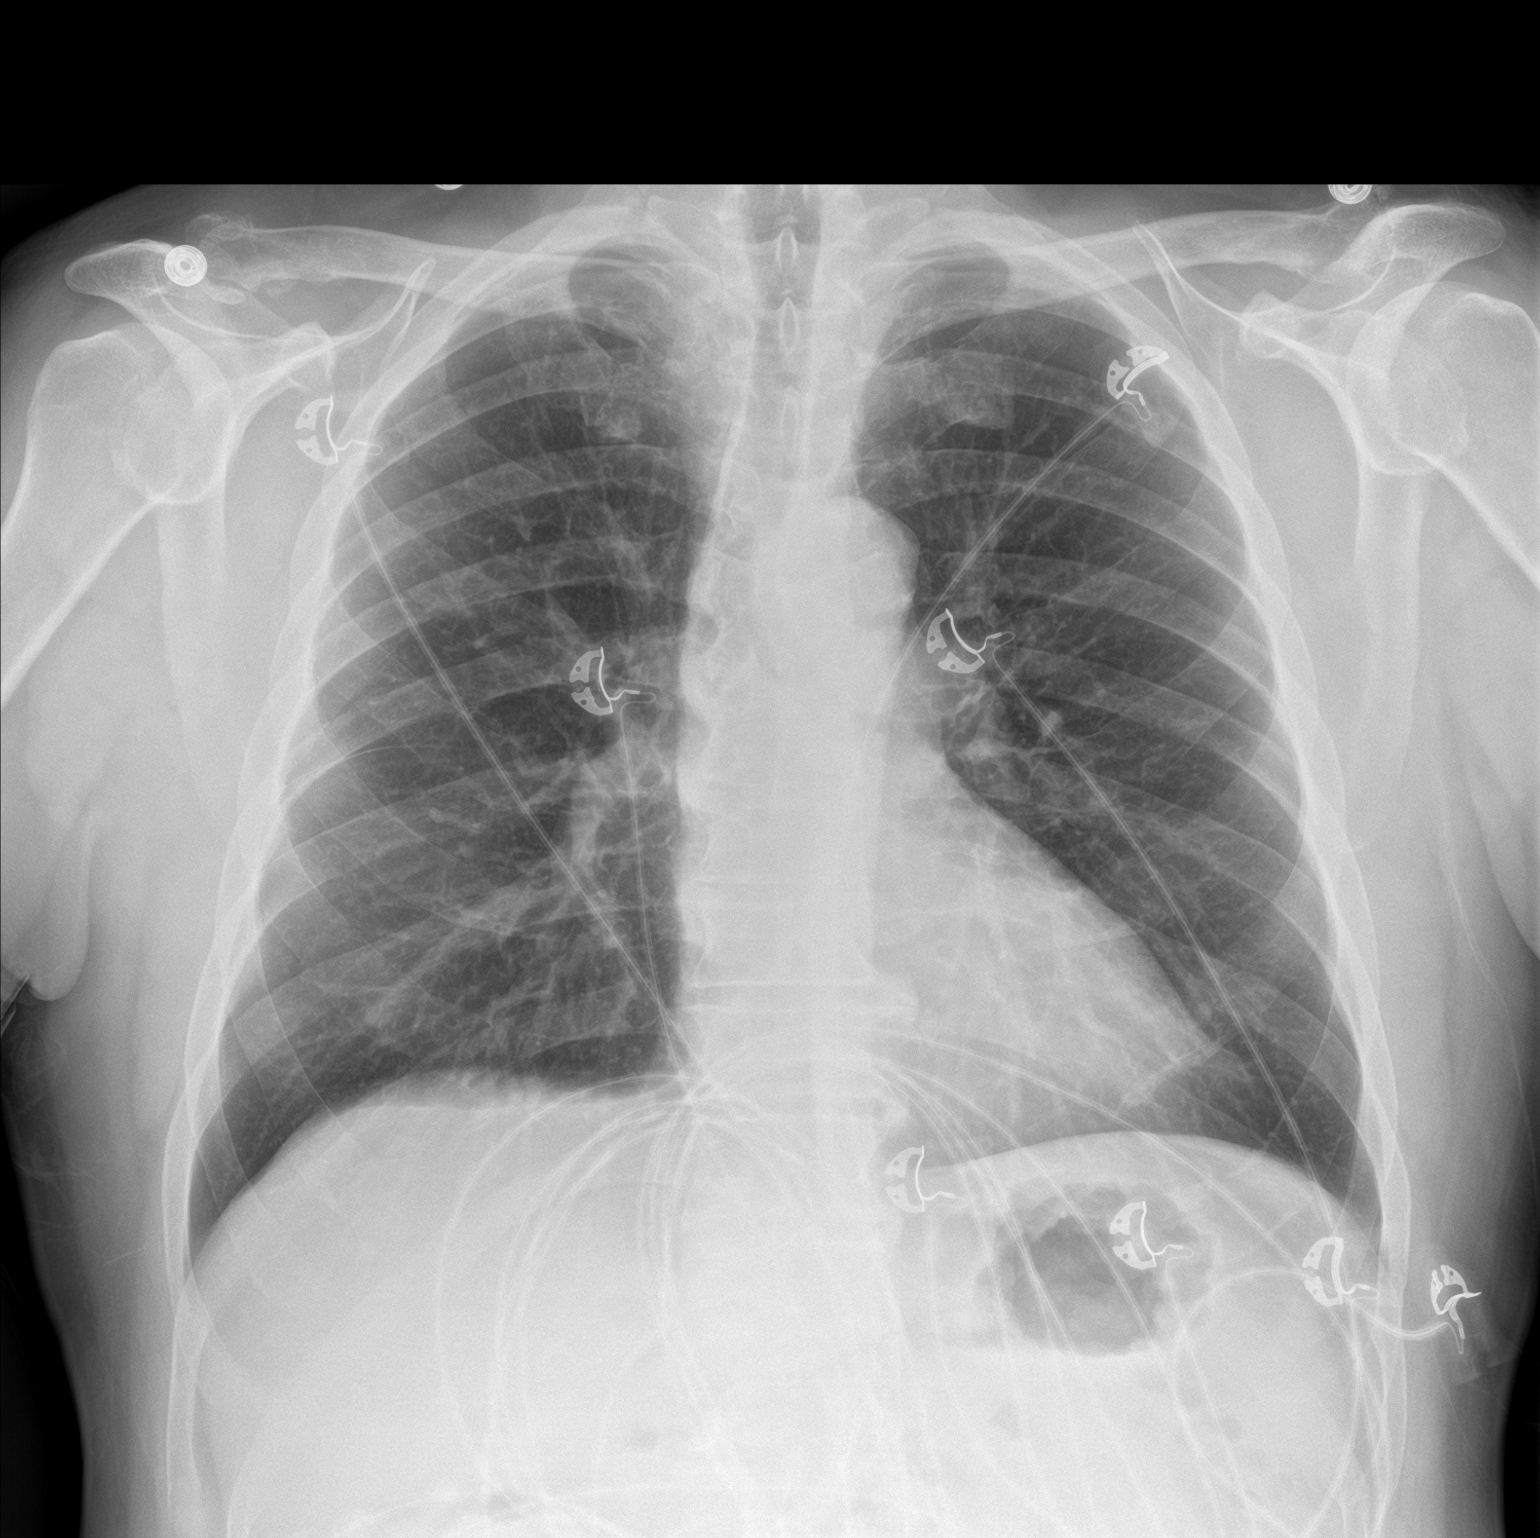

[chest lat]
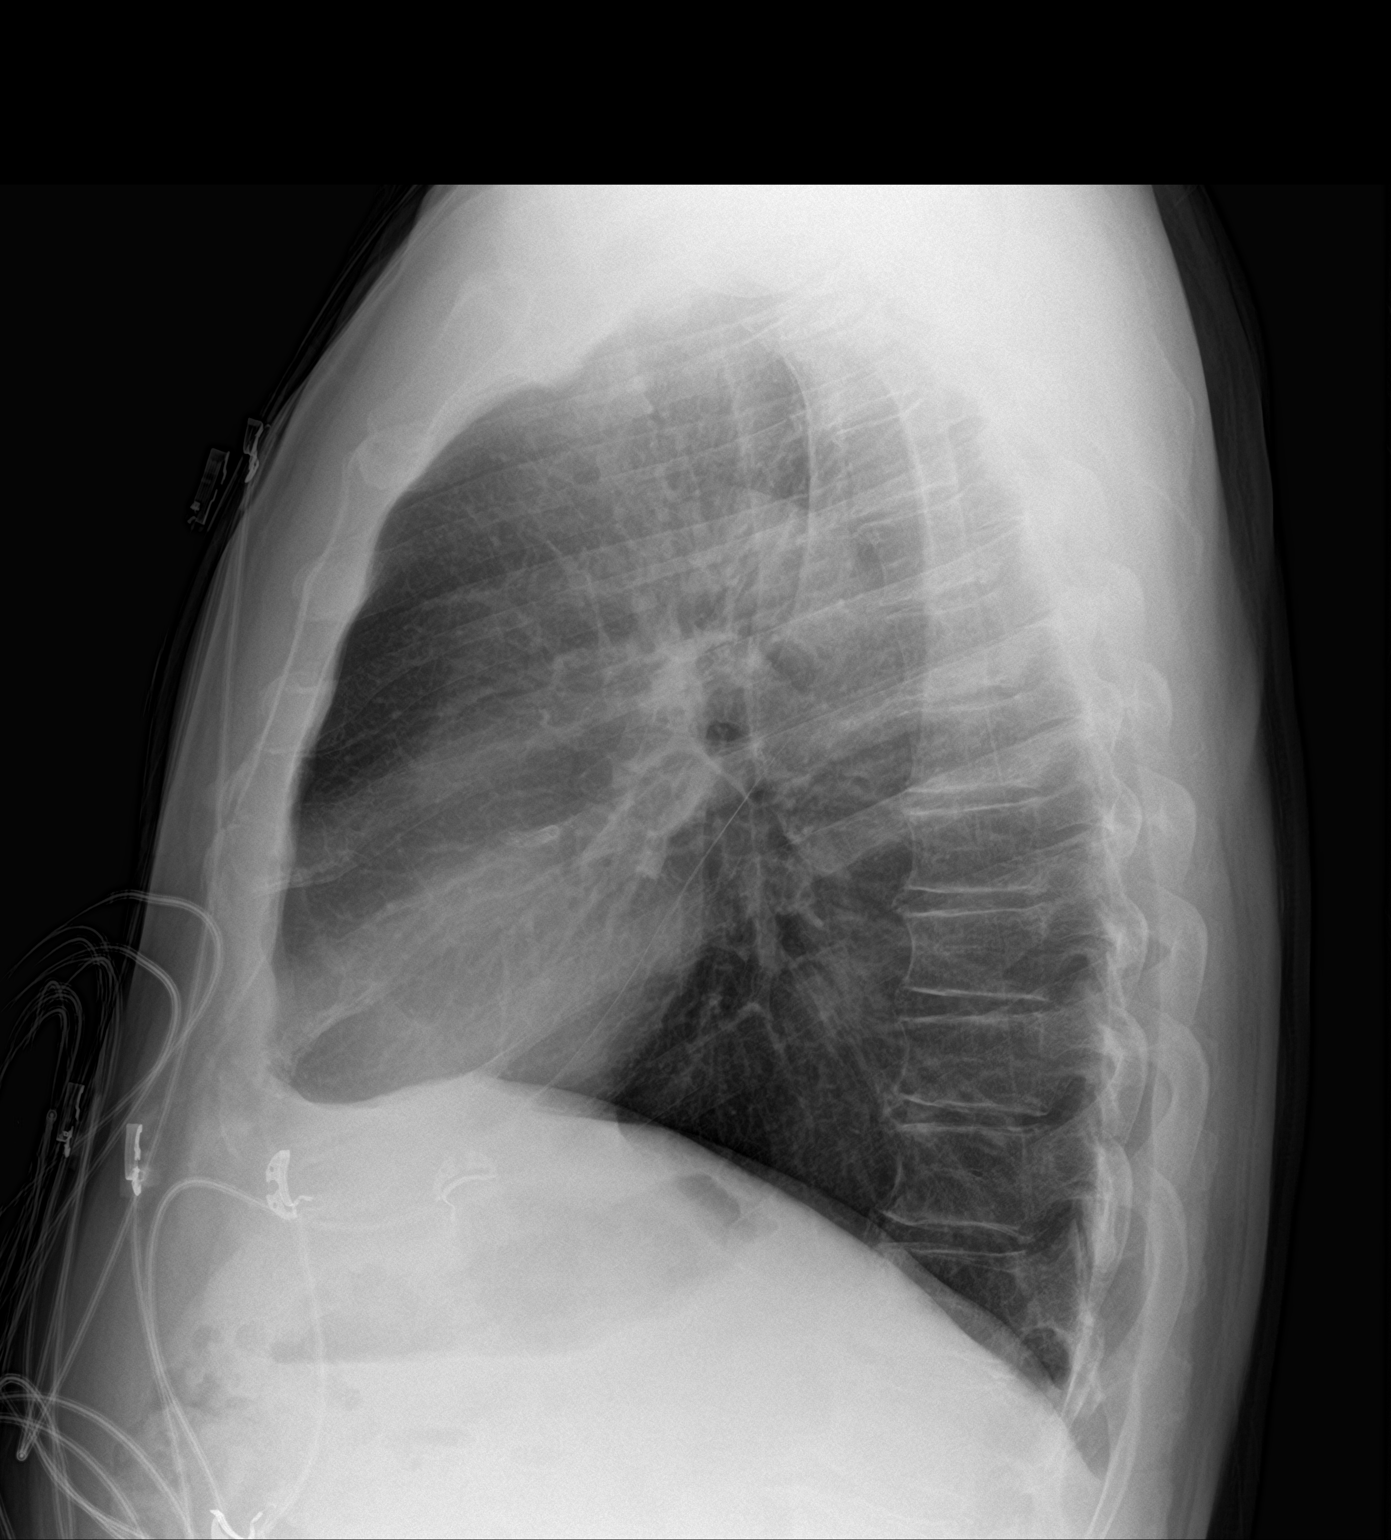

[2 of 2 positions shown; findings below may reference images not displayed]

FINDINGS: The heart size and mediastinal contours are within normal limits.
Coronary stent. Both lungs are clear. The visualized skeletal
structures are unremarkable.
IMPRESSION: No acute abnormalities.

## 2019-09-09 NOTE — Progress Notes (Signed)
Cardiology Office Note    Date:  09/12/2019   ID:  [name], DOB 1951/08/01, MRN [id number]  PCP:  [name], MD  Cardiologist:  Dr. [name]  Chief Complaint  Patient presents with  . Coronary Artery Disease    History of Present Illness:  [name] is a 68 y.o. male with PMH of CAD, HTN, HLD (intolerant of statins), sinus bradycardia, history of provoked DVT, PVCs on previous 48-hour monitor, mild carotid artery disease and prostate cancer.  Previous cardiac catheterization performed in the setting of an STEMI on 09/21/2017 showed patent LAD stent with 50% mid LAD lesion, 60% mid left circumflex lesion, and 60% PDA lesion.  PDA lesion is unchanged from 2012.  Left circumflex lesion was significant by FFR and he underwent PCI with DES.  Later that night he had recurrent chest pain and was taken back to the [facility].  Cardiac catheterization revealed patent left circumflex stent with dissection into the OM 3.  Medical therapy was planned as any reattempt with crossing with a wire could lead to worsening dissection.  Due to persistent symptoms, he underwent repeat cardiac cath on 01/11/2018, that showed widely patent stents, the wire dissection had healed completely, EF was normal at the time.  He underwent repeat cardiac cath on 08/03/2018 due to exertional chest discomfort.  This showed new restenosis in the left circumflex at the proximal stent margin, there was also progression of disease in the PDA.  Both side were treated with DES.  He was enrolled in [place] 4 trial.  Based on the last note in February 2020, he did not wish to continue in [place] 4 trial due to myalgia after injection.  He was last seen by me on 08/22/2018 at which time he was doing well.  He was seen in early September  recurrent exertional chest tightness and dyspnea since August 7th.  No resting chest pain. He underwent repeat cardiac cath on 06/05/19 showing a new stenosis in the LCx just distal to prior stent.  This was treated with repeat DES. Following cath he had persistent chests pain and was actually readmitted. Ecg was nonacute. Troponin was not c/w ACS. He did undergo repeat cath showing patent stents. There was moderate mid LAD disease that was chronic and unchanged from prior studies. He did have an abnormal Flow wire assessment so this was stented as well. DC home on 06/08/19. Amlodipine dose increased for BP.  We later increased losartan to 50 mg daily for BP. He was seen by Pharm D for lipid therapy. Multiple options reviewed. He wanted to rechallenge with low dose Crestor with possible addition of Zetia if not at goal.   On follow up today he is tolerating the low dose Crestor OK. He feels tired. No chest pain. Brings copy of his BP readings for past 3 months and really has good readings in last 2 weeks with above changes.     Past Medical History:  Diagnosis Date  . Abnormality of aortic valve    a. possibly bicuspid.  . Asthma    Childhood  . Carotid artery disease ([place])    a. 1-39% bilaterally in 2017.  [name] Coronary artery disease    a. anterior STEMI 04/2011, s/p DES to LAD.  [name] DVT (deep venous thrombosis) ([facility])    a. tx with Xarelto x 5 months, stopped due to hematuria.  . Frequent PVCs    a. seen on Holter monitor.  . Hyperlipidemia, mixed   .  Hypertension   . Myocardial infarction ([place]) 05/21/2011   anterior wall, treated by Dr. Marylyn [facility] with 3.0x55mm Promus drug eluting stent to proximal LAD   . Peripheral arterial disease ([facility])    moderate left ICA disease, mild right ICA disease  . Prostate cancer ([name]) 2007  . Sinus bradycardia    a. HR 50s at home.  . Statin intolerance   . Unstable angina ([place]) 08/03/2018    Past Surgical History:  Procedure Laterality Date  . CARDIAC CATHETERIZATION  05/23/2011, 05/21/2011   05/21/2011   3.0 x 21mm Promus Element drug eluting stent positioned in proximal LAD, stenosis taken from 99% to 0%.  . CORONARY STENT INTERVENTION N/A  09/21/2017   Procedure: CORONARY STENT INTERVENTION;  Surgeon: [name], MD;  Location: [name];  Service: Cardiovascular;  Laterality: N/A;  . CORONARY STENT INTERVENTION N/A 08/03/2018   Procedure: CORONARY STENT INTERVENTION;  Surgeon: [name], Malina Geers M, MD;  Location: [name];  Service: Cardiovascular;  Laterality: N/A;  . CORONARY STENT INTERVENTION N/A 06/05/2019   Procedure: CORONARY STENT INTERVENTION;  Surgeon: [name], Collie Wernick M, MD;  Location: [name];  Service: Cardiovascular;  Laterality: N/A;  . CORONARY STENT INTERVENTION N/A 06/07/2019   Procedure: CORONARY STENT INTERVENTION;  Surgeon: Wellington Hampshire, MD;  [facility]: [name];  Service: Cardiovascular;  Laterality: N/A;  . CORONARY/GRAFT ACUTE MI REVASCULARIZATION N/A 09/21/2017   Procedure: Coronary/Graft Acute MI Revascularization;  Surgeon: [name], Modesty Rudy M, MD;  [facility]: [name];  Service: Cardiovascular;  Laterality: N/A;  . HERNIA REPAIR    . INTRAVASCULAR PRESSURE WIRE/FFR STUDY N/A 09/21/2017   Procedure: INTRAVASCULAR PRESSURE WIRE/FFR STUDY;  Surgeon: [name], MD;  Location: [name];  Service: Cardiovascular;  Laterality: N/A;  . INTRAVASCULAR PRESSURE WIRE/FFR STUDY N/A 06/07/2019   Procedure: INTRAVASCULAR PRESSURE WIRE/FFR STUDY;  Surgeon: Wellington Hampshire, MD;  Location: [name];  Service: Cardiovascular;  Laterality: N/A;  . LEFT HEART CATH AND CORONARY ANGIOGRAPHY N/A 09/21/2017   Procedure: LEFT HEART CATH AND CORONARY ANGIOGRAPHY;  Surgeon: [name], Millenia Waldvogel M, MD;  Location: [name];  Service: Cardiovascular;  Laterality: N/A;  . LEFT HEART CATH AND CORONARY ANGIOGRAPHY N/A 09/21/2017   Procedure: LEFT HEART CATH AND CORONARY ANGIOGRAPHY;  Surgeon: [name], MD;  [facility]: [name];  Service: Cardiovascular;  Laterality: N/A;  . LEFT HEART CATH AND CORONARY ANGIOGRAPHY N/A 01/11/2018   Procedure:  LEFT HEART CATH AND CORONARY ANGIOGRAPHY;  Surgeon: [name], Shi Grose M, MD;  Location: [facility];  Service: Cardiovascular;  Laterality: N/A;  . LEFT HEART CATH AND CORONARY ANGIOGRAPHY N/A 08/03/2018   Procedure: LEFT HEART CATH AND CORONARY ANGIOGRAPHY;  Surgeon: [name], Zeniah Briney M, MD;  Location: [facility];  Service: Cardiovascular;  Laterality: N/A;  . LEFT HEART CATH AND CORONARY ANGIOGRAPHY N/A 06/05/2019   Procedure: LEFT HEART CATH AND CORONARY ANGIOGRAPHY;  Surgeon: [name], Linell Shawn M, MD;  Location: [facility];  Service: Cardiovascular;  Laterality: N/A;  . LEFT HEART CATH AND CORONARY ANGIOGRAPHY N/A 06/07/2019   Procedure: LEFT HEART CATH AND CORONARY ANGIOGRAPHY;  Surgeon: Wellington Hampshire, MD;  Location: [facility];  Service: Cardiovascular;  Laterality: N/A;  . PROSTATECTOMY  2007    Current Medications: Outpatient Medications Prior to Visit  Medication Sig Dispense Refill  . amLODipine (NORVASC) 10 MG tablet Take 0.5 tablets (5 mg total) by  mouth daily. 90 tablet 3  . aspirin 81 MG tablet Take 81 mg by mouth daily.     . clopidogrel (PLAVIX) 75 MG tablet Take 1 tablet (75 mg total) by mouth daily. 90 tablet 3  . Coenzyme Q10 (CO Q-10) 100 MG CAPS Take 100 mg by mouth daily.     [name] losartan (COZAAR) 50 MG tablet Take 1 tablet (50 mg total) by mouth daily. 90 tablet 3  . metoprolol tartrate (LOPRESSOR) 25 MG tablet Take 1 tablet (25 mg total) by mouth 2 (two) times daily AND 1 tablet (25 mg total) every evening. 135 tablet 3  . metoprolol tartrate (LOPRESSOR) 25 MG tablet Take 1 tablet (25 mg total) by mouth 2 (two) times daily. 180 tablet 3  . Multiple Vitamin (MULTIVITAMIN WITH MINERALS) TABS tablet Take 1 tablet by mouth daily.    . nitroGLYCERIN (NITROSTAT) 0.4 MG SL tablet Place 1 tablet (0.4 mg total) under the tongue every 5 (five) minutes as needed for chest pain. (Patient taking differently: Place 0.4 mg under the tongue every 5 (five) minutes x 3  doses as needed for chest pain. ) 25 tablet 3  . rosuvastatin (CRESTOR) 10 MG tablet Take 10mg  every Monday, Wednesdays, and Friday 60 tablet 0   No facility-administered medications prior to visit.     Allergies:   Adhesive [tape], Demerol [meperidine], Fentanyl, and Statins   Social History   Socioeconomic History  . Marital status: Married    Spouse name: Not on file  . Number of children: 0  . Years of education: Not on file  . Highest education level: Not on file  Occupational History  . Not on file  Tobacco Use  . Smoking status: Former Smoker    Packs/day: 1.00    Years: 7.00    Pack years: 7.00    Types: Cigarettes    Start date: 03/05/1970    Quit date: 09/26/1976    Years since quitting: 42.9  . Smokeless tobacco: Never Used  Substance and Sexual Activity  . Alcohol use: No    Alcohol/week: 0.0 standard drinks  . Drug use: No  . Sexual activity: Never  Other Topics Concern  . Not on file  Social History Narrative  . Not on file   Social Determinants of Health   Financial Resource Strain:   . Difficulty of Paying Living Expenses: Not on file  Food Insecurity:   . Worried About Charity fundraiser in the Last Year: Not on file  . Ran Out of Food in the Last Year: Not on file  Transportation Needs:   . Lack of Transportation (Medical): Not on file  . Lack of Transportation (Non-Medical): Not on file  Physical Activity:   . Days of Exercise per Week: Not on file  . Minutes of Exercise per Session: Not on file  Stress:   . Feeling of Stress : Not on file  Social Connections:   . Frequency of Communication with Friends and Family: Not on file  . Frequency of Social Gatherings with Friends and Family: Not on file  . Attends Religious Services: Not on file  . Active Member of Clubs or Organizations: Not on file  . Attends Archivist Meetings: Not on file  . Marital Status: Not on file     Family History:  The patient's family history includes  Heart failure in his father; Leukemia in his mother.   ROS:   Please see the history of present illness.  ROS All other systems reviewed and are negative.   PHYSICAL EXAM:   VS:  BP 131/81   Pulse (!) 55   Ht 5\' 7"  (1.702 m)   Wt 181 lb (82.1 kg)   SpO2 97%   BMI 28.35 kg/m    GEN: Well nourished, well developed, in no acute distress  HEENT: normal  Neck: no JVD, carotid bruits, or masses Cardiac: RRR; no murmurs, rubs, or gallops,no edema  Respiratory:  clear to auscultation bilaterally, normal work of breathing GI: soft, nontender, nondistended, + BS MS: no deformity or atrophy Skin: warm and dry, no rash Neuro:  Alert and Oriented x 3, Strength and sensation are intact Psych: euthymic mood, full affect  Wt Readings from Last 3 Encounters:  09/12/19 181 lb (82.1 kg)  06/27/19 183 lb (83 kg)  06/11/19 183 lb 9.6 oz (83.3 kg)      Studies/Labs Reviewed:   EKG:  EKG is ordered today.  The ekg ordered today demonstrates sinus bradycardia without significant ST-T wave changes.  Recent Labs: 06/07/2019: ALT 20 06/08/2019: BUN 11; Creatinine, Ser 0.86; Hemoglobin 15.4; Platelets 221; Potassium 3.9; Sodium 138   Lipid Panel    Component Value Date/Time   CHOL 204 (H) 05/30/2019 0949   CHOL 152 03/22/2013 0813   TRIG 105 05/30/2019 0949   TRIG 83 03/22/2013 0813   HDL 42 05/30/2019 0949   HDL 39 (L) 03/22/2013 0813   CHOLHDL 4.9 05/30/2019 0949   CHOLHDL 5.5 12/28/2017 1027   VLDL 43 (H) 12/28/2017 1027   LDLCALC 143 (H) 05/30/2019 0949   LDLCALC 96 03/22/2013 0813    Additional studies/ records that were reviewed today include:   Cath 08/03/2018  Prox LAD lesion is 45% stenosed.  Ost RPDA to RPDA lesion is 80% stenosed.  A drug-eluting stent was successfully placed using a STENT SYNERGY DES 2.25X12.  Post intervention, there is a 0% residual stenosis.  Ost LAD to Prox LAD lesion is 35% stenosed.  Prox Cx-1 lesion is 95% stenosed.  Post intervention,  there is a 0% residual stenosis.  A drug-eluting stent was successfully placed using a STENT SYNERGY DES 3X12.  Non-stenotic Prox Cx-2 lesion was previously treated.  Post Atrio lesion is 30% stenosed.  LV end diastolic pressure is normal.   1. 2 vessel obstructive CAD    - patent stent in the proximal LAD. Diffuse 45-50% mid LAD    - 95% stenosis in the LCx at the proximal stent margin    - 80% PDA 2. Normal LVEDP 3. Successful PCI of the LCx with DES x 1 overlapping the prior stent 4. Successful PCI of the PDA with DES x 1.   Plan: Anticipate same day discharge.   Recommend uninterrupted dual antiplatelet therapy with Aspirin 81mg  daily and Clopidogrel 75 mg daily for a minimum of 12 months (ACS - Class I recommendation).  Cath 06/05/19:  CORONARY STENT INTERVENTION  LEFT HEART CATH AND CORONARY ANGIOGRAPHY  Conclusion    Prox LAD lesion is 45% stenosed.  Ost LAD to Prox LAD lesion is 40% stenosed.  Non-stenotic Prox Cx-2 lesion was previously treated.  Previously placed Prox Cx-1 drug eluting stent is widely patent.  Prox RCA lesion is 25% stenosed.  Previously placed Ost RPDA to RPDA drug eluting stent is widely patent.  RPAV lesion is 30% stenosed.  Mid Cx lesion is 90% stenosed.  A drug-eluting stent was successfully placed using a STENT RESOLUTE ONYX 2.5X15.  Post intervention, there is a  0% residual stenosis.  The left ventricular systolic function is normal.  LV end diastolic pressure is normal.  The left ventricular ejection fraction is 55-65% by visual estimate.   1. Single vessel obstructive CAD. 90% stenosis in the mid LCx distal to prior stents into the OM1 2. Continued stent patency in the LAD, PDA and proximal to mid LCx 3. Normal LV function 4. Normal LVEDP 5. Successful PCI of the mid LCx/OM1 with DES x 1 in overlapping fashion  Plan: DAPT indefinitely. He is a candidate for same day discharge. Needs referral to lipid clinic to address  his hypercholesterolemia and intolerance to statins. With aggressive CAD target LDL <50.     Cardiac cath 06/07/19:  CORONARY STENT INTERVENTION  INTRAVASCULAR PRESSURE WIRE/FFR STUDY  LEFT HEART CATH AND CORONARY ANGIOGRAPHY  Conclusion    Ost LAD to Prox LAD lesion is 40% stenosed.  Non-stenotic Prox Cx-1 lesion was previously treated.  Previously placed Prox Cx-2 drug eluting stent is widely patent.  Balloon angioplasty was performed.  Previously placed Mid Cx drug eluting stent is widely patent.  Balloon angioplasty was performed.  Non-stenotic Ost RPDA to RPDA lesion was previously treated.  RPAV lesion is 30% stenosed.  Prox RCA lesion is 30% stenosed.  Prox LAD to Mid LAD lesion is 70% stenosed.  Post intervention, there is a 0% residual stenosis.  A drug-eluting stent was successfully placed using a STENT RESOLUTE ONYX 3.0X30.  The left ventricular systolic function is normal.  LV end diastolic pressure is normal.  The left ventricular ejection fraction is 55-65% by visual estimate.   1.  Widely patent left circumflex and RCA stents.  There is a stent in the proximal LAD which is patent with moderate in-stent restenosis in the proximal segment which was not significant by DFR (0.94).  In addition, the LAD has 70% stenosis in the midsegment distal to the previously placed stent.  This was significant by DFR (0.85) and thus this was treated with PCI and drug-eluting stent placement.  The stent overlapped with the previously placed proximal stent. 2.  Normal LV systolic function and left ventricular end-diastolic pressure.  Recommendations: Dual antiplatelet therapy for at least 6 months and preferably long-term given multiple overlapped stents. Recommend referral to the lipid clinic for treatment of hyperlipidemia.      ASSESSMENT:    1. CAD S/P percutaneous coronary angioplasty   2. Mixed hyperlipidemia   3. Essential hypertension      PLAN:  In  order of problems listed above:  1. CAD s/p multiple prior stent procedures. S/p stent of LCx for restenosis in September. He had stenting of the mid LAD for abnormal FFR 2 weeks later for continued chest pain. Denies any current chest pain. Continue aspirin and Plavix indefinitely.    2. Hypertension: Blood pressure now well controlled on current therapy. Now on amlodipine 5 mg daily. Losartan 50 mg daily, and metoprolol 25 mg in the am and 50 mg in the pm.   3. Hyperlipidemia: With multiple prior stent procedures and aggressive CAD he needs aggressive lipid lowering therapy. Seen in lipid clinic. No on low dose Crestor 3x/week. Will recheck labs today.     Medication Adjustments/Labs and Tests Ordered: Current medicines are reviewed at length with the patient today.  Concerns regarding medicines are outlined above.  Medication changes, Labs and Tests ordered today are listed in the Patient Instructions below. There are no Patient Instructions on file for this visit.   Signed, Koven Belinsky [name],  MD  09/12/2019 11:35 AM    [facility], [place], [place]  [address] Phone: [phone]; Fax: [phone]

## 2019-09-12 ENCOUNTER — Encounter: Payer: Self-pay | Admitting: Cardiology

## 2019-09-12 ENCOUNTER — Ambulatory Visit (INDEPENDENT_AMBULATORY_CARE_PROVIDER_SITE_OTHER): Payer: PPO | Admitting: Cardiology

## 2019-09-12 ENCOUNTER — Other Ambulatory Visit: Payer: Self-pay

## 2019-09-12 VITALS — BP 131/81 | HR 55 | Ht 67.0 in | Wt 181.0 lb

## 2019-09-12 DIAGNOSIS — E782 Mixed hyperlipidemia: Secondary | ICD-10-CM

## 2019-09-12 DIAGNOSIS — I10 Essential (primary) hypertension: Secondary | ICD-10-CM | POA: Diagnosis not present

## 2019-09-12 DIAGNOSIS — Z9861 Coronary angioplasty status: Secondary | ICD-10-CM

## 2019-09-12 DIAGNOSIS — I2510 Atherosclerotic heart disease of native coronary artery without angina pectoris: Secondary | ICD-10-CM | POA: Diagnosis not present

## 2019-09-12 LAB — BASIC METABOLIC PANEL
BUN/Creatinine Ratio: 15 (ref 10–24)
BUN: 13 mg/dL (ref 8–27)
CO2: 24 mmol/L (ref 20–29)
Calcium: 9.4 mg/dL (ref 8.6–10.2)
Chloride: 99 mmol/L (ref 96–106)
Creatinine, Ser: 0.89 mg/dL (ref 0.76–1.27)
GFR calc Af Amer: 101 mL/min/{1.73_m2} (ref >59)
GFR calc non Af Amer: 87 mL/min/{1.73_m2} (ref >59)
Glucose: 94 mg/dL (ref 65–99)
Potassium: 5 mmol/L (ref 3.5–5.2)
Sodium: 141 mmol/L (ref 134–144)

## 2019-09-12 LAB — LIPID PANEL
Chol/HDL Ratio: 3.7 ratio (ref 0.0–5.0)
Cholesterol, Total: 154 mg/dL (ref 100–199)
HDL: 42 mg/dL (ref >39)
LDL Chol Calc (NIH): 94 mg/dL (ref 0–99)
Triglycerides: 96 mg/dL (ref 0–149)
VLDL Cholesterol Cal: 18 mg/dL (ref 5–40)

## 2019-09-12 LAB — HEPATIC FUNCTION PANEL
ALT: 18 IU/L (ref 0–44)
AST: 22 IU/L (ref 0–40)
Albumin: 4.7 g/dL (ref 3.8–4.8)
Alkaline Phosphatase: 93 IU/L (ref 39–117)
Bilirubin Total: 0.5 mg/dL (ref 0.0–1.2)
Bilirubin, Direct: 0.16 mg/dL (ref 0.00–0.40)
Total Protein: 6.6 g/dL (ref 6.0–8.5)

## 2019-09-13 ENCOUNTER — Other Ambulatory Visit: Payer: Self-pay

## 2019-10-18 ENCOUNTER — Other Ambulatory Visit: Payer: PPO

## 2019-11-30 IMAGING — DX DG CHEST 2V
2 series · 2 of 2 positions shown · non-contrast
Comparison: 09/20/2017.

CLINICAL DATA: Chest pressure.  Shortness of breath.

EXAM:
CHEST - 2 VIEW

[chest pa]
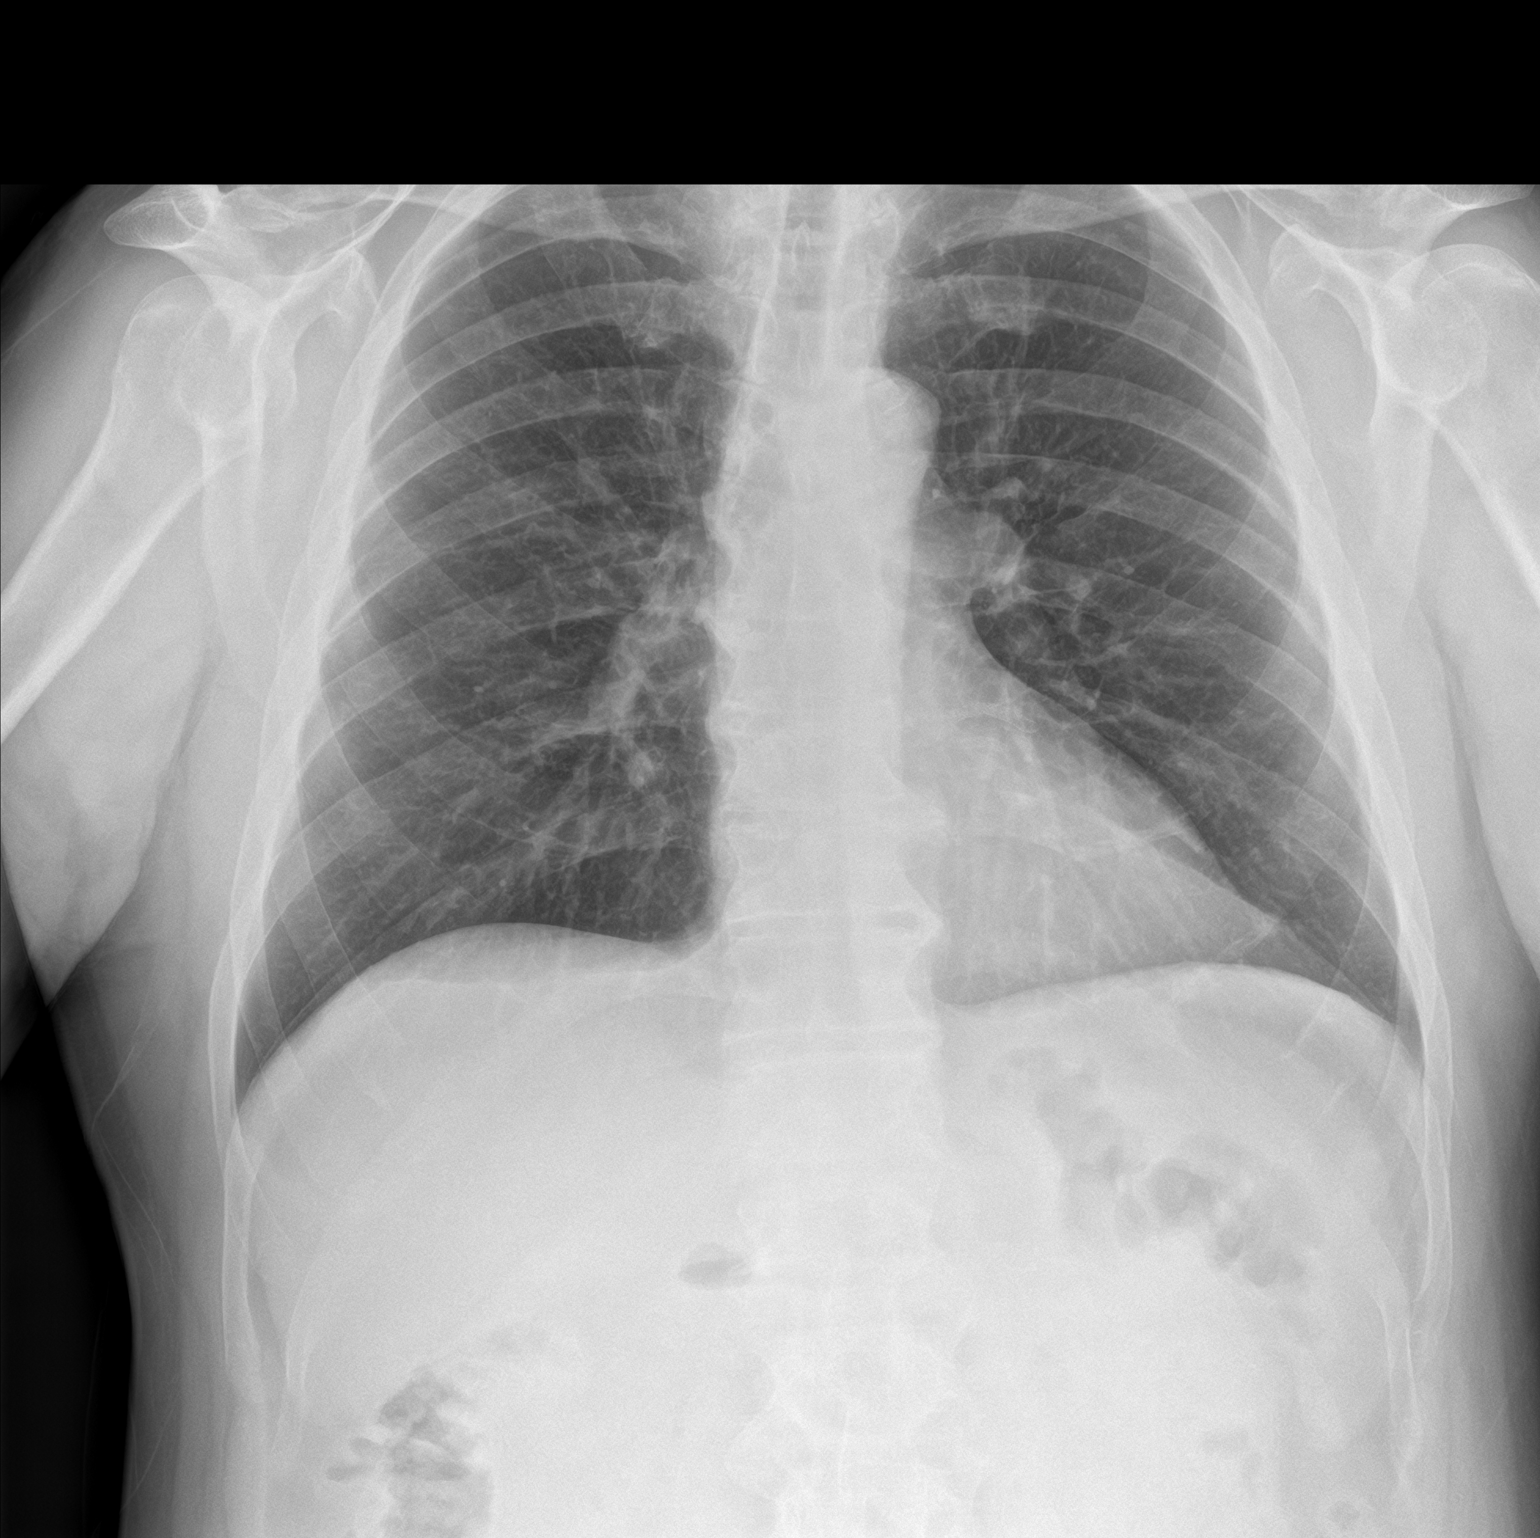

[chest lat]
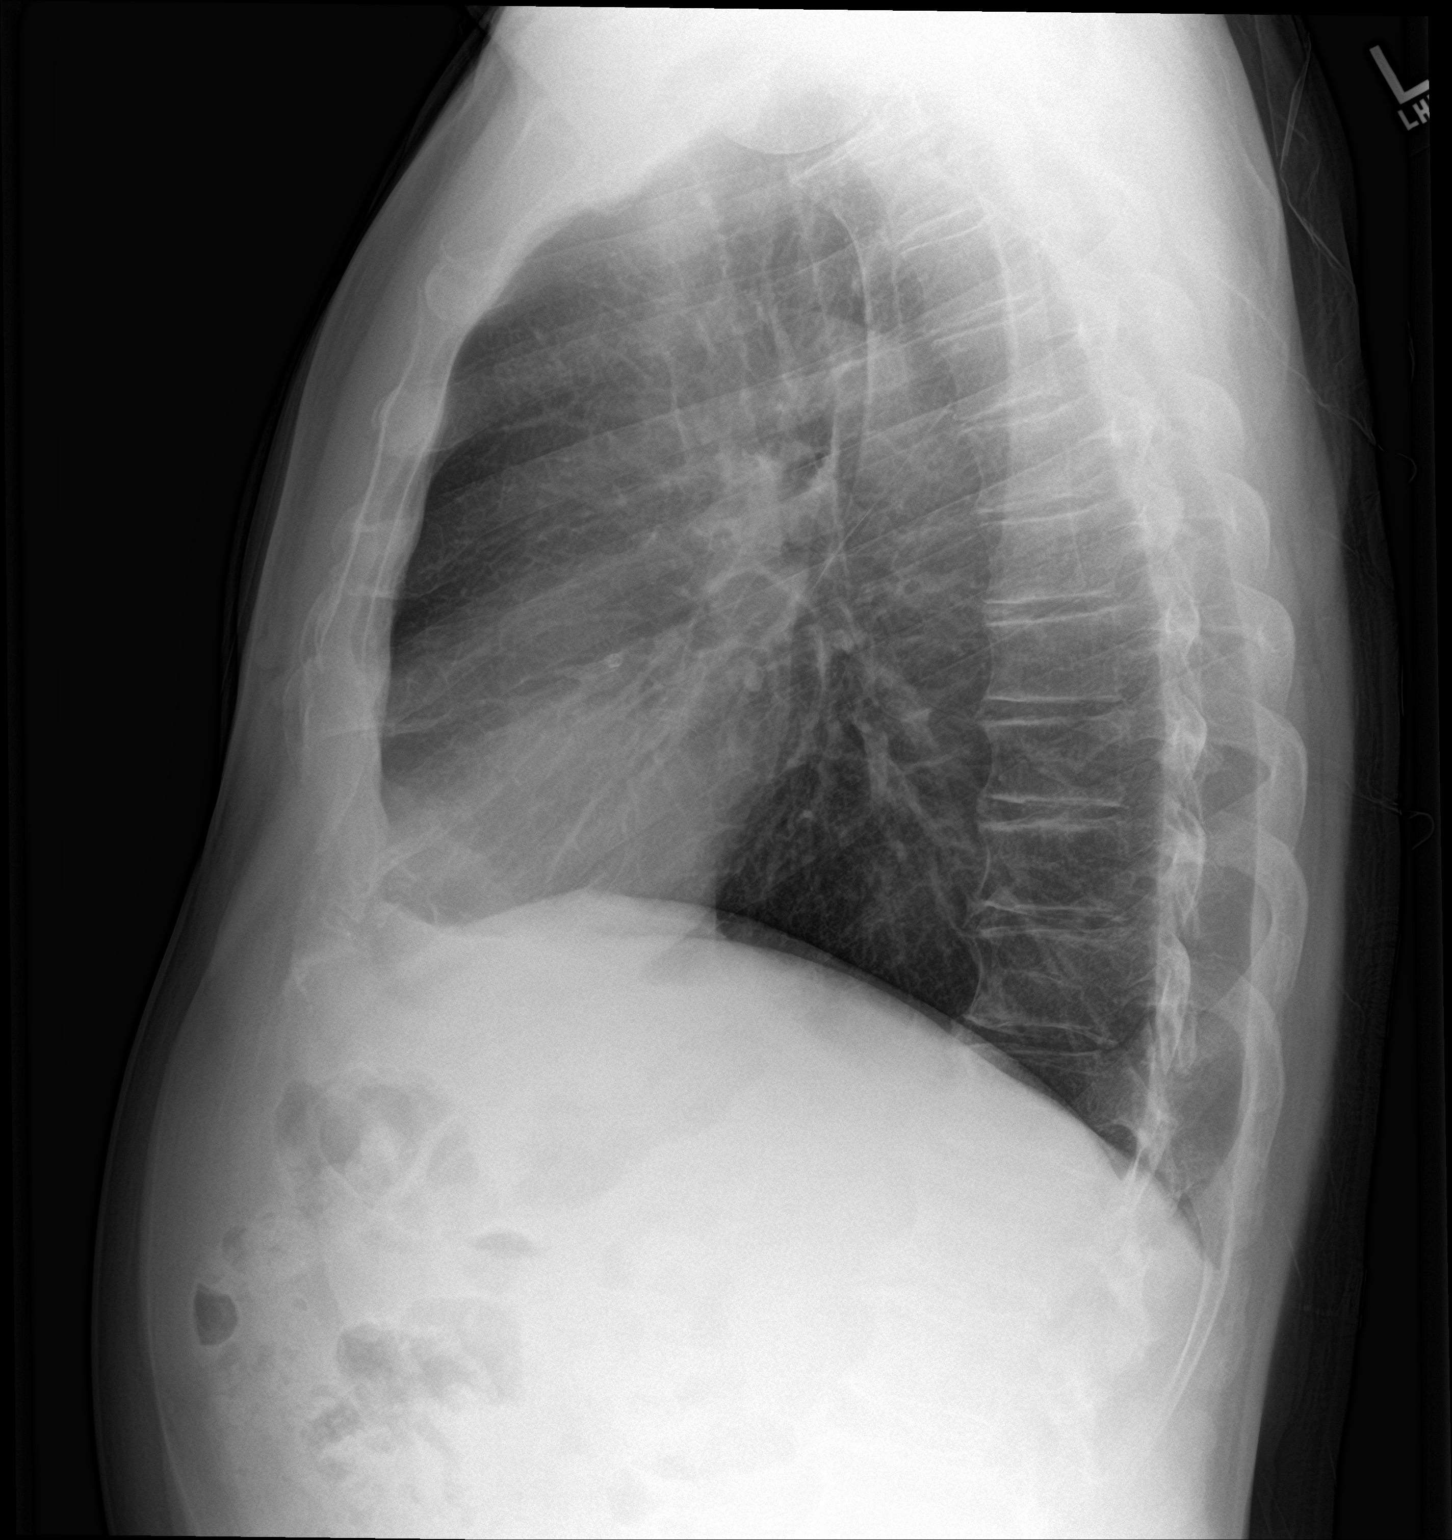

[2 of 2 positions shown; findings below may reference images not displayed]

FINDINGS: Mediastinum hilar structures normal. Lungs are clear. Heart size
normal. No pleural effusion or pneumothorax. Degenerative change
thoracic spine. No acute bony abnormality.
IMPRESSION: No acute cardiopulmonary disease.

## 2019-12-07 ENCOUNTER — Other Ambulatory Visit: Payer: Self-pay | Admitting: Cardiology

## 2019-12-31 MED ORDER — METOPROLOL TARTRATE 25 MG PO TABS: 25.0000 mg | ORAL_TABLET | Freq: Two times a day (BID) | ORAL | 3 refills | Status: DC

## 2020-01-25 ENCOUNTER — Other Ambulatory Visit: Payer: Self-pay | Admitting: Cardiology

## 2020-04-09 NOTE — Progress Notes (Signed)
Cardiology Office Note    Date:  04/13/2020   ID:  [name], DOB [birth year], MRN [id number]  PCP:  [name], MD  Cardiologist:  Dr. [name]  Chief Complaint  Patient presents with   Coronary Artery Disease    History of Present Illness:  BURLIE CAJAMARCA is a 69 y.o. male with PMH of CAD, HTN, HLD (intolerant of statins), sinus bradycardia, history of provoked DVT, PVCs on previous 48-hour monitor, mild carotid artery disease and prostate cancer.  Previous cardiac catheterization performed in the setting of an STEMI on 09/21/2017 showed patent LAD stent with 50% mid LAD lesion, 60% mid left circumflex lesion, and 60% PDA lesion.  PDA lesion is unchanged from 2012.  Left circumflex lesion was significant by FFR and he underwent PCI with DES.  Later that night he had recurrent chest pain and was taken back to the Cath Lab.  Cardiac catheterization revealed patent left circumflex stent with dissection into the OM 3.  Medical therapy was planned as any reattempt with crossing with a wire could lead to worsening dissection.  Due to persistent symptoms, he underwent repeat cardiac cath on 01/11/2018, that showed widely patent stents, the wire dissection had healed completely, EF was normal at the time.  He underwent repeat cardiac cath on 08/03/2018 due to exertional chest discomfort.  This showed new restenosis in the left circumflex at the proximal stent margin, there was also progression of disease in the PDA.  Both side were treated with DES.  He was enrolled in [place] 4 trial.  Based on the last note in February 2020, he did not wish to continue in [place] 4 trial due to myalgia after injection.  He was last seen by me on 08/22/2018 at which time he was doing well.  He was seen in early September  recurrent exertional chest tightness and dyspnea since August 7th.  No resting chest pain. He underwent repeat cardiac cath on 06/05/19 showing a new stenosis in the LCx just distal to prior stent.  This was treated with repeat DES. Following cath he had persistent chests pain and was actually readmitted. Ecg was nonacute. Troponin was not c/w ACS. He did undergo repeat cath showing patent stents. There was moderate mid LAD disease that was chronic and unchanged from prior studies. He did have an abnormal Flow wire assessment so this was stented as well. DC home on 06/08/19. Amlodipine dose increased for BP.  We later increased losartan to 50 mg daily for BP. He was seen by Pharm D for lipid therapy. Multiple options reviewed. He wanted to rechallenge with low dose Crestor with possible addition of Zetia if not at goal.   On follow up today he is doing  OK. We tried to increase his Crestor to 4 days a week but he developed severe leg pain. He stopped it and it took a month to resolve. Now back on 3 days a week. No chest pain. BP at home typically 135/70.     Past Medical History:  Diagnosis Date   Abnormality of aortic valve    a. possibly bicuspid.   Asthma    Childhood   Carotid artery disease ([place])    a. 1-39% bilaterally in 2017.   Coronary artery disease    a. anterior STEMI 04/2011, s/p DES to LAD.   DVT (deep venous thrombosis) (HCC)    a. tx with Xarelto x 5 months, stopped due to hematuria.   Frequent PVCs  a. seen on Holter monitor.   Hyperlipidemia, mixed    Hypertension    Myocardial infarction ([place]) 05/21/2011   anterior wall, treated by Dr. [name] with 3.0x59mm Promus drug eluting stent to proximal LAD    Peripheral arterial disease ([place])    moderate left ICA disease, mild right ICA disease   Prostate cancer ([place]) 2007   Sinus bradycardia    a. HR 50s at home.   Statin intolerance    Unstable angina ([place]) 08/03/2018    Past Surgical History:  Procedure Laterality Date   CARDIAC CATHETERIZATION  05/23/2011, 05/21/2011   05/21/2011   3.0 x 28mm Promus Element drug eluting stent positioned in proximal LAD, stenosis taken from 99% to 0%.    CORONARY STENT INTERVENTION N/A 09/21/2017   Procedure: CORONARY STENT INTERVENTION;  Surgeon: [name], MD;  Location: [facility];  Service: Cardiovascular;  Laterality: N/A;   CORONARY STENT INTERVENTION N/A 08/03/2018   Procedure: CORONARY STENT INTERVENTION;  Surgeon: [name], Brittannie Tawney M, MD;  Location: [facility];  Service: Cardiovascular;  Laterality: N/A;   CORONARY STENT INTERVENTION N/A 06/05/2019   Procedure: CORONARY STENT INTERVENTION;  Surgeon: [name], Mackenzee Becvar M, MD;  Location: [facility];  Service: Cardiovascular;  Laterality: N/A;   CORONARY STENT INTERVENTION N/A 06/07/2019   Procedure: CORONARY STENT INTERVENTION;  Surgeon: Wellington Hampshire, MD;  Location: [facility];  Service: Cardiovascular;  Laterality: N/A;   CORONARY/GRAFT ACUTE MI REVASCULARIZATION N/A 09/21/2017   Procedure: Coronary/Graft Acute MI Revascularization;  Surgeon: [name], Katlen Seyer M, MD;  Location: [facility];  Service: Cardiovascular;  Laterality: N/A;   HERNIA REPAIR     INTRAVASCULAR PRESSURE WIRE/FFR STUDY N/A 09/21/2017   Procedure: INTRAVASCULAR PRESSURE WIRE/FFR STUDY;  Surgeon: [name], MD;  Location: [facility];  Service: Cardiovascular;  Laterality: N/A;   INTRAVASCULAR PRESSURE WIRE/FFR STUDY N/A 06/07/2019   Procedure: INTRAVASCULAR PRESSURE WIRE/FFR STUDY;  Surgeon: Wellington Hampshire, MD;  Location: [facility];  Service: Cardiovascular;  Laterality: N/A;   LEFT HEART CATH AND CORONARY ANGIOGRAPHY N/A 09/21/2017   Procedure: LEFT HEART CATH AND CORONARY ANGIOGRAPHY;  Surgeon: [name], Jaylie Neaves M, MD;  Location: [facility];  Service: Cardiovascular;  Laterality: N/A;   LEFT HEART CATH AND CORONARY ANGIOGRAPHY N/A 09/21/2017   Procedure: LEFT HEART CATH AND CORONARY ANGIOGRAPHY;  Surgeon: [name], MD;  Location: [facility];  Service: Cardiovascular;  Laterality: N/A;   LEFT HEART CATH AND CORONARY ANGIOGRAPHY  N/A 01/11/2018   Procedure: LEFT HEART CATH AND CORONARY ANGIOGRAPHY;  Surgeon: [name], Liyah Higham M, MD;  Location: [facility];  Service: Cardiovascular;  Laterality: N/A;   LEFT HEART CATH AND CORONARY ANGIOGRAPHY N/A 08/03/2018   Procedure: LEFT HEART CATH AND CORONARY ANGIOGRAPHY;  Surgeon: [name], Kellie Murrill M, MD;  Location: [facility];  Service: Cardiovascular;  Laterality: N/A;   LEFT HEART CATH AND CORONARY ANGIOGRAPHY N/A 06/05/2019   Procedure: LEFT HEART CATH AND CORONARY ANGIOGRAPHY;  Surgeon: [name], Kathlen Sakurai M, MD;  Location: [facility];  Service: Cardiovascular;  Laterality: N/A;   LEFT HEART CATH AND CORONARY ANGIOGRAPHY N/A 06/07/2019   Procedure: LEFT HEART CATH AND CORONARY ANGIOGRAPHY;  Surgeon: Wellington Hampshire, MD;  Location: [facility];  Service: Cardiovascular;  Laterality: N/A;   PROSTATECTOMY  2007    Current Medications: Outpatient Medications Prior to Visit  Medication Sig Dispense Refill  amLODipine (NORVASC) 10 MG tablet Take 0.5 tablets (5 mg total) by mouth daily. 90 tablet 3   aspirin 81 MG tablet Take 81 mg by mouth daily.      clopidogrel (PLAVIX) 75 MG tablet Take 1 tablet (75 mg total) by mouth daily. 90 tablet 3   Coenzyme Q10 (CO Q-10) 100 MG CAPS Take 100 mg by mouth daily.      losartan (COZAAR) 50 MG tablet Take 1 tablet (50 mg total) by mouth daily. 90 tablet 3   metoprolol tartrate (LOPRESSOR) 25 MG tablet Take 1 tablet (25 mg total) by mouth 2 (two) times daily AND 1 tablet (25 mg total) every evening. 135 tablet 3   Multiple Vitamin (MULTIVITAMIN WITH MINERALS) TABS tablet Take 1 tablet by mouth daily.     nitroGLYCERIN (NITROSTAT) 0.4 MG SL tablet Place 1 tablet (0.4 mg total) under the tongue every 5 (five) minutes as needed for chest pain. (Patient taking differently: Place 0.4 mg under the tongue every 5 (five) minutes x 3 doses as needed for chest pain. ) 25 tablet 3   rosuvastatin (CRESTOR) 10 MG tablet TAKE (1)  TABLET BY MOUTH DAILY ON MONDAY, WEDNESDAY AND FRIDAY. 36 tablet 3   metoprolol tartrate (LOPRESSOR) 25 MG tablet Take 1 tablet (25 mg total) by mouth 2 (two) times daily. 180 tablet 3   No facility-administered medications prior to visit.     Allergies:   Adhesive [tape], Demerol [meperidine], Fentanyl, and Statins   Social History   Socioeconomic History   Marital status: Married    Spouse name: Not on file   Number of children: 0   Years of education: Not on file   Highest education level: Not on file  Occupational History   Not on file  Tobacco Use   Smoking status: Former Smoker    Packs/day: 1.00    Years: 7.00    Pack years: 7.00    Types: Cigarettes    Start date: 03/05/1970    Quit date: 09/26/1976    Years since quitting: 43.5   Smokeless tobacco: Never Used  Vaping Use   Vaping Use: Never used  Substance and Sexual Activity   Alcohol use: No    Alcohol/week: 0.0 standard drinks   Drug use: No   Sexual activity: Never  Other Topics Concern   Not on file  Social History Narrative   Not on file   Social Determinants of Health   Financial Resource Strain:    Difficulty of Paying Living Expenses:   Food Insecurity:    Worried About Charity fundraiser in the Last Year:    Arboriculturist in the Last Year:   Transportation Needs:    Film/video editor (Medical):    Lack of Transportation (Non-Medical):   Physical Activity:    Days of Exercise per Week:    Minutes of Exercise per Session:   Stress:    Feeling of Stress :   Social Connections:    Frequency of Communication with Friends and Family:    Frequency of Social Gatherings with Friends and Family:    Attends Religious Services:    Active Member of Clubs or Organizations:    Attends Archivist Meetings:    Marital Status:      Family History:  The patient's family history includes Heart failure in his father; Leukemia in his mother.   ROS:   Please see  the history of present illness.  ROS All other systems reviewed and are negative.   PHYSICAL EXAM:   VS:  BP 134/72    Pulse 70    Temp 97.7 F (36.5 C)    Ht 5\' 7"  (1.702 m)    Wt 179 lb (81.2 kg)    BMI 28.04 kg/m    GEN: Well nourished, well developed, in no acute distress  HEENT: normal  Neck: no JVD, carotid bruits, or masses Cardiac: RRR; no murmurs, rubs, or gallops,no edema  Respiratory:  clear to auscultation bilaterally, normal work of breathing GI: soft, nontender, nondistended, + BS MS: no deformity or atrophy Skin: warm and dry, no rash Neuro:  Alert and Oriented x 3, Strength and sensation are intact Psych: euthymic mood, full affect  Wt Readings from Last 3 Encounters:  04/13/20 179 lb (81.2 kg)  09/12/19 181 lb (82.1 kg)  06/27/19 183 lb (83 kg)      Studies/Labs Reviewed:   EKG:  EKG is not ordered today.    Recent Labs: 06/08/2019: Hemoglobin 15.4; Platelets 221 09/12/2019: ALT 18; BUN 13; Creatinine, Ser 0.89; Potassium 5.0; Sodium 141   Lipid Panel    Component Value Date/Time   CHOL 154 09/12/2019 1140   CHOL 152 03/22/2013 0813   TRIG 96 09/12/2019 1140   TRIG 83 03/22/2013 0813   HDL 42 09/12/2019 1140   HDL 39 (L) 03/22/2013 0813   CHOLHDL 3.7 09/12/2019 1140   CHOLHDL 5.5 12/28/2017 1027   VLDL 43 (H) 12/28/2017 1027   LDLCALC 94 09/12/2019 1140   LDLCALC 96 03/22/2013 0813    Additional studies/ records that were reviewed today include:   Cath 08/03/2018  Prox LAD lesion is 45% stenosed.  Ost RPDA to RPDA lesion is 80% stenosed.  A drug-eluting stent was successfully placed using a STENT SYNERGY DES 2.25X12.  Post intervention, there is a 0% residual stenosis.  Ost LAD to Prox LAD lesion is 35% stenosed.  Prox Cx-1 lesion is 95% stenosed.  Post intervention, there is a 0% residual stenosis.  A drug-eluting stent was successfully placed using a STENT SYNERGY DES 3X12.  Non-stenotic Prox Cx-2 lesion was previously  treated.  Post Atrio lesion is 30% stenosed.  LV end diastolic pressure is normal.   1. 2 vessel obstructive CAD    - patent stent in the proximal LAD. Diffuse 45-50% mid LAD    - 95% stenosis in the LCx at the proximal stent margin    - 80% PDA 2. Normal LVEDP 3. Successful PCI of the LCx with DES x 1 overlapping the prior stent 4. Successful PCI of the PDA with DES x 1.   Plan: Anticipate same day discharge.   Recommend uninterrupted dual antiplatelet therapy with Aspirin 81mg  daily and Clopidogrel 75 mg daily for a minimum of 12 months (ACS - Class I recommendation).  Cath 06/05/19:  CORONARY STENT INTERVENTION  LEFT HEART CATH AND CORONARY ANGIOGRAPHY  Conclusion    Prox LAD lesion is 45% stenosed.  Ost LAD to Prox LAD lesion is 40% stenosed.  Non-stenotic Prox Cx-2 lesion was previously treated.  Previously placed Prox Cx-1 drug eluting stent is widely patent.  Prox RCA lesion is 25% stenosed.  Previously placed Ost RPDA to RPDA drug eluting stent is widely patent.  RPAV lesion is 30% stenosed.  Mid Cx lesion is 90% stenosed.  A drug-eluting stent was successfully placed using a STENT RESOLUTE ONYX 2.5X15.  Post intervention, there is a 0% residual stenosis.  The left ventricular  systolic function is normal.  LV end diastolic pressure is normal.  The left ventricular ejection fraction is 55-65% by visual estimate.   1. Single vessel obstructive CAD. 90% stenosis in the mid LCx distal to prior stents into the OM1 2. Continued stent patency in the LAD, PDA and proximal to mid LCx 3. Normal LV function 4. Normal LVEDP 5. Successful PCI of the mid LCx/OM1 with DES x 1 in overlapping fashion  Plan: DAPT indefinitely. He is a candidate for same day discharge. Needs referral to lipid clinic to address his hypercholesterolemia and intolerance to statins. With aggressive CAD target LDL <50.     Cardiac cath 06/07/19:  CORONARY STENT INTERVENTION   INTRAVASCULAR PRESSURE WIRE/FFR STUDY  LEFT HEART CATH AND CORONARY ANGIOGRAPHY  Conclusion    Ost LAD to Prox LAD lesion is 40% stenosed.  Non-stenotic Prox Cx-1 lesion was previously treated.  Previously placed Prox Cx-2 drug eluting stent is widely patent.  Balloon angioplasty was performed.  Previously placed Mid Cx drug eluting stent is widely patent.  Balloon angioplasty was performed.  Non-stenotic Ost RPDA to RPDA lesion was previously treated.  RPAV lesion is 30% stenosed.  Prox RCA lesion is 30% stenosed.  Prox LAD to Mid LAD lesion is 70% stenosed.  Post intervention, there is a 0% residual stenosis.  A drug-eluting stent was successfully placed using a STENT RESOLUTE ONYX 3.0X30.  The left ventricular systolic function is normal.  LV end diastolic pressure is normal.  The left ventricular ejection fraction is 55-65% by visual estimate.   1.  Widely patent left circumflex and RCA stents.  There is a stent in the proximal LAD which is patent with moderate in-stent restenosis in the proximal segment which was not significant by DFR (0.94).  In addition, the LAD has 70% stenosis in the midsegment distal to the previously placed stent.  This was significant by DFR (0.85) and thus this was treated with PCI and drug-eluting stent placement.  The stent overlapped with the previously placed proximal stent. 2.  Normal LV systolic function and left ventricular end-diastolic pressure.  Recommendations: Dual antiplatelet therapy for at least 6 months and preferably long-term given multiple overlapped stents. Recommend referral to the lipid clinic for treatment of hyperlipidemia.      ASSESSMENT:    No diagnosis found.   PLAN:  In order of problems listed above:  1. CAD s/p multiple prior stent procedures. S/p stent of LCx for restenosis in September 2020. He had stenting of the mid LAD for abnormal FFR 2 weeks later for continued chest pain. Denies any  current chest pain. Continue aspirin and Plavix indefinitely.   2. Hypertension: Blood pressure now well controlled on current therapy. Now on amlodipine 5 mg daily. Losartan 50 mg daily, and metoprolol 25 mg in the am and 50 mg in the pm.   3. Hyperlipidemia: With multiple prior stent procedures and aggressive CAD he needs aggressive lipid lowering therapy. Seen in lipid clinic. No on low dose Crestor 3x/week. Cannot tolerate higher dose. Will NOT consider injectables. Will repeat fasting lab. If not at goal will try and convince him to take Zetia.      Medication Adjustments/Labs and Tests Ordered: Current medicines are reviewed at length with the patient today.  Concerns regarding medicines are outlined above.  Medication changes, Labs and Tests ordered today are listed in the Patient Instructions below. There are no Patient Instructions on file for this visit.   Signed, Cage Gupton [name], MD  04/13/2020 4:03 PM    [facility] [address], [place], [place]  [address] Phone: [phone]; Fax: [phone]

## 2020-04-13 ENCOUNTER — Encounter: Payer: Self-pay | Admitting: Cardiology

## 2020-04-13 ENCOUNTER — Other Ambulatory Visit: Payer: Self-pay

## 2020-04-13 ENCOUNTER — Ambulatory Visit: Payer: PPO | Admitting: Cardiology

## 2020-04-13 VITALS — BP 134/72 | HR 70 | Temp 97.7°F | Ht 67.0 in | Wt 179.0 lb

## 2020-04-13 DIAGNOSIS — I10 Essential (primary) hypertension: Secondary | ICD-10-CM | POA: Diagnosis not present

## 2020-04-13 DIAGNOSIS — E782 Mixed hyperlipidemia: Secondary | ICD-10-CM | POA: Diagnosis not present

## 2020-04-13 DIAGNOSIS — Z9861 Coronary angioplasty status: Secondary | ICD-10-CM

## 2020-04-13 DIAGNOSIS — I2510 Atherosclerotic heart disease of native coronary artery without angina pectoris: Secondary | ICD-10-CM

## 2020-04-20 DIAGNOSIS — I2510 Atherosclerotic heart disease of native coronary artery without angina pectoris: Secondary | ICD-10-CM | POA: Diagnosis not present

## 2020-04-20 DIAGNOSIS — Z9861 Coronary angioplasty status: Secondary | ICD-10-CM | POA: Diagnosis not present

## 2020-04-20 DIAGNOSIS — I10 Essential (primary) hypertension: Secondary | ICD-10-CM | POA: Diagnosis not present

## 2020-04-20 DIAGNOSIS — E782 Mixed hyperlipidemia: Secondary | ICD-10-CM | POA: Diagnosis not present

## 2020-04-20 LAB — BASIC METABOLIC PANEL
BUN/Creatinine Ratio: 20 (ref 10–24)
BUN: 18 mg/dL (ref 8–27)
CO2: 24 mmol/L (ref 20–29)
Calcium: 9.5 mg/dL (ref 8.6–10.2)
Chloride: 101 mmol/L (ref 96–106)
Creatinine, Ser: 0.89 mg/dL (ref 0.76–1.27)
GFR calc Af Amer: 100 mL/min/{1.73_m2} (ref >59)
GFR calc non Af Amer: 87 mL/min/{1.73_m2} (ref >59)
Glucose: 95 mg/dL (ref 65–99)
Potassium: 5.1 mmol/L (ref 3.5–5.2)
Sodium: 139 mmol/L (ref 134–144)

## 2020-04-20 LAB — LIPID PANEL
Chol/HDL Ratio: 4 ratio (ref 0.0–5.0)
Cholesterol, Total: 159 mg/dL (ref 100–199)
HDL: 40 mg/dL (ref >39)
LDL Chol Calc (NIH): 97 mg/dL (ref 0–99)
Triglycerides: 124 mg/dL (ref 0–149)
VLDL Cholesterol Cal: 22 mg/dL (ref 5–40)

## 2020-04-20 LAB — HEPATIC FUNCTION PANEL
ALT: 21 IU/L (ref 0–44)
AST: 17 IU/L (ref 0–40)
Albumin: 4.8 g/dL (ref 3.8–4.8)
Alkaline Phosphatase: 96 IU/L (ref 48–121)
Bilirubin Total: 0.5 mg/dL (ref 0.0–1.2)
Bilirubin, Direct: 0.15 mg/dL (ref 0.00–0.40)
Total Protein: 6.6 g/dL (ref 6.0–8.5)

## 2020-04-23 ENCOUNTER — Other Ambulatory Visit: Payer: Self-pay

## 2020-04-23 DIAGNOSIS — Z9861 Coronary angioplasty status: Secondary | ICD-10-CM

## 2020-04-23 DIAGNOSIS — E782 Mixed hyperlipidemia: Secondary | ICD-10-CM

## 2020-04-23 MED ORDER — EZETIMIBE 10 MG PO TABS: 10.0000 mg | ORAL_TABLET | Freq: Every day | ORAL | 3 refills | Status: DC

## 2020-04-23 NOTE — Progress Notes (Signed)
zetia 

## 2020-05-30 ENCOUNTER — Other Ambulatory Visit: Payer: Self-pay | Admitting: Physician Assistant

## 2020-06-13 ENCOUNTER — Other Ambulatory Visit: Payer: Self-pay | Admitting: Cardiology

## 2020-07-11 ENCOUNTER — Other Ambulatory Visit: Payer: Self-pay | Admitting: Physician Assistant

## 2020-07-14 ENCOUNTER — Telehealth: Payer: Self-pay

## 2020-07-14 MED ORDER — AMLODIPINE BESYLATE 10 MG PO TABS
5.0000 mg | ORAL_TABLET | Freq: Every day | ORAL | 3 refills | Status: DC
Start: 2020-07-14 — End: 2021-09-10

## 2020-07-14 NOTE — Telephone Encounter (Signed)
Amlodipine refill sent to pharmacy 

## 2020-07-24 DIAGNOSIS — Z9861 Coronary angioplasty status: Secondary | ICD-10-CM | POA: Diagnosis not present

## 2020-07-24 DIAGNOSIS — I2510 Atherosclerotic heart disease of native coronary artery without angina pectoris: Secondary | ICD-10-CM | POA: Diagnosis not present

## 2020-07-24 DIAGNOSIS — E782 Mixed hyperlipidemia: Secondary | ICD-10-CM | POA: Diagnosis not present

## 2020-07-24 LAB — HEPATIC FUNCTION PANEL
ALT: 17 IU/L (ref 0–44)
AST: 20 IU/L (ref 0–40)
Albumin: 4.6 g/dL (ref 3.8–4.8)
Alkaline Phosphatase: 95 IU/L (ref 44–121)
Bilirubin Total: 0.5 mg/dL (ref 0.0–1.2)
Bilirubin, Direct: 0.17 mg/dL (ref 0.00–0.40)
Total Protein: 6.5 g/dL (ref 6.0–8.5)

## 2020-07-24 LAB — LIPID PANEL
Chol/HDL Ratio: 3.2 ratio (ref 0.0–5.0)
Cholesterol, Total: 126 mg/dL (ref 100–199)
HDL: 40 mg/dL (ref >39)
LDL Chol Calc (NIH): 71 mg/dL (ref 0–99)
Triglycerides: 77 mg/dL (ref 0–149)
VLDL Cholesterol Cal: 15 mg/dL (ref 5–40)

## 2020-08-29 ENCOUNTER — Other Ambulatory Visit: Payer: Self-pay | Admitting: Cardiology

## 2020-09-11 ENCOUNTER — Other Ambulatory Visit: Payer: Self-pay | Admitting: Physician Assistant

## 2020-10-16 ENCOUNTER — Ambulatory Visit: Payer: PPO | Admitting: Cardiology

## 2020-10-29 NOTE — Telephone Encounter (Signed)
Spoke with pt regarding message left on his phone about his upcoming appointment. Pt states that he tried to call the number back and he was unable to get through so he sent a mychart message to get through to someone. Can not find an encounter for reason for call. Will route to scheduling for follow up.

## 2020-12-18 NOTE — Progress Notes (Signed)
Cardiology Office Note    Date:  12/21/2020   ID:  [name], [name] 1951/05/27, MRN [id number]  PCP:  [name], MD  Cardiologist:  Dr. [name]  Chief Complaint  Patient presents with  . Coronary Artery Disease    History of Present Illness:  [name] is a 70 y.o. male with PMH of CAD, HTN, HLD (intolerant of statins), sinus bradycardia, history of provoked DVT, PVCs on previous 48-hour monitor, mild carotid artery disease and prostate cancer.  Previous cardiac catheterization performed in the setting of an STEMI on 09/21/2017 showed patent LAD stent with 50% mid LAD lesion, 60% mid left circumflex lesion, and 60% PDA lesion.  PDA lesion is unchanged from 2012.  Left circumflex lesion was significant by FFR and he underwent PCI with DES.  Later that night he had recurrent chest pain and was taken back to the Cath Lab.  Cardiac catheterization revealed patent left circumflex stent with dissection into the OM 3.  Medical therapy was planned as any reattempt with crossing with a wire could lead to worsening dissection.  Due to persistent symptoms, he underwent repeat cardiac cath on 01/11/2018, that showed widely patent stents, the wire dissection had healed completely, EF was normal at the time.  He underwent repeat cardiac cath on 08/03/2018 due to exertional chest discomfort.  This showed new restenosis in the left circumflex at the proximal stent margin, there was also progression of disease in the PDA.  Both side were treated with DES.  He was enrolled in [place] 4 trial.  Based on the last note in February 2020, he did not wish to continue in [place] 4 trial due to myalgia after injection.  He was last seen by me on 08/22/2018 at which time he was doing well.  He was seen in early September  recurrent exertional chest tightness and dyspnea since August 7th.  No resting chest pain. He underwent repeat cardiac cath on 06/05/19 showing a new stenosis in the LCx just distal to prior stent.  This was treated with repeat DES. Following cath he had persistent chests pain and was actually readmitted. Ecg was nonacute. Troponin was not c/w ACS. He did undergo repeat cath showing patent stents. There was moderate mid LAD disease that was chronic and unchanged from prior studies. He did have an abnormal Flow wire assessment so this was stented as well. DC home on 06/08/19. Amlodipine dose increased for BP.  We later increased losartan to 50 mg daily for BP. He was seen by Pharm D for lipid therapy. Multiple options reviewed. He wanted to rechallenge with low dose Crestor with possible addition of Zetia if not at goal.   We did try him on Zetia. He states he took it for one week and had leg pain so bad he couldn't walk. Also constipated. He is on Crestor 3 days a week. He states he got the Covid booster in November for the next 6-8 weeks he states he had bad daily chest pain. Did get better with sl Ntg. Was SOB and just felt bad. Did not seek attention and pain has since resolved. No longer SOB. Feels well.     Past Medical History:  Diagnosis Date  . Abnormality of aortic valve    a. possibly bicuspid.  . Asthma    Childhood  . Carotid artery disease ([facility])    a. 1-39% bilaterally in 2017.  Marland Kitchen Coronary artery disease    a. anterior STEMI 04/2011, s/p DES  to LAD.  Marland Kitchen DVT (deep venous thrombosis) ([facility])    a. tx with Xarelto x 5 months, stopped due to hematuria.  . Frequent PVCs    a. seen on Holter monitor.  . Hyperlipidemia, mixed   . Hypertension   . Myocardial infarction ([facility]) 05/21/2011   anterior wall, treated by Dr. [name] with 3.0x65mm Promus drug eluting stent to proximal LAD   . Peripheral arterial disease ([facility])    moderate left ICA disease, mild right ICA disease  . Prostate cancer ([facility]) 2007  . Sinus bradycardia    a. HR 50s at home.  . Statin intolerance   . Unstable angina ([facility]) 08/03/2018    Past Surgical History:  Procedure Laterality Date  . CARDIAC  CATHETERIZATION  05/23/2011, 05/21/2011   05/21/2011   3.0 x 55mm Promus Element drug eluting stent positioned in proximal LAD, stenosis taken from 99% to 0%.  . CORONARY STENT INTERVENTION N/A 09/21/2017   Procedure: CORONARY STENT INTERVENTION;  Surgeon: [name], MD;  Location: [facility];  Service: Cardiovascular;  Laterality: N/A;  . CORONARY STENT INTERVENTION N/A 08/03/2018   Procedure: CORONARY STENT INTERVENTION;  Surgeon: Sheppard, Kyle M, MD;  Location: [facility];  Service: Cardiovascular;  Laterality: N/A;  . CORONARY STENT INTERVENTION N/A 06/05/2019   Procedure: CORONARY STENT INTERVENTION;  Surgeon: Sheppard, Kyle M, MD;  Location: [facility];  Service: Cardiovascular;  Laterality: N/A;  . CORONARY STENT INTERVENTION N/A 06/07/2019   Procedure: CORONARY STENT INTERVENTION;  Surgeon: Wellington Hampshire, MD;  Location: [facility];  Service: Cardiovascular;  Laterality: N/A;  . CORONARY/GRAFT ACUTE MI REVASCULARIZATION N/A 09/21/2017   Procedure: Coronary/Graft Acute MI Revascularization;  Surgeon: Sheppard, Kyle M, MD;  Location: [facility];  Service: Cardiovascular;  Laterality: N/A;  . HERNIA REPAIR    . INTRAVASCULAR PRESSURE WIRE/FFR STUDY N/A 09/21/2017   Procedure: INTRAVASCULAR PRESSURE WIRE/FFR STUDY;  Surgeon: [name], MD;  Location: [facility];  Service: Cardiovascular;  Laterality: N/A;  . INTRAVASCULAR PRESSURE WIRE/FFR STUDY N/A 06/07/2019   Procedure: INTRAVASCULAR PRESSURE WIRE/FFR STUDY;  Surgeon: Wellington Hampshire, MD;  Location: [facility];  Service: Cardiovascular;  Laterality: N/A;  . LEFT HEART CATH AND CORONARY ANGIOGRAPHY N/A 09/21/2017   Procedure: LEFT HEART CATH AND CORONARY ANGIOGRAPHY;  Surgeon: Sheppard, Kyle M, MD;  Location: [facility];  Service: Cardiovascular;  Laterality: N/A;  . LEFT HEART CATH AND CORONARY ANGIOGRAPHY N/A 09/21/2017   Procedure: LEFT HEART CATH AND CORONARY  ANGIOGRAPHY;  Surgeon: [name], MD;  Location: [facility];  Service: Cardiovascular;  Laterality: N/A;  . LEFT HEART CATH AND CORONARY ANGIOGRAPHY N/A 01/11/2018   Procedure: LEFT HEART CATH AND CORONARY ANGIOGRAPHY;  Surgeon: Sheppard, Kyle M, MD;  Location: [facility];  Service: Cardiovascular;  Laterality: N/A;  . LEFT HEART CATH AND CORONARY ANGIOGRAPHY N/A 08/03/2018   Procedure: LEFT HEART CATH AND CORONARY ANGIOGRAPHY;  Surgeon: Sheppard, Kyle M, MD;  Location: [facility];  Service: Cardiovascular;  Laterality: N/A;  . LEFT HEART CATH AND CORONARY ANGIOGRAPHY N/A 06/05/2019   Procedure: LEFT HEART CATH AND CORONARY ANGIOGRAPHY;  Surgeon: Sheppard, Kyle M, MD;  Location: [facility];  Service: Cardiovascular;  Laterality: N/A;  . LEFT HEART CATH AND CORONARY ANGIOGRAPHY N/A 06/07/2019   Procedure: LEFT HEART CATH AND CORONARY ANGIOGRAPHY;  Surgeon: Wellington Hampshire, MD;  Location: [facility];  Service: Cardiovascular;  Laterality: N/A;  . PROSTATECTOMY  2007    Current Medications: Outpatient Medications Prior to Visit  Medication Sig Dispense Refill  . amLODipine (NORVASC) 10 MG tablet Take 0.5 tablets (5 mg total) by mouth daily. 90 tablet 3  . aspirin 81 MG tablet Take 81 mg by mouth daily.    . clopidogrel (PLAVIX) 75 MG tablet TAKE 1 TABLET BY MOUTH ONCE A DAY. 90 tablet 2  . Coenzyme Q10 (CO Q-10) 100 MG CAPS Take 100 mg by mouth daily.     Marland Kitchen losartan (COZAAR) 50 MG tablet TAKE ONE TABLET BY MOUTH ONCE DAILY. 90 tablet 3  . metoprolol tartrate (LOPRESSOR) 25 MG tablet Take 1 tablet (25 mg total) by mouth 2 (two) times daily AND 1 tablet (25 mg total) every evening. 135 tablet 3  . Multiple Vitamin (MULTIVITAMIN WITH MINERALS) TABS tablet Take 1 tablet by mouth daily.    . nitroGLYCERIN (NITROSTAT) 0.4 MG SL tablet PLACE 1 UNDER TONGUE EVERY 5 MINUTES UP TO 3 DOSES AS NEEDED FOR CHEST PAIN. 25 tablet 0  . rosuvastatin (CRESTOR) 10 MG tablet  TAKE (1) TABLET BY MOUTH DAILY ON MONDAY, WEDNESDAY AND FRIDAY. 36 tablet 3  . ezetimibe (ZETIA) 10 MG tablet Take 1 tablet (10 mg total) by mouth daily. 90 tablet 3   No facility-administered medications prior to visit.     Allergies:   Adhesive [tape], Demerol [meperidine], Fentanyl, and Statins   Social History   Socioeconomic History  . Marital status: Married    Spouse name: Not on file  . Number of children: 0  . Years of education: Not on file  . Highest education level: Not on file  Occupational History  . Not on file  Tobacco Use  . Smoking status: Former Smoker    Packs/day: 1.00    Years: 7.00    Pack years: 7.00    Types: Cigarettes    Start date: 03/05/1970    Quit date: 09/26/1976    Years since quitting: 44.2  . Smokeless tobacco: Never Used  Vaping Use  . Vaping Use: Never used  Substance and Sexual Activity  . Alcohol use: No    Alcohol/week: 0.0 standard drinks  . Drug use: No  . Sexual activity: Never  Other Topics Concern  . Not on file  Social History Narrative  . Not on file   Social Determinants of Health   Financial Resource Strain: Not on file  Food Insecurity: Not on file  Transportation Needs: Not on file  Physical Activity: Not on file  Stress: Not on file  Social Connections: Not on file     Family History:  The patient's family history includes Heart failure in his father; Leukemia in his mother.   ROS:   Please see the history of present illness.    ROS All other systems reviewed and are negative.   PHYSICAL EXAM:   VS:  BP 126/80 (BP Location: Left Arm, Patient Position: Sitting)   Pulse 62   Ht 5\' 7"  (1.702 m)   Wt 183 lb 6.4 oz (83.2 kg)   SpO2 95%   BMI 28.72 kg/m    GEN: Well nourished, well developed, in no acute distress  HEENT: normal  Neck: no JVD, carotid bruits, or masses Cardiac: RRR; no murmurs, rubs, or gallops,no edema  Respiratory:  clear to auscultation bilaterally, normal work of breathing GI: soft,  nontender, nondistended, + BS MS: no deformity or atrophy Skin:  warm and dry, no rash Neuro:  Alert and Oriented x 3, Strength and sensation are intact Psych: euthymic mood, full affect  Wt Readings from Last 3 Encounters:  12/21/20 183 lb 6.4 oz (83.2 kg)  04/13/20 179 lb (81.2 kg)  09/12/19 181 lb (82.1 kg)      Studies/Labs Reviewed:   EKG:  EKG is  ordered today.  NSR, LAD. No acute change. I have personally reviewed and interpreted this study.   Recent Labs: 04/20/2020: BUN 18; Creatinine, Ser 0.89; Potassium 5.1; Sodium 139 07/24/2020: ALT 17   Lipid Panel    Component Value Date/Time   CHOL 126 07/24/2020 0903   CHOL 152 03/22/2013 0813   TRIG 77 07/24/2020 0903   TRIG 83 03/22/2013 0813   HDL 40 07/24/2020 0903   HDL 39 (L) 03/22/2013 0813   CHOLHDL 3.2 07/24/2020 0903   CHOLHDL 5.5 12/28/2017 1027   VLDL 43 (H) 12/28/2017 1027   LDLCALC 71 07/24/2020 0903   LDLCALC 96 03/22/2013 0813    Additional studies/ records that were reviewed today include:   Cath 08/03/2018  Prox LAD lesion is 45% stenosed.  Ost RPDA to RPDA lesion is 80% stenosed.  A drug-eluting stent was successfully placed using a STENT SYNERGY DES 2.25X12.  Post intervention, there is a 0% residual stenosis.  Ost LAD to Prox LAD lesion is 35% stenosed.  Prox Cx-1 lesion is 95% stenosed.  Post intervention, there is a 0% residual stenosis.  A drug-eluting stent was successfully placed using a STENT SYNERGY DES 3X12.  Non-stenotic Prox Cx-2 lesion was previously treated.  Post Atrio lesion is 30% stenosed.  LV end diastolic pressure is normal.   1. 2 vessel obstructive CAD    - patent stent in the proximal LAD. Diffuse 45-50% mid LAD    - 95% stenosis in the LCx at the proximal stent margin    - 80% PDA 2. Normal LVEDP 3. Successful PCI of the LCx with DES x 1 overlapping the prior stent 4. Successful PCI of the PDA with DES x 1.   Plan: Anticipate same day discharge.    Recommend uninterrupted dual antiplatelet therapy with Aspirin 81mg  daily and Clopidogrel 75 mg daily for a minimum of 12 months (ACS - Class I recommendation).  Cath 06/05/19:  CORONARY STENT INTERVENTION  LEFT HEART CATH AND CORONARY ANGIOGRAPHY  Conclusion    Prox LAD lesion is 45% stenosed.  Ost LAD to Prox LAD lesion is 40% stenosed.  Non-stenotic Prox Cx-2 lesion was previously treated.  Previously placed Prox Cx-1 drug eluting stent is widely patent.  Prox RCA lesion is 25% stenosed.  Previously placed Ost RPDA to RPDA drug eluting stent is widely patent.  RPAV lesion is 30% stenosed.  Mid Cx lesion is 90% stenosed.  A drug-eluting stent was successfully placed using a STENT RESOLUTE ONYX 2.5X15.  Post intervention, there is a 0% residual stenosis.  The left ventricular systolic function is normal.  LV end diastolic pressure is normal.  The left ventricular ejection fraction is 55-65% by visual estimate.   1. Single vessel obstructive CAD. 90% stenosis in the mid LCx distal to prior stents into the OM1 2. Continued stent patency in the LAD, PDA and proximal to mid LCx 3. Normal LV function 4. Normal LVEDP 5. Successful PCI of the mid LCx/OM1 with DES x 1 in overlapping fashion  Plan: DAPT indefinitely. He is a candidate for same day discharge. Needs referral to lipid clinic to address his  hypercholesterolemia and intolerance to statins. With aggressive CAD target LDL <50.     Cardiac cath 06/07/19:  CORONARY STENT INTERVENTION  INTRAVASCULAR PRESSURE WIRE/FFR STUDY  LEFT HEART CATH AND CORONARY ANGIOGRAPHY  Conclusion    Ost LAD to Prox LAD lesion is 40% stenosed.  Non-stenotic Prox Cx-1 lesion was previously treated.  Previously placed Prox Cx-2 drug eluting stent is widely patent.  Balloon angioplasty was performed.  Previously placed Mid Cx drug eluting stent is widely patent.  Balloon angioplasty was performed.  Non-stenotic Ost RPDA to  RPDA lesion was previously treated.  RPAV lesion is 30% stenosed.  Prox RCA lesion is 30% stenosed.  Prox LAD to Mid LAD lesion is 70% stenosed.  Post intervention, there is a 0% residual stenosis.  A drug-eluting stent was successfully placed using a STENT RESOLUTE ONYX 3.0X30.  The left ventricular systolic function is normal.  LV end diastolic pressure is normal.  The left ventricular ejection fraction is 55-65% by visual estimate.   1.  Widely patent left circumflex and RCA stents.  There is a stent in the proximal LAD which is patent with moderate in-stent restenosis in the proximal segment which was not significant by DFR (0.94).  In addition, the LAD has 70% stenosis in the midsegment distal to the previously placed stent.  This was significant by DFR (0.85) and thus this was treated with PCI and drug-eluting stent placement.  The stent overlapped with the previously placed proximal stent. 2.  Normal LV systolic function and left ventricular end-diastolic pressure.  Recommendations: Dual antiplatelet therapy for at least 6 months and preferably long-term given multiple overlapped stents. Recommend referral to the lipid clinic for treatment of hyperlipidemia.      ASSESSMENT:    1. CAD S/P percutaneous coronary angioplasty   2. Essential hypertension   3. Mixed hyperlipidemia      PLAN:  In order of problems listed above:  1. CAD s/p multiple prior stent procedures. S/p stent of LCx for restenosis in September 2020. He had stenting of the mid LAD for abnormal FFR 2 weeks later for continued chest pain. Denies any current chest pain. Did have pain for 6-8 weeks after receiving Covid booster shot but never sought attention.  Ecg shows no change thankfully. Continue aspirin and Plavix indefinitely.   2.   Hypertension: Blood pressure now well controlled on current therapy.   3.   Hyperlipidemia: With multiple prior stent procedures and aggressive CAD he needs  aggressive lipid lowering therapy. Seen in lipid clinic. No on low dose Crestor 3x/week. Cannot tolerate higher dose. Will NOT consider injectables. Intolerant of Zetia. Only other option would be Nexlitol but he isn't inclined to try it.      Medication Adjustments/Labs and Tests Ordered: Current medicines are reviewed at length with the patient today.  Concerns regarding medicines are outlined above.  Medication changes, Labs and Tests ordered today are listed in the Patient Instructions below. There are no Patient Instructions on file for this visit.   Signed, Kyle [name], MD  12/21/2020 4:00 PM    [facility] [place], [place], [place]  [address] Phone: [phone]; Fax: [phone]

## 2020-12-21 ENCOUNTER — Ambulatory Visit: Payer: PPO | Admitting: Cardiology

## 2020-12-21 ENCOUNTER — Encounter: Payer: Self-pay | Admitting: Cardiology

## 2020-12-21 ENCOUNTER — Other Ambulatory Visit: Payer: Self-pay

## 2020-12-21 VITALS — BP 126/80 | HR 62 | Ht 67.0 in | Wt 183.4 lb

## 2020-12-21 DIAGNOSIS — Z9861 Coronary angioplasty status: Secondary | ICD-10-CM | POA: Diagnosis not present

## 2020-12-21 DIAGNOSIS — E782 Mixed hyperlipidemia: Secondary | ICD-10-CM

## 2020-12-21 DIAGNOSIS — I10 Essential (primary) hypertension: Secondary | ICD-10-CM

## 2020-12-21 DIAGNOSIS — I2510 Atherosclerotic heart disease of native coronary artery without angina pectoris: Secondary | ICD-10-CM | POA: Diagnosis not present

## 2020-12-26 ENCOUNTER — Other Ambulatory Visit: Payer: Self-pay | Admitting: Cardiology

## 2021-01-25 DIAGNOSIS — H5201 Hypermetropia, right eye: Secondary | ICD-10-CM | POA: Diagnosis not present

## 2021-01-25 DIAGNOSIS — H25013 Cortical age-related cataract, bilateral: Secondary | ICD-10-CM | POA: Diagnosis not present

## 2021-01-25 DIAGNOSIS — H2513 Age-related nuclear cataract, bilateral: Secondary | ICD-10-CM | POA: Diagnosis not present

## 2021-01-25 DIAGNOSIS — H43813 Vitreous degeneration, bilateral: Secondary | ICD-10-CM | POA: Diagnosis not present

## 2021-03-09 DIAGNOSIS — D225 Melanocytic nevi of trunk: Secondary | ICD-10-CM | POA: Diagnosis not present

## 2021-03-09 DIAGNOSIS — L821 Other seborrheic keratosis: Secondary | ICD-10-CM | POA: Diagnosis not present

## 2021-05-28 ENCOUNTER — Other Ambulatory Visit: Payer: Self-pay | Admitting: Cardiology

## 2021-06-05 ENCOUNTER — Other Ambulatory Visit: Payer: Self-pay | Admitting: Cardiology

## 2021-07-09 ENCOUNTER — Other Ambulatory Visit: Payer: Self-pay | Admitting: Physician Assistant

## 2021-08-20 ENCOUNTER — Other Ambulatory Visit: Payer: Self-pay | Admitting: Cardiology

## 2021-09-06 NOTE — Progress Notes (Signed)
Cardiology Office Note    Date:  09/10/2021   ID:  [name], DOB Aug 08, 1951, MRN [id number]  PCP:  [name], MD  Cardiologist:  Dr. [name]  Chief Complaint  Patient presents with   Coronary Artery Disease    History of Present Illness:  [name] is a 70 y.o. male with PMH of CAD, HTN, HLD (intolerant of statins), sinus bradycardia, history of provoked DVT, PVCs on previous 48-hour monitor, mild carotid artery disease and prostate cancer.  Previous cardiac catheterization performed in the setting of an STEMI on 09/21/2017 showed patent LAD stent with 50% mid LAD lesion, 60% mid left circumflex lesion, and 60% PDA lesion.  PDA lesion is unchanged from 2012.  Left circumflex lesion was significant by FFR and he underwent PCI with DES.  Later that night he had recurrent chest pain and was taken back to the Cath Lab.  Cardiac catheterization revealed patent left circumflex stent with dissection into the OM 3.  Medical therapy was planned as any reattempt with crossing with a wire could lead to worsening dissection.  Due to persistent symptoms, he underwent repeat cardiac cath on 01/11/2018, that showed widely patent stents, the wire dissection had healed completely, EF was normal at the time.  He underwent repeat cardiac cath on 08/03/2018 due to exertional chest discomfort.  This showed new restenosis in the left circumflex at the proximal stent margin, there was also progression of disease in the PDA.  Both sites were treated with DES.  He was enrolled in [place] 4 trial.  Based on the last note in February 2020, he did not wish to continue in [place] 4 trial due to myalgia after injection.  He was last seen by me on 08/22/2018 at which time he was doing well.  He was seen in early September  recurrent exertional chest tightness and dyspnea since August 7th.  No resting chest pain. He underwent repeat cardiac cath on 06/05/19 showing a new stenosis in the LCx just distal to prior stent.  This was treated with repeat DES. Following cath he had persistent chests pain and was actually readmitted. Ecg was nonacute. Troponin was not c/w ACS. He did undergo repeat cath showing patent stents. There was moderate mid LAD disease that was chronic and unchanged from prior studies. He did have an abnormal Flow wire assessment so this was stented as well. DC home on 06/08/19. Amlodipine dose increased for BP.  We later increased losartan to 50 mg daily for BP. He was seen by Pharm D for lipid therapy. Multiple options reviewed. He wanted to rechallenge with low dose Crestor with possible addition of Zetia if not at goal.   We did try him on Zetia. He states he took it for one week and had leg pain so bad he couldn't walk. States he developed aching all over. Stopped Crestor and it improved but is still present.  He denies any significant chest pain. Only some tightness if he eats a lot. Notes BP is elevated in the evening 644-034 systolic. Willing to give low dose lipitor another try.     Past Medical History:  Diagnosis Date   Abnormality of aortic valve    a. possibly bicuspid.   Asthma    Childhood   Carotid artery disease ([place])    a. 1-39% bilaterally in 2017.   Coronary artery disease    a. anterior STEMI 04/2011, s/p DES to LAD.   DVT (deep venous thrombosis) ([place])  a. tx with Xarelto x 5 months, stopped due to hematuria.   Frequent PVCs    a. seen on Holter monitor.   Hyperlipidemia, mixed    Hypertension    Myocardial infarction ([place]) 05/21/2011   anterior wall, treated by Dr. [name] with 3.0x29mm Promus drug eluting stent to proximal LAD    Peripheral arterial disease ([place])    moderate left ICA disease, mild right ICA disease   Prostate cancer ([place]) 2007   Sinus bradycardia    a. HR 50s at home.   Statin intolerance    Unstable angina ([place]) 08/03/2018    Past Surgical History:  Procedure Laterality Date   CARDIAC CATHETERIZATION  05/23/2011, 05/21/2011    05/21/2011   3.0 x 58mm Promus Element drug eluting stent positioned in proximal LAD, stenosis taken from 99% to 0%.   CORONARY STENT INTERVENTION N/A 09/21/2017   Procedure: CORONARY STENT INTERVENTION;  Surgeon: [name], MD;  Location: [facility];  Service: Cardiovascular;  Laterality: N/A;   CORONARY STENT INTERVENTION N/A 08/03/2018   Procedure: CORONARY STENT INTERVENTION;  Surgeon: [name], Cephus Tupy M, MD;  Location: [facility];  Service: Cardiovascular;  Laterality: N/A;   CORONARY STENT INTERVENTION N/A 06/05/2019   Procedure: CORONARY STENT INTERVENTION;  Surgeon: [name], Lundy Cozart M, MD;  Location: [facility];  Service: Cardiovascular;  Laterality: N/A;   CORONARY STENT INTERVENTION N/A 06/07/2019   Procedure: CORONARY STENT INTERVENTION;  Surgeon: Wellington Hampshire, MD;  Location: [facility];  Service: Cardiovascular;  Laterality: N/A;   CORONARY/GRAFT ACUTE MI REVASCULARIZATION N/A 09/21/2017   Procedure: Coronary/Graft Acute MI Revascularization;  Surgeon: [name], Kameria Canizares M, MD;  Location: [facility];  Service: Cardiovascular;  Laterality: N/A;   HERNIA REPAIR     INTRAVASCULAR PRESSURE WIRE/FFR STUDY N/A 09/21/2017   Procedure: INTRAVASCULAR PRESSURE WIRE/FFR STUDY;  Surgeon: [name], MD;  Location: [facility];  Service: Cardiovascular;  Laterality: N/A;   INTRAVASCULAR PRESSURE WIRE/FFR STUDY N/A 06/07/2019   Procedure: INTRAVASCULAR PRESSURE WIRE/FFR STUDY;  Surgeon: Wellington Hampshire, MD;  Location: [facility];  Service: Cardiovascular;  Laterality: N/A;   LEFT HEART CATH AND CORONARY ANGIOGRAPHY N/A 09/21/2017   Procedure: LEFT HEART CATH AND CORONARY ANGIOGRAPHY;  Surgeon: [name], Cierra Rothgeb M, MD;  Location: [facility];  Service: Cardiovascular;  Laterality: N/A;   LEFT HEART CATH AND CORONARY ANGIOGRAPHY N/A 09/21/2017   Procedure: LEFT HEART CATH AND CORONARY ANGIOGRAPHY;  Surgeon: [name], MD;   Location: [facility];  Service: Cardiovascular;  Laterality: N/A;   LEFT HEART CATH AND CORONARY ANGIOGRAPHY N/A 01/11/2018   Procedure: LEFT HEART CATH AND CORONARY ANGIOGRAPHY;  Surgeon: [name], Hartley Wyke M, MD;  Location: [facility];  Service: Cardiovascular;  Laterality: N/A;   LEFT HEART CATH AND CORONARY ANGIOGRAPHY N/A 08/03/2018   Procedure: LEFT HEART CATH AND CORONARY ANGIOGRAPHY;  Surgeon: [name], Lamerle Jabs M, MD;  Location: [facility];  Service: Cardiovascular;  Laterality: N/A;   LEFT HEART CATH AND CORONARY ANGIOGRAPHY N/A 06/05/2019   Procedure: LEFT HEART CATH AND CORONARY ANGIOGRAPHY;  Surgeon: [name], Malasia Torain M, MD;  Location: [facility];  Service: Cardiovascular;  Laterality: N/A;   LEFT HEART CATH AND CORONARY ANGIOGRAPHY N/A 06/07/2019   Procedure: LEFT HEART CATH AND CORONARY ANGIOGRAPHY;  Surgeon: Wellington Hampshire, MD;  Location: [facility];  Service: Cardiovascular;  Laterality: N/A;   PROSTATECTOMY  2007    Current Medications: Outpatient Medications Prior to Visit  Medication Sig Dispense Refill   amLODipine (NORVASC) 5 MG tablet TAKE (1) TABLET BY MOUTH ONCE DAILY. 90 tablet 0   aspirin 81 MG tablet Take 81 mg by mouth daily.     clopidogrel (PLAVIX) 75 MG tablet TAKE 1 TABLET BY MOUTH ONCE A DAY. 90 tablet 0   Coenzyme Q10 (CO Q-10) 100 MG CAPS Take 100 mg by mouth daily.      metoprolol tartrate (LOPRESSOR) 25 MG tablet TAKE (1) TABLET BY MOUTH TWICE DAILY. 180 tablet 3   MODERNA COVID-19 BIVAL BOOSTER 50 MCG/0.5ML injection      Multiple Vitamin (MULTIVITAMIN WITH MINERALS) TABS tablet Take 1 tablet by mouth daily.     nitroGLYCERIN (NITROSTAT) 0.4 MG SL tablet PLACE 1 UNDER TONGUE EVERY 5 MINUTES UP TO 3 DOSES AS NEEDED FOR CHEST PAIN. 25 tablet 0   amLODipine (NORVASC) 10 MG tablet Take 0.5 tablets (5 mg total) by mouth daily. 90 tablet 3   losartan (COZAAR) 50 MG tablet TAKE ONE TABLET BY MOUTH ONCE DAILY. 90 tablet 0   rosuvastatin  (CRESTOR) 10 MG tablet TAKE (1) TABLET BY MOUTH DAILY ON MONDAY, WEDNESDAY AND FRIDAY. (Patient not taking: Reported on 09/10/2021) 36 tablet 3   No facility-administered medications prior to visit.     Allergies:   Adhesive [tape], Demerol [meperidine], Fentanyl, and Statins   Social History   Socioeconomic History   Marital status: Married    Spouse name: Not on file   Number of children: 0   Years of education: Not on file   Highest education level: Not on file  Occupational History   Not on file  Tobacco Use   Smoking status: Former    Packs/day: 1.00    Years: 7.00    Pack years: 7.00    Types: Cigarettes    Start date: 03/05/1970    Quit date: 09/26/1976    Years since quitting: 44.9   Smokeless tobacco: Never  Vaping Use   Vaping Use: Never used  Substance and Sexual Activity   Alcohol use: No    Alcohol/week: 0.0 standard drinks   Drug use: No   Sexual activity: Never  Other Topics Concern   Not on file  Social History Narrative   Not on file   Social Determinants of Health   Financial Resource Strain: Not on file  Food Insecurity: Not on file  Transportation Needs: Not on file  Physical Activity: Not on file  Stress: Not on file  Social Connections: Not on file     Family History:  The patient's family history includes Heart failure in his father; Leukemia in his mother.   ROS:   Please see the history of present illness.    ROS All other systems reviewed and are negative.   PHYSICAL EXAM:   VS:  BP 132/82   Pulse (!) 56   Ht 5\' 7"  (1.702 m)   Wt 180 lb 3.2 oz (81.7 kg)   SpO2 98%   BMI 28.22 kg/m    GEN: Well nourished, well developed, in no acute distress  HEENT: normal  Neck: no JVD, carotid bruits, or masses Cardiac: RRR; no murmurs, rubs, or gallops,no edema  Respiratory:  clear to auscultation bilaterally, normal work of breathing GI: soft, nontender, nondistended, + BS MS: no deformity or atrophy Skin: warm and dry, no rash Neuro:   Alert and Oriented x 3, Strength and sensation are intact  Psych: euthymic mood, full affect  Wt Readings from Last 3 Encounters:  09/10/21 180 lb 3.2 oz (81.7 kg)  12/21/20 183 lb 6.4 oz (83.2 kg)  04/13/20 179 lb (81.2 kg)      Studies/Labs Reviewed:   EKG:  EKG is not  ordered today   Recent Labs: No results found for requested labs within last 8760 hours.   Lipid Panel    Component Value Date/Time   CHOL 126 07/24/2020 0903   CHOL 152 03/22/2013 0813   TRIG 77 07/24/2020 0903   TRIG 83 03/22/2013 0813   HDL 40 07/24/2020 0903   HDL 39 (L) 03/22/2013 0813   CHOLHDL 3.2 07/24/2020 0903   CHOLHDL 5.5 12/28/2017 1027   VLDL 43 (H) 12/28/2017 1027   LDLCALC 71 07/24/2020 0903   LDLCALC 96 03/22/2013 0813    Additional studies/ records that were reviewed today include:   Cath 08/03/2018 Prox LAD lesion is 45% stenosed. Ost RPDA to RPDA lesion is 80% stenosed. A drug-eluting stent was successfully placed using a STENT SYNERGY DES 2.25X12. Post intervention, there is a 0% residual stenosis. Ost LAD to Prox LAD lesion is 35% stenosed. Prox Cx-1 lesion is 95% stenosed. Post intervention, there is a 0% residual stenosis. A drug-eluting stent was successfully placed using a STENT SYNERGY DES 3X12. Non-stenotic Prox Cx-2 lesion was previously treated. Post Atrio lesion is 30% stenosed. LV end diastolic pressure is normal.   1. 2 vessel obstructive CAD    - patent stent in the proximal LAD. Diffuse 45-50% mid LAD    - 95% stenosis in the LCx at the proximal stent margin    - 80% PDA 2. Normal LVEDP 3. Successful PCI of the LCx with DES x 1 overlapping the prior stent 4. Successful PCI of the PDA with DES x 1.    Plan: Anticipate same day discharge.    Recommend uninterrupted dual antiplatelet therapy with Aspirin 81mg  daily and Clopidogrel 75 mg daily for a minimum of 12 months (ACS - Class I recommendation).  Cath 06/05/19:  CORONARY STENT INTERVENTION  LEFT HEART  CATH AND CORONARY ANGIOGRAPHY  Conclusion    Prox LAD lesion is 45% stenosed. Ost LAD to Prox LAD lesion is 40% stenosed. Non-stenotic Prox Cx-2 lesion was previously treated. Previously placed Prox Cx-1 drug eluting stent is widely patent. Prox RCA lesion is 25% stenosed. Previously placed Ost RPDA to RPDA drug eluting stent is widely patent. RPAV lesion is 30% stenosed. Mid Cx lesion is 90% stenosed. A drug-eluting stent was successfully placed using a STENT RESOLUTE ONYX 2.5X15. Post intervention, there is a 0% residual stenosis. The left ventricular systolic function is normal. LV end diastolic pressure is normal. The left ventricular ejection fraction is 55-65% by visual estimate.   1. Single vessel obstructive CAD. 90% stenosis in the mid LCx distal to prior stents into the OM1 2. Continued stent patency in the LAD, PDA and proximal to mid LCx 3. Normal LV function 4. Normal LVEDP 5. Successful PCI of the mid LCx/OM1 with DES x 1 in overlapping fashion   Plan: DAPT indefinitely. He is a candidate for same day discharge. Needs referral to lipid clinic to address his hypercholesterolemia and intolerance to statins. With aggressive CAD target LDL <50.      Cardiac cath 06/07/19:  CORONARY STENT INTERVENTION  INTRAVASCULAR PRESSURE WIRE/FFR STUDY  LEFT HEART CATH AND CORONARY ANGIOGRAPHY  Conclusion    Ost LAD to Prox LAD lesion is 40% stenosed. Non-stenotic Prox  Cx-1 lesion was previously treated. Previously placed Prox Cx-2 drug eluting stent is widely patent. Balloon angioplasty was performed. Previously placed Mid Cx drug eluting stent is widely patent. Balloon angioplasty was performed. Non-stenotic Ost RPDA to RPDA lesion was previously treated. RPAV lesion is 30% stenosed. Prox RCA lesion is 30% stenosed. Prox LAD to Mid LAD lesion is 70% stenosed. Post intervention, there is a 0% residual stenosis. A drug-eluting stent was successfully placed using a STENT  RESOLUTE ONYX 3.0X30. The left ventricular systolic function is normal. LV end diastolic pressure is normal. The left ventricular ejection fraction is 55-65% by visual estimate.   1.  Widely patent left circumflex and RCA stents.  There is a stent in the proximal LAD which is patent with moderate in-stent restenosis in the proximal segment which was not significant by DFR (0.94).  In addition, the LAD has 70% stenosis in the midsegment distal to the previously placed stent.  This was significant by DFR (0.85) and thus this was treated with PCI and drug-eluting stent placement.  The stent overlapped with the previously placed proximal stent. 2.  Normal LV systolic function and left ventricular end-diastolic pressure.   Recommendations: Dual antiplatelet therapy for at least 6 months and preferably long-term given multiple overlapped stents. Recommend referral to the lipid clinic for treatment of hyperlipidemia.       ASSESSMENT:    1. CAD S/P percutaneous coronary angioplasty   2. Mixed hyperlipidemia   3. Essential hypertension      PLAN:  In order of problems listed above:  CAD s/p multiple prior stent procedures. S/p stent of LCx for restenosis in September 2020. He had stenting of the mid LAD for abnormal FFR 2 weeks later for continued chest pain. Denies any current chest pain. Continue aspirin and Plavix indefinitely.   2.   Hypertension: Blood pressure is elevated in the pm. Will increase losartan to 100 mg daily   3.   Hyperlipidemia: With multiple prior stent procedures and aggressive CAD he needs aggressive lipid lowering therapy. Seen in lipid clinic. Intolerant of Crestor, Zetia and Inclisaran ([place] trial injection) will go ahead and try lipitor 10 mg three days a week.      Medication Adjustments/Labs and Tests Ordered: Current medicines are reviewed at length with the patient today.  Concerns regarding medicines are outlined above.  Medication changes, Labs and Tests  ordered today are listed in the Patient Instructions below. Patient Instructions  Resume lipitor 10 mg three times a week  Increase losartan to 100 mg daily for blood pressure    Signed, Westen Dinino [name], MD  09/10/2021 8:44 AM    [facility] [place], [place],   [address] Phone: [phone]; Fax: [phone]

## 2021-09-10 ENCOUNTER — Other Ambulatory Visit: Payer: Self-pay

## 2021-09-10 ENCOUNTER — Ambulatory Visit: Payer: PPO | Admitting: Cardiology

## 2021-09-10 ENCOUNTER — Encounter: Payer: Self-pay | Admitting: Cardiology

## 2021-09-10 VITALS — BP 132/82 | HR 56 | Ht 67.0 in | Wt 180.2 lb

## 2021-09-10 DIAGNOSIS — I2510 Atherosclerotic heart disease of native coronary artery without angina pectoris: Secondary | ICD-10-CM

## 2021-09-10 DIAGNOSIS — E782 Mixed hyperlipidemia: Secondary | ICD-10-CM | POA: Diagnosis not present

## 2021-09-10 DIAGNOSIS — I10 Essential (primary) hypertension: Secondary | ICD-10-CM | POA: Diagnosis not present

## 2021-09-10 DIAGNOSIS — Z9861 Coronary angioplasty status: Secondary | ICD-10-CM

## 2021-09-10 MED ORDER — ATORVASTATIN CALCIUM 10 MG PO TABS: 10.0000 mg | ORAL_TABLET | ORAL | 3 refills | Status: DC

## 2021-09-10 MED ORDER — LOSARTAN POTASSIUM 100 MG PO TABS: 100.0000 mg | ORAL_TABLET | Freq: Every day | ORAL | 3 refills | Status: DC

## 2021-09-10 NOTE — Patient Instructions (Signed)
Resume lipitor 10 mg three times a week  Increase losartan to 100 mg daily for blood pressure

## 2021-10-08 ENCOUNTER — Other Ambulatory Visit: Payer: Self-pay | Admitting: Cardiology

## 2021-11-26 ENCOUNTER — Other Ambulatory Visit: Payer: Self-pay | Admitting: Cardiology

## 2021-12-31 ENCOUNTER — Other Ambulatory Visit: Payer: Self-pay | Admitting: Cardiology

## 2022-02-18 ENCOUNTER — Other Ambulatory Visit: Payer: Self-pay | Admitting: Cardiology

## 2022-03-07 NOTE — Progress Notes (Signed)
Cardiology Office Note    Date:  03/11/2022   ID:  [name], DOB 05-05-1951, MRN [id number]  PCP:  [name], MD  Cardiologist:  Dr. [name]  Chief Complaint  Patient presents with   Coronary Artery Disease    History of Present Illness:  [name] is a 71 y.o. male with PMH of CAD, HTN, HLD (intolerant of statins), sinus bradycardia, history of provoked DVT, PVCs on previous 48-hour monitor, mild carotid artery disease and prostate cancer.  Previous cardiac catheterization performed in the setting of an STEMI on 09/21/2017 showed patent LAD stent with 50% mid LAD lesion, 60% mid left circumflex lesion, and 60% PDA lesion.  PDA lesion is unchanged from 2012.  Left circumflex lesion was significant by FFR and he underwent PCI with DES.  Later that night he had recurrent chest pain and was taken back to the Cath Lab.  Cardiac catheterization revealed patent left circumflex stent with dissection into the OM 3.  Medical therapy was planned as any reattempt with crossing with a wire could lead to worsening dissection.  Due to persistent symptoms, he underwent repeat cardiac cath on 01/11/2018, that showed widely patent stents, the wire dissection had healed completely, EF was normal at the time.  He underwent repeat cardiac cath on 08/03/2018 due to exertional chest discomfort.  This showed new restenosis in the left circumflex at the proximal stent margin, there was also progression of disease in the PDA.  Both sites were treated with DES.  He was enrolled in [place] 4 trial.  Based on the last note in February 2020, he did not wish to continue in [place] 4 trial due to myalgia after injection.  In 2020 he had recurrent chest pain.  He underwent repeat cardiac cath on 06/05/19 showing a new stenosis in the LCx just distal to prior stent. This was treated with repeat DES. Following cath he had persistent chests pain and was actually readmitted. Ecg was nonacute. Troponin was not c/w ACS. He did  undergo repeat cath showing patent stents. There was moderate mid LAD disease that was chronic and unchanged from prior studies. He did have an abnormal Flow wire assessment so this was stented as well. DC home on 06/08/19. Amlodipine dose increased for BP.  We later increased losartan to 50 mg daily for BP. He was seen by Pharm D for lipid therapy. Multiple options reviewed. He wanted to rechallenge with low dose Crestor with possible addition of Zetia if not at goal.   We did try him on Zetia. He states he took it for one week and had leg pain so bad he couldn't walk. States he developed aching all over. Stopped Crestor and it improved but is still present.  He was taking lipitor but myalgias came back and he stopped it a few months ago. Reports lab work with Dr Karie Kirks. He states his chest pain is the best it has been in 2 years. Is physically active and just split a bunch of firewood last week. Notes BP fluctuates daily from 100/50 to 150/80.     Past Medical History:  Diagnosis Date   Abnormality of aortic valve    a. possibly bicuspid.   Asthma    Childhood   Carotid artery disease ([place])    a. 1-39% bilaterally in 2017.   Coronary artery disease    a. anterior STEMI 04/2011, s/p DES to LAD.   DVT (deep venous thrombosis) ([facility])    a. tx with  Xarelto x 5 months, stopped due to hematuria.   Frequent PVCs    a. seen on Holter monitor.   Hyperlipidemia, mixed    Hypertension    Myocardial infarction ([place]) 05/21/2011   anterior wall, treated by Dr. [name] with 3.0x34m Promus drug eluting stent to proximal LAD    [place]ipheral [place]    moderate left ICA disease, mild right ICA disease   Prostate cancer ([facility] 2007   Sinus bradycardia    a. HR 50s at home.   Statin intolerance    Unstable angina ([facility] 08/03/2018    Past Surgical History:  Procedure Laterality Date   CARDIAC CATHETERIZATION  05/23/2011, 05/21/2011   05/21/2011   3.0 x 272mPromus Element drug  eluting stent positioned in proximal LAD, stenosis taken from 99% to 0%.   CORONARY STENT INTERVENTION N/A 09/21/2017   Procedure: CORONARY STENT INTERVENTION;  Surgeon: BeLorretta HarpMD;  [facility];  Service: Cardiovascular;  Laterality: N/A;   CORONARY STENT INTERVENTION N/A 08/03/2018   Procedure: CORONARY STENT INTERVENTION;  Surgeon: JoMartiniquePeter M, MD;  [facility];  Service: Cardiovascular;  Laterality: N/A;   CORONARY STENT INTERVENTION N/A 06/05/2019   Procedure: CORONARY STENT INTERVENTION;  Surgeon: JoMartiniquePeter M, MD;  [facility];  Service: Cardiovascular;  Laterality: N/A;   CORONARY STENT INTERVENTION N/A 06/07/2019   Procedure: CORONARY STENT INTERVENTION;  Surgeon: ArWellington HampshireMD;  [facility];  Service: Cardiovascular;  Laterality: N/A;   CORONARY/GRAFT ACUTE MI REVASCULARIZATION N/A 09/21/2017   Procedure: Coronary/Graft Acute MI Revascularization;  Surgeon: JoMartiniquePeter M, MD;  [facility];  Service: Cardiovascular;  Laterality: N/A;   HERNIA REPAIR     INTRAVASCULAR PRESSURE WIRE/FFR STUDY N/A 09/21/2017   Procedure: INTRAVASCULAR PRESSURE WIRE/FFR STUDY;  Surgeon: BeLorretta HarpMD;  [facility];  Service: Cardiovascular;  Laterality: N/A;   INTRAVASCULAR PRESSURE WIRE/FFR STUDY N/A 06/07/2019   Procedure: INTRAVASCULAR PRESSURE WIRE/FFR STUDY;  Surgeon: ArWellington HampshireMD;  [facility];  Service: Cardiovascular;  Laterality: N/A;   LEFT HEART CATH AND CORONARY ANGIOGRAPHY N/A 09/21/2017   Procedure: LEFT HEART CATH AND CORONARY ANGIOGRAPHY;  Surgeon: JoMartiniquePeter M, MD;  [facility];  Service: Cardiovascular;  Laterality: N/A;   LEFT HEART CATH AND CORONARY ANGIOGRAPHY N/A 09/21/2017   Procedure: LEFT HEART CATH AND CORONARY ANGIOGRAPHY;  Surgeon: BeLorretta HarpMD;  [facility];  Service:  Cardiovascular;  Laterality: N/A;   LEFT HEART CATH AND CORONARY ANGIOGRAPHY N/A 01/11/2018   Procedure: LEFT HEART CATH AND CORONARY ANGIOGRAPHY;  Surgeon: JoMartiniquePeter M, MD;  [facility];  Service: Cardiovascular;  Laterality: N/A;   LEFT HEART CATH AND CORONARY ANGIOGRAPHY N/A 08/03/2018   Procedure: LEFT HEART CATH AND CORONARY ANGIOGRAPHY;  Surgeon: JoMartiniquePeter M, MD;  [facility];  Service: Cardiovascular;  Laterality: N/A;   LEFT HEART CATH AND CORONARY ANGIOGRAPHY N/A 06/05/2019   Procedure: LEFT HEART CATH AND CORONARY ANGIOGRAPHY;  Surgeon: JoMartiniquePeter M, MD;  [facility];  Service: Cardiovascular;  Laterality: N/A;   LEFT HEART CATH AND CORONARY ANGIOGRAPHY N/A 06/07/2019   Procedure: LEFT HEART CATH AND CORONARY ANGIOGRAPHY;  Surgeon: ArWellington HampshireMD;  [facility];  Service: Cardiovascular;  Laterality: N/A;   PROSTATECTOMY  2007  Current Medications: Outpatient Medications Prior to Visit  Medication Sig Dispense Refill   amLODipine (NORVASC) 5 MG tablet TAKE (1) TABLET BY MOUTH ONCE DAILY. 90 tablet 1   aspirin 81 MG tablet Take 81 mg by mouth daily.     atorvastatin (LIPITOR) 10 MG tablet Take 1 tablet (10 mg total) by mouth 3 (three) times a week. 30 tablet 3   clopidogrel (PLAVIX) 75 MG tablet TAKE 1 TABLET BY MOUTH ONCE A DAY. 90 tablet 0   Coenzyme Q10 (CO Q-10) 100 MG CAPS Take 100 mg by mouth daily.      losartan (COZAAR) 100 MG tablet Take 1 tablet (100 mg total) by mouth daily. 90 tablet 3   metoprolol tartrate (LOPRESSOR) 25 MG tablet TAKE (1) TABLET BY MOUTH TWICE DAILY. 180 tablet 0   MODERNA COVID-19 BIVAL BOOSTER 50 MCG/0.5ML injection      Multiple Vitamin (MULTIVITAMIN WITH MINERALS) TABS tablet Take 1 tablet by mouth daily.     nitroGLYCERIN (NITROSTAT) 0.4 MG SL tablet PLACE 1 UNDER TONGUE EVERY 5 MINUTES UP TO 3 DOSES AS NEEDED FOR CHEST PAIN. 25 tablet 0   No facility-administered  medications prior to visit.     Allergies:   Adhesive [tape], Demerol [meperidine], Fentanyl, and Statins   Social History   Socioeconomic History   Marital status: Married    Spouse name: Not on file   Number of children: 0   Years of education: Not on file   Highest education level: Not on file  Occupational History   Not on file  Tobacco Use   Smoking status: Former    Packs/day: 1.00    Years: 7.00    Total pack years: 7.00    Types: Cigarettes    Start date: 03/05/1970    Quit date: 09/26/1976    Years since quitting: 45.4   Smokeless tobacco: Never  Vaping Use   Vaping Use: Never used  Substance and Sexual Activity   Alcohol use: No    Alcohol/week: 0.0 standard drinks of alcohol   Drug use: No   Sexual activity: Never  Other Topics Concern   Not on file  Social History Narrative   Not on file   Social Determinants of Health   Financial Resource Strain: Not on file  Food Insecurity: Not on file  Transportation Needs: Not on file  Physical Activity: Not on file  Stress: Not on file  Social Connections: Not on file     Family History:  The patient's family history includes Heart failure in his father; Leukemia in his mother.   ROS:   Please see the history of present illness.    ROS All other systems reviewed and are negative.   PHYSICAL EXAM:   VS:  BP (!) 142/80   Pulse (!) 56   Ht '5\' 7"'$  (1.702 m)   Wt 182 lb (82.6 kg)   SpO2 98%   BMI 28.51 kg/m    GEN: Well nourished, well developed, in no acute distress  HEENT: normal  Neck: no JVD, carotid bruits, or masses Cardiac: RRR; no murmurs, rubs, or gallops,no edema  Respiratory:  clear to auscultation bilaterally, normal work of breathing GI: soft, nontender, nondistended, + BS MS: no deformity or atrophy Skin: warm and dry, no rash Neuro:  Alert and Oriented x 3, Strength and sensation are intact Psych: euthymic mood, full affect  Wt Readings from Last 3 Encounters:  03/11/22 182 lb (82.6 kg)   09/10/21 180 lb 3.2  oz (81.7 kg)  12/21/20 183 lb 6.4 oz (83.2 kg)      Studies/Labs Reviewed:   EKG:  EKG is  ordered today. Sinus brady rate 56. Otherwise normal. I have personally reviewed and interpreted this study.    Recent Labs: No results found for requested labs within last 365 days.   Lipid Panel    Component Value Date/Time   CHOL 126 07/24/2020 0903   CHOL 152 03/22/2013 0813   TRIG 77 07/24/2020 0903   TRIG 83 03/22/2013 0813   HDL 40 07/24/2020 0903   HDL 39 (L) 03/22/2013 0813   CHOLHDL 3.2 07/24/2020 0903   CHOLHDL 5.5 12/28/2017 1027   VLDL 43 (H) 12/28/2017 1027   LDLCALC 71 07/24/2020 0903   LDLCALC 96 03/22/2013 0813    Additional studies/ records that were reviewed today include:   Cath 08/03/2018 Prox LAD lesion is 45% stenosed. Ost RPDA to RPDA lesion is 80% stenosed. A drug-eluting stent was successfully placed using a STENT SYNERGY DES 2.25X12. Post intervention, there is a 0% residual stenosis. Ost LAD to Prox LAD lesion is 35% stenosed. Prox Cx-1 lesion is 95% stenosed. Post intervention, there is a 0% residual stenosis. A drug-eluting stent was successfully placed using a STENT SYNERGY DES 3X12. Non-stenotic Prox Cx-2 lesion was previously treated. Post Atrio lesion is 30% stenosed. LV end diastolic pressure is normal.   1. 2 vessel obstructive CAD    - patent stent in the proximal LAD. Diffuse 45-50% mid LAD    - 95% stenosis in the LCx at the proximal stent margin    - 80% PDA 2. Normal LVEDP 3. Successful PCI of the LCx with DES x 1 overlapping the prior stent 4. Successful PCI of the PDA with DES x 1.    Plan: Anticipate same day discharge.    Recommend uninterrupted dual antiplatelet therapy with Aspirin '81mg'$  daily and Clopidogrel 75 mg daily for a minimum of 12 months (ACS - Class I recommendation).  Cath 06/05/19:  CORONARY STENT INTERVENTION  LEFT HEART CATH AND CORONARY ANGIOGRAPHY  Conclusion    Prox LAD lesion is 45%  stenosed. Ost LAD to Prox LAD lesion is 40% stenosed. Non-stenotic Prox Cx-2 lesion was previously treated. Previously placed Prox Cx-1 drug eluting stent is widely patent. Prox RCA lesion is 25% stenosed. Previously placed Ost RPDA to RPDA drug eluting stent is widely patent. RPAV lesion is 30% stenosed. Mid Cx lesion is 90% stenosed. A drug-eluting stent was successfully placed using a STENT RESOLUTE ONYX 2.5X15. Post intervention, there is a 0% residual stenosis. The left ventricular systolic function is normal. LV end diastolic pressure is normal. The left ventricular ejection fraction is 55-65% by visual estimate.   1. Single vessel obstructive CAD. 90% stenosis in the mid LCx distal to prior stents into the OM1 2. Continued stent patency in the LAD, PDA and proximal to mid LCx 3. Normal LV function 4. Normal LVEDP 5. Successful PCI of the mid LCx/OM1 with DES x 1 in overlapping fashion   Plan: DAPT indefinitely. He is a candidate for same day discharge. Needs referral to lipid clinic to address his hypercholesterolemia and intolerance to statins. With aggressive CAD target LDL <50.      Cardiac cath 06/07/19:  CORONARY STENT INTERVENTION  INTRAVASCULAR PRESSURE WIRE/FFR STUDY  LEFT HEART CATH AND CORONARY ANGIOGRAPHY  Conclusion    Ost LAD to Prox LAD lesion is 40% stenosed. Non-stenotic Prox Cx-1 lesion was previously treated. Previously placed Prox Cx-2  drug eluting stent is widely patent. Balloon angioplasty was performed. Previously placed Mid Cx drug eluting stent is widely patent. Balloon angioplasty was performed. Non-stenotic Ost RPDA to RPDA lesion was previously treated. RPAV lesion is 30% stenosed. Prox RCA lesion is 30% stenosed. Prox LAD to Mid LAD lesion is 70% stenosed. Post intervention, there is a 0% residual stenosis. A drug-eluting stent was successfully placed using a STENT RESOLUTE ONYX 3.0X30. The left ventricular systolic function is normal. LV  end diastolic pressure is normal. The left ventricular ejection fraction is 55-65% by visual estimate.   1.  Widely patent left circumflex and RCA stents.  There is a stent in the proximal LAD which is patent with moderate in-stent restenosis in the proximal segment which was not significant by DFR (0.94).  In addition, the LAD has 70% stenosis in the midsegment distal to the previously placed stent.  This was significant by DFR (0.85) and thus this was treated with PCI and drug-eluting stent placement.  The stent overlapped with the previously placed proximal stent. 2.  Normal LV systolic function and left ventricular end-diastolic pressure.   Recommendations: Dual antiplatelet therapy for at least 6 months and preferably long-term given multiple overlapped stents. Recommend referral to the lipid clinic for treatment of hyperlipidemia.       ASSESSMENT:    1. Essential hypertension   2. Mixed hyperlipidemia   3. CAD S/P percutaneous coronary angioplasty      PLAN:  In order of problems listed above:  CAD s/p multiple prior stent procedures. S/p stent of LCx for restenosis in September 2020. He had stenting of the mid LAD for abnormal FFR 2 weeks later for continued chest pain. Denies any current chest pain. Continue aspirin and Plavix indefinitely.   2.   Hypertension: Blood pressure is fluctuating. Overall acceptable. Continue current doses of amlodipine, Toprol and losartan.   3.   Hyperlipidemia: With multiple prior stent procedures and aggressive CAD he needs aggressive lipid lowering therapy. Seen in lipid clinic. Intolerant of Crestor, Zetia, lipitor, and Inclisaran ([place] trial injection). Will get a copy of most recent labs. He states he is going to try lipitor again. I would be in favor of a PCSK 9 inhibitor but he wishes to defer for now.     Medication Adjustments/Labs and Tests Ordered: Current medicines are reviewed at length with the patient today.  Concerns regarding  medicines are outlined above.  Medication changes, Labs and Tests ordered today are listed in the Patient Instructions below. There are no Patient Instructions on file for this visit.    Signed, Kyle Rodenberg [name], MD  03/11/2022 8:39 AM    [facility] [address], [place], [place]  [address] Phone: [phone]; Fax: [phone]

## 2022-03-11 ENCOUNTER — Encounter: Payer: Self-pay | Admitting: Cardiology

## 2022-03-11 ENCOUNTER — Ambulatory Visit: Payer: PPO | Admitting: Cardiology

## 2022-03-11 VITALS — BP 142/80 | HR 56 | Ht 67.0 in | Wt 182.0 lb

## 2022-03-11 DIAGNOSIS — E782 Mixed hyperlipidemia: Secondary | ICD-10-CM | POA: Diagnosis not present

## 2022-03-11 DIAGNOSIS — I10 Essential (primary) hypertension: Secondary | ICD-10-CM | POA: Diagnosis not present

## 2022-03-11 DIAGNOSIS — I2510 Atherosclerotic heart disease of native coronary artery without angina pectoris: Secondary | ICD-10-CM

## 2022-03-11 DIAGNOSIS — Z9861 Coronary angioplasty status: Secondary | ICD-10-CM | POA: Diagnosis not present

## 2022-03-11 NOTE — Patient Instructions (Signed)

## 2022-04-01 ENCOUNTER — Other Ambulatory Visit: Payer: Self-pay | Admitting: Cardiology

## 2022-04-22 ENCOUNTER — Other Ambulatory Visit: Payer: Self-pay | Admitting: Cardiology

## 2022-07-01 ENCOUNTER — Other Ambulatory Visit: Payer: Self-pay | Admitting: Cardiology

## 2022-07-15 ENCOUNTER — Other Ambulatory Visit: Payer: Self-pay | Admitting: Cardiology

## 2022-08-12 ENCOUNTER — Other Ambulatory Visit: Payer: Self-pay | Admitting: Cardiology
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
## 2022-08-26 ENCOUNTER — Encounter: Payer: Self-pay | Admitting: Cardiology

## 2022-08-26 ENCOUNTER — Ambulatory Visit: Payer: PPO | Attending: Cardiology | Admitting: Cardiology

## 2022-08-26 VITALS — BP 154/84 | HR 53 | Ht 67.0 in | Wt 183.2 lb

## 2022-08-26 DIAGNOSIS — I10 Essential (primary) hypertension: Secondary | ICD-10-CM

## 2022-08-26 DIAGNOSIS — E782 Mixed hyperlipidemia: Secondary | ICD-10-CM

## 2022-08-26 DIAGNOSIS — Z9861 Coronary angioplasty status: Secondary | ICD-10-CM | POA: Diagnosis not present

## 2022-08-26 DIAGNOSIS — I2510 Atherosclerotic heart disease of native coronary artery without angina pectoris: Secondary | ICD-10-CM

## 2022-08-26 MED ORDER — AMLODIPINE BESYLATE 5 MG PO TABS: 5.0000 mg | ORAL_TABLET | Freq: Every day | ORAL | 3 refills | Status: DC

## 2022-08-26 MED ORDER — METOPROLOL TARTRATE 25 MG PO TABS: 25.0000 mg | ORAL_TABLET | Freq: Two times a day (BID) | ORAL | 3 refills | Status: DC

## 2022-10-14 ENCOUNTER — Other Ambulatory Visit: Payer: Self-pay | Admitting: Physician Assistant

## 2022-10-14 ENCOUNTER — Other Ambulatory Visit: Payer: Self-pay | Admitting: Cardiology

## 2022-11-10 ENCOUNTER — Other Ambulatory Visit: Payer: Self-pay | Admitting: Cardiology

## 2022-11-11 ENCOUNTER — Encounter: Payer: Self-pay | Admitting: Cardiology
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
## 2022-11-14 MED ORDER — ATORVASTATIN CALCIUM 10 MG PO TABS: 10.0000 mg | ORAL_TABLET | Freq: Every day | ORAL | 3 refills | Status: DC

## 2023-05-19 ENCOUNTER — Other Ambulatory Visit: Payer: Self-pay | Admitting: Cardiology

## 2023-05-29 NOTE — Progress Notes (Signed)
Cardiology Office Note    Date:  06/05/2023   ID:  [name], DOB [birth year], MRN [id number]  PCP:  Gareth Morgan, MD  Cardiologist:  Dr. [place]  Chief Complaint  Patient presents with   Coronary Artery Disease    History of Present Illness:  [name] is a 72 y.o. male with PMH of CAD, HTN, HLD (intolerant of statins), sinus bradycardia, history of provoked DVT, PVCs on previous 48-hour monitor, mild carotid artery disease and prostate cancer.  Previous cardiac catheterization performed in the setting of an STEMI on 09/21/2017 showed patent LAD stent with 50% mid LAD lesion, 60% mid left circumflex lesion, and 60% PDA lesion.  PDA lesion is unchanged from 2012.  Left circumflex lesion was significant by FFR and he underwent PCI with DES.  Later that night he had recurrent chest pain and was taken back to the Cath Lab.  Cardiac catheterization revealed patent left circumflex stent with dissection into the OM 3.  Medical therapy was planned as any reattempt with crossing with a wire could lead to worsening dissection.  Due to persistent symptoms, he underwent repeat cardiac cath on 01/11/2018, that showed widely patent stents, the wire dissection had healed completely, EF was normal at the time.  He underwent repeat cardiac cath on 08/03/2018 due to exertional chest discomfort.  This showed new restenosis in the left circumflex at the proximal stent margin, there was also progression of disease in the PDA.  Both sites were treated with DES.  He was enrolled in Quail 4 trial.  Based on the last note in February 2020, he did not wish to continue in Burns 4 trial due to myalgia after injection.  In 2020 he had recurrent chest pain.  He underwent repeat cardiac cath on 06/05/19 showing a new stenosis in the LCx just distal to prior stent. This was treated with repeat DES. Following cath he had persistent chests pain and was actually readmitted. Ecg was nonacute. Troponin was not c/w ACS. He did  undergo repeat cath showing patent stents. There was moderate mid LAD disease that was chronic and unchanged from prior studies. He did have an abnormal Flow wire assessment so this was stented as well. DC home on 06/08/19. Amlodipine dose increased for BP.  We later increased losartan to 50 mg daily for BP. He was seen by Pharm D for lipid therapy. Multiple options reviewed. He wanted to rechallenge with low dose Crestor with possible addition of Zetia if not at goal.   We did try him on Zetia. He states he took it for one week and had leg pain so bad he couldn't walk. States he developed aching all over. Stopped Crestor and it improved but is still present.    He has been taking lipitor 3 days a week and seems to tolerate this well. He brings a record of his BP and his readings at home have been excellent with pulse in the upper 50s. He states he did have bad fatigue and dizziness this summer that has improved. No significant chest pain.     Past Medical History:  Diagnosis Date   Abnormality of aortic valve    a. possibly bicuspid.   Asthma    Childhood   Carotid artery disease (HCC)    a. 1-39% bilaterally in 2017.   Coronary artery disease    a. anterior STEMI 04/2011, s/p DES to LAD.   DVT (deep venous thrombosis) (HCC)    a. tx with  Xarelto x 5 months, stopped due to hematuria.   Frequent PVCs    a. seen on Holter monitor.   Hyperlipidemia, mixed    Hypertension    Myocardial infarction (HCC) 05/21/2011   anterior wall, treated by Dr. Marcella Dubs with 3.0x61mm Promus drug eluting stent to proximal LAD    Peripheral arterial disease (HCC)    moderate left ICA disease, mild right ICA disease   Prostate cancer (HCC) 2007   Sinus bradycardia    a. HR 50s at home.   Statin intolerance    Unstable angina (HCC) 08/03/2018    Past Surgical History:  Procedure Laterality Date   CARDIAC CATHETERIZATION  05/23/2011, 05/21/2011   05/21/2011   3.0 x 24mm Promus Element drug eluting  stent positioned in proximal LAD, stenosis taken from 99% to 0%.   CORONARY PRESSURE/FFR STUDY N/A 09/21/2017   Procedure: INTRAVASCULAR PRESSURE WIRE/FFR STUDY;  Surgeon: Runell Gess, MD;  Location: [facility] INVASIVE CV LAB;  Service: Cardiovascular;  Laterality: N/A;   CORONARY PRESSURE/FFR STUDY N/A 06/07/2019   Procedure: INTRAVASCULAR PRESSURE WIRE/FFR STUDY;  Surgeon: Iran Ouch, MD;  Location: [facility] INVASIVE CV LAB;  Service: Cardiovascular;  Laterality: N/A;   CORONARY STENT INTERVENTION N/A 09/21/2017   Procedure: CORONARY STENT INTERVENTION;  Surgeon: Runell Gess, MD;  Location: [facility];  Service: Cardiovascular;  Laterality: N/A;   CORONARY STENT INTERVENTION N/A 08/03/2018   Procedure: CORONARY STENT INTERVENTION;  Surgeon: [place], Kullen Tomasetti M, MD;  Location: [facility] INVASIVE CV LAB;  Service: Cardiovascular;  Laterality: N/A;   CORONARY STENT INTERVENTION N/A 06/05/2019   Procedure: CORONARY STENT INTERVENTION;  Surgeon: [place], Verbena Boeding M, MD;  Location: [facility] INVASIVE CV LAB;  Service: Cardiovascular;  Laterality: N/A;   CORONARY STENT INTERVENTION N/A 06/07/2019   Procedure: CORONARY STENT INTERVENTION;  Surgeon: Iran Ouch, MD;  Location: [facility]AS[facility] CV LAB;  Service: Cardiovascular;  Laterality: N/A;   CORONARY/GRAFT ACUTE MI REVASCULARIZATION N/A 09/21/2017   Procedure: Coronary/Graft Acute MI Revascularization;  Surgeon: [place], Lynesha Bango M, MD;  Location: [facility] INVASIVE CV LAB;  Service: Cardiovascular;  Laterality: N/A;   HERNIA REPAIR     LEFT HEART CATH AND CORONARY ANGIOGRAPHY N/A 09/21/2017   Procedure: LEFT HEART CATH AND CORONARY ANGIOGRAPHY;  Surgeon: [place], Joanthony Hamza M, MD;  Location: [facility] INVASIVE CV LAB;  Service: Cardiovascular;  Laterality: N/A;   LEFT HEART CATH AND CORONARY ANGIOGRAPHY N/A 09/21/2017   Procedure: LEFT HEART CATH AND CORONARY ANGIOGRAPHY;  Surgeon: Runell Gess, MD;  Location: [facility] CV LAB;  Service: Cardiovascular;  Laterality: N/A;    LEFT HEART CATH AND CORONARY ANGIOGRAPHY N/A 01/11/2018   Procedure: LEFT HEART CATH AND CORONARY ANGIOGRAPHY;  Surgeon: [place], Cia Garretson M, MD;  Location: [facility] INVASIVE CV LAB;  Service: Cardiovascular;  Laterality: N/A;   LEFT HEART CATH AND CORONARY ANGIOGRAPHY N/A 08/03/2018   Procedure: LEFT HEART CATH AND CORONARY ANGIOGRAPHY;  Surgeon: [place], Gwenn Teodoro M, MD;  Location: [facility] INVASIVE CV LAB;  Service: Cardiovascular;  Laterality: N/A;   LEFT HEART CATH AND CORONARY ANGIOGRAPHY N/A 06/05/2019   Procedure: LEFT HEART CATH AND CORONARY ANGIOGRAPHY;  Surgeon: [place], Slayden Mennenga M, MD;  Location: [facility] INVASIVE CV LAB;  Service: Cardiovascular;  Laterality: N/A;   LEFT HEART CATH AND CORONARY ANGIOGRAPHY N/A 06/07/2019   Procedure: LEFT HEART CATH AND CORONARY ANGIOGRAPHY;  Surgeon: Iran Ouch, MD;  Location: [facility] INVASIVE CV LAB;  Service: Cardiovascular;  Laterality: N/A;   PROSTATECTOMY  2007    Current  Medications: Outpatient Medications Prior to Visit  Medication Sig Dispense Refill   amLODipine (NORVASC) 5 MG tablet Take 1 tablet (5 mg total) by mouth daily. Please keep scheduled appointment 90 tablet 3   aspirin 81 MG tablet Take 81 mg by mouth daily.     atorvastatin (LIPITOR) 10 MG tablet Take 1 tablet (10 mg total) by mouth daily. 90 tablet 3   clopidogrel (PLAVIX) 75 MG tablet Take 1 tablet (75 mg total) by mouth daily. 90 tablet 1   Coenzyme Q10 (CO Q-10) 100 MG CAPS Take 100 mg by mouth daily.      losartan (COZAAR) 100 MG tablet TAKE 1 TABLET BY MOUTH ONCE DAILY. 90 tablet 3   MODERNA COVID-19 BIVAL BOOSTER 50 MCG/0.5ML injection      Multiple Vitamin (MULTIVITAMIN WITH MINERALS) TABS tablet Take 1 tablet by mouth daily.     nitroGLYCERIN (NITROSTAT) 0.4 MG SL tablet PLACE 1 UNDER TONGUE EVERY 5 MINUTES UP TO 3 DOSES AS NEEDED FOR CHEST PAIN. 25 tablet 3   metoprolol tartrate (LOPRESSOR) 25 MG tablet Take 1 tablet (25 mg total) by mouth 2 (two) times daily. 180 tablet 3   No  facility-administered medications prior to visit.     Allergies:   Adhesive [tape], Demerol [meperidine], Fentanyl, and Statins   Social History   Socioeconomic History   Marital status: Married    Spouse name: Not on file   Number of children: 0   Years of education: Not on file   Highest education level: Not on file  Occupational History   Not on file  Tobacco Use   Smoking status: Former    Current packs/day: 0.00    Average packs/day: 1 pack/day for 7.0 years (7.0 ttl pk-yrs)    Types: Cigarettes    Start date: 03/05/1970    Quit date: 09/26/1976    Years since quitting: 46.7   Smokeless tobacco: Never  Vaping Use   Vaping status: Never Used  Substance and Sexual Activity   Alcohol use: No    Alcohol/week: 0.0 standard drinks of alcohol   Drug use: No   Sexual activity: Never  Other Topics Concern   Not on file  Social History Narrative   Not on file   Social Determinants of Health   Financial Resource Strain: Not on file  Food Insecurity: Not on file  Transportation Needs: Not on file  Physical Activity: Not on file  Stress: Not on file  Social Connections: Not on file     Family History:  The patient's family history includes Heart failure in his father; Leukemia in his mother.   ROS:   Please see the history of present illness.    ROS All other systems reviewed and are negative.   PHYSICAL EXAM:   VS:  BP (!) 146/83 (BP Location: Right Arm, Patient Position: Sitting, Cuff Size: Normal)   Pulse (!) 51   Ht 5\' 6"  (1.676 m)   Wt 181 lb 12.8 oz (82.5 kg)   SpO2 93%   BMI 29.34 kg/m    GEN: Well nourished, well developed, in no acute distress  HEENT: normal  Neck: no JVD, carotid bruits, or masses Cardiac: RRR; no murmurs, rubs, or gallops,no edema  Respiratory:  clear to auscultation bilaterally, normal work of breathing GI: soft, nontender, nondistended, + BS MS: no deformity or atrophy Skin: warm and dry, no rash Neuro:  Alert and Oriented x 3,  Strength and sensation are intact Psych: euthymic mood, full  affect  Wt Readings from Last 3 Encounters:  06/05/23 181 lb 12.8 oz (82.5 kg)  08/26/22 183 lb 3.2 oz (83.1 kg)  03/11/22 182 lb (82.6 kg)      Studies/Labs Reviewed:   EKG Interpretation Date/Time:  Monday June 05 2023 08:22:43 EDT Ventricular Rate:  51 PR Interval:  152 QRS Duration:  88 QT Interval:  436 QTC Calculation: 401 R Axis:   -34  Text Interpretation: Sinus bradycardia Left axis deviation When compared with ECG of 08-Jun-2019 04:53, No significant change was found Confirmed by [place], Doriann Zuch [phone]) on 06/05/2023 8:26:05 AM      Recent Labs: No results found for requested labs within last 365 days.   Lipid Panel    Component Value Date/Time   CHOL 126 07/24/2020 0903   CHOL 152 03/22/2013 0813   TRIG 77 07/24/2020 0903   TRIG 83 03/22/2013 0813   HDL 40 07/24/2020 0903   HDL 39 (L) 03/22/2013 0813   CHOLHDL 3.2 07/24/2020 0903   CHOLHDL 5.5 12/28/2017 1027   VLDL 43 (H) 12/28/2017 1027   LDLCALC 71 07/24/2020 0903   LDLCALC 96 03/22/2013 0813    Additional studies/ records that were reviewed today include:   Cath 08/03/2018 Prox LAD lesion is 45% stenosed. Ost RPDA to RPDA lesion is 80% stenosed. A drug-eluting stent was successfully placed using a STENT SYNERGY DES 2.25X12. Post intervention, there is a 0% residual stenosis. Ost LAD to Prox LAD lesion is 35% stenosed. Prox Cx-1 lesion is 95% stenosed. Post intervention, there is a 0% residual stenosis. A drug-eluting stent was successfully placed using a STENT SYNERGY DES 3X12. Non-stenotic Prox Cx-2 lesion was previously treated. Post Atrio lesion is 30% stenosed. LV end diastolic pressure is normal.   1. 2 vessel obstructive CAD    - patent stent in the proximal LAD. Diffuse 45-50% mid LAD    - 95% stenosis in the LCx at the proximal stent margin    - 80% PDA 2. Normal LVEDP 3. Successful PCI of the LCx with DES x 1  overlapping the prior stent 4. Successful PCI of the PDA with DES x 1.    Plan: Anticipate same day discharge.    Recommend uninterrupted dual antiplatelet therapy with Aspirin 81mg  daily and Clopidogrel 75 mg daily for a minimum of 12 months (ACS - Class I recommendation).  Cath 06/05/19:  CORONARY STENT INTERVENTION  LEFT HEART CATH AND CORONARY ANGIOGRAPHY  Conclusion    Prox LAD lesion is 45% stenosed. Ost LAD to Prox LAD lesion is 40% stenosed. Non-stenotic Prox Cx-2 lesion was previously treated. Previously placed Prox Cx-1 drug eluting stent is widely patent. Prox RCA lesion is 25% stenosed. Previously placed Ost RPDA to RPDA drug eluting stent is widely patent. RPAV lesion is 30% stenosed. Mid Cx lesion is 90% stenosed. A drug-eluting stent was successfully placed using a STENT RESOLUTE ONYX 2.5X15. Post intervention, there is a 0% residual stenosis. The left ventricular systolic function is normal. LV end diastolic pressure is normal. The left ventricular ejection fraction is 55-65% by visual estimate.   1. Single vessel obstructive CAD. 90% stenosis in the mid LCx distal to prior stents into the OM1 2. Continued stent patency in the LAD, PDA and proximal to mid LCx 3. Normal LV function 4. Normal LVEDP 5. Successful PCI of the mid LCx/OM1 with DES x 1 in overlapping fashion   Plan: DAPT indefinitely. He is a candidate for same day discharge. Needs referral to lipid clinic  to address his hypercholesterolemia and intolerance to statins. With aggressive CAD target LDL <50.      Cardiac cath 06/07/19:  CORONARY STENT INTERVENTION  INTRAVASCULAR PRESSURE WIRE/FFR STUDY  LEFT HEART CATH AND CORONARY ANGIOGRAPHY  Conclusion    Ost LAD to Prox LAD lesion is 40% stenosed. Non-stenotic Prox Cx-1 lesion was previously treated. Previously placed Prox Cx-2 drug eluting stent is widely patent. Balloon angioplasty was performed. Previously placed Mid Cx drug eluting stent is  widely patent. Balloon angioplasty was performed. Non-stenotic Ost RPDA to RPDA lesion was previously treated. RPAV lesion is 30% stenosed. Prox RCA lesion is 30% stenosed. Prox LAD to Mid LAD lesion is 70% stenosed. Post intervention, there is a 0% residual stenosis. A drug-eluting stent was successfully placed using a STENT RESOLUTE ONYX 3.0X30. The left ventricular systolic function is normal. LV end diastolic pressure is normal. The left ventricular ejection fraction is 55-65% by visual estimate.   1.  Widely patent left circumflex and RCA stents.  There is a stent in the proximal LAD which is patent with moderate in-stent restenosis in the proximal segment which was not significant by DFR (0.94).  In addition, the LAD has 70% stenosis in the midsegment distal to the previously placed stent.  This was significant by DFR (0.85) and thus this was treated with PCI and drug-eluting stent placement.  The stent overlapped with the previously placed proximal stent. 2.  Normal LV systolic function and left ventricular end-diastolic pressure.   Recommendations: Dual antiplatelet therapy for at least 6 months and preferably long-term given multiple overlapped stents. Recommend referral to the lipid clinic for treatment of hyperlipidemia.       ASSESSMENT:    1. Essential hypertension   2. CAD S/P percutaneous coronary angioplasty   3. Mixed hyperlipidemia   4. Prostate cancer [facility])       PLAN:  In order of problems listed above:  CAD s/p multiple prior stent procedures. S/p stent of LCx for restenosis in September 2020. He had stenting of the mid LAD for abnormal FFR 2 weeks later for continued chest pain. Denies any current chest pain. Continue aspirin and Plavix indefinitely.   2.   Hypertension: Blood pressure is acceptable based on home readings. Given his symptoms of fatigue will reduce metoprolol dose and switch to succinate formulation- so Toprol XL 25 mg daily.  Continue  current doses of amlodipine, and losartan. Prescriptions refilled.    3.   Hyperlipidemia: With multiple prior stent procedures and aggressive CAD I would like for him to be on aggressive lipid lowering therapy. Seen in lipid clinic in past intolerant of Crestor, Zetia, lipitor, and Inclisaran (Orion trial injection). He is on lipitor 10 mg 3 x/week. Will update labs today. He is somewhat more receptive to the idea of a PCSK 9 inhibitor but he wishes to see how the lipitor does now.   4. Fatigue. Check TSH, CBC, chemistries. Reduce metoprolol as noted.     Medication Adjustments/Labs and Tests Ordered: Current medicines are reviewed at length with the patient today.  Concerns regarding medicines are outlined above.  Medication changes, Labs and Tests ordered today are listed in the Patient Instructions below. There are no Patient Instructions on file for this visit.    Signed, Brekyn Huntoon Swaziland, MD  06/05/2023 8:39 AM    [facility] [address] [place], [place], [place]  [address] Phone: [phone]; Fax: [phone]

## 2023-06-05 ENCOUNTER — Encounter: Payer: Self-pay | Admitting: Cardiology

## 2023-06-05 ENCOUNTER — Ambulatory Visit: Payer: PPO | Attending: Cardiology | Admitting: Cardiology

## 2023-06-05 VITALS — BP 146/83 | HR 51 | Ht 66.0 in | Wt 181.8 lb

## 2023-06-05 DIAGNOSIS — I2510 Atherosclerotic heart disease of native coronary artery without angina pectoris: Secondary | ICD-10-CM | POA: Diagnosis not present

## 2023-06-05 DIAGNOSIS — E782 Mixed hyperlipidemia: Secondary | ICD-10-CM

## 2023-06-05 DIAGNOSIS — C61 Malignant neoplasm of prostate: Secondary | ICD-10-CM | POA: Diagnosis not present

## 2023-06-05 DIAGNOSIS — I10 Essential (primary) hypertension: Secondary | ICD-10-CM

## 2023-06-05 DIAGNOSIS — Z9861 Coronary angioplasty status: Secondary | ICD-10-CM

## 2023-06-05 MED ORDER — METOPROLOL SUCCINATE ER 25 MG PO TB24: 25.0000 mg | ORAL_TABLET | Freq: Every day | ORAL | 1 refills | Status: DC

## 2023-06-05 NOTE — Patient Instructions (Signed)
Medication Instructions:  Stop Metoprolol Tartrate Start Metoprolol Succinate 25 mg daily Continue all other medications  *If you need a refill on your cardiac medications before your next appointment, please call your pharmacy*   Lab Work: Cbc,cmet,lipid panel,tsh,psa today   Testing/Procedures: None ordered   Follow-Up: At [facility], you and your health needs are our priority.  As part of our continuing mission to provide you with exceptional heart care, we have created designated Provider Care Teams.  These Care Teams include your primary Cardiologist (physician) and Advanced Practice Providers (APPs -  Physician Assistants and Nurse Practitioners) who all work together to provide you with the care you need, when you need it.  We recommend signing up for the patient portal called "MyChart".  Sign up information is provided on this After Visit Summary.  MyChart is used to connect with patients for Virtual Visits (Telemedicine).  Patients are able to view lab/test results, encounter notes, upcoming appointments, etc.  Non-urgent messages can be sent to your provider as well.   To learn more about what you can do with MyChart, go to [email or web address].    Your next appointment:  6 months  Call in Jan to schedule March appointment     Provider:  Dr.Jordan

## 2023-06-06 ENCOUNTER — Encounter: Payer: Self-pay | Admitting: Cardiology

## 2023-06-06 LAB — LIPID PANEL
Chol/HDL Ratio: 4.5 ratio (ref 0.0–5.0)
Cholesterol, Total: 159 mg/dL (ref 100–199)
HDL: 35 mg/dL — ABNORMAL LOW (ref >39)
LDL Chol Calc (NIH): 100 mg/dL — ABNORMAL HIGH (ref 0–99)
Triglycerides: 134 mg/dL (ref 0–149)
VLDL Cholesterol Cal: 24 mg/dL (ref 5–40)

## 2023-06-06 LAB — COMPREHENSIVE METABOLIC PANEL
ALT: 19 IU/L (ref 0–44)
AST: 23 IU/L (ref 0–40)
Albumin: 4.4 g/dL (ref 3.8–4.8)
Alkaline Phosphatase: 89 IU/L (ref 44–121)
BUN/Creatinine Ratio: 21 (ref 10–24)
BUN: 22 mg/dL (ref 8–27)
Bilirubin Total: 0.5 mg/dL (ref 0.0–1.2)
CO2: 27 mmol/L (ref 20–29)
Calcium: 9.7 mg/dL (ref 8.6–10.2)
Chloride: 106 mmol/L (ref 96–106)
Creatinine, Ser: 1.05 mg/dL (ref 0.76–1.27)
Globulin, Total: 2 g/dL (ref 1.5–4.5)
Glucose: 98 mg/dL (ref 70–99)
Potassium: 5.5 mmol/L — ABNORMAL HIGH (ref 3.5–5.2)
Sodium: 142 mmol/L (ref 134–144)
Total Protein: 6.4 g/dL (ref 6.0–8.5)
eGFR: 75 mL/min/{1.73_m2} (ref >59)

## 2023-06-06 LAB — CBC WITH DIFFERENTIAL/PLATELET
Basophils Absolute: 0 10*3/uL (ref 0.0–0.2)
Basos: 1 %
EOS (ABSOLUTE): 0.2 10*3/uL (ref 0.0–0.4)
Eos: 4 %
Hematocrit: 45.8 % (ref 37.5–51.0)
Hemoglobin: 15.2 g/dL (ref 13.0–17.7)
Immature Grans (Abs): 0 10*3/uL (ref 0.0–0.1)
Immature Granulocytes: 0 %
Lymphocytes Absolute: 1.5 10*3/uL (ref 0.7–3.1)
Lymphs: 26 %
MCH: 31.1 pg (ref 26.6–33.0)
MCHC: 33.2 g/dL (ref 31.5–35.7)
MCV: 94 fL (ref 79–97)
Monocytes Absolute: 0.7 10*3/uL (ref 0.1–0.9)
Monocytes: 13 %
Neutrophils Absolute: 3.3 10*3/uL (ref 1.4–7.0)
Neutrophils: 56 %
Platelets: 258 10*3/uL (ref 150–450)
RBC: 4.89 x10E6/uL (ref 4.14–5.80)
RDW: 12.5 % (ref 11.6–15.4)
WBC: 5.8 10*3/uL (ref 3.4–10.8)

## 2023-06-06 LAB — PSA: Prostate Specific Ag, Serum: <0.1 ng/mL (ref 0.0–4.0)

## 2023-06-06 LAB — TSH: TSH: 4.29 u[IU]/mL (ref 0.450–4.500)

## 2023-06-07 ENCOUNTER — Other Ambulatory Visit: Payer: Self-pay

## 2023-06-07 DIAGNOSIS — E875 Hyperkalemia: Secondary | ICD-10-CM

## 2023-06-09 LAB — BASIC METABOLIC PANEL
BUN/Creatinine Ratio: 17 (ref 10–24)
BUN: 19 mg/dL (ref 8–27)
CO2: 24 mmol/L (ref 20–29)
Calcium: 9.6 mg/dL (ref 8.6–10.2)
Chloride: 105 mmol/L (ref 96–106)
Creatinine, Ser: 1.14 mg/dL (ref 0.76–1.27)
Glucose: 80 mg/dL (ref 70–99)
Potassium: 5.1 mmol/L (ref 3.5–5.2)
Sodium: 142 mmol/L (ref 134–144)
eGFR: 68 mL/min/{1.73_m2} (ref >59)

## 2023-09-05 ENCOUNTER — Ambulatory Visit: Payer: PPO | Admitting: Internal Medicine

## 2023-09-05 ENCOUNTER — Encounter: Payer: Self-pay | Admitting: Internal Medicine

## 2023-09-05 VITALS — BP 142/70 | HR 64 | Ht 67.0 in | Wt 186.8 lb

## 2023-09-05 DIAGNOSIS — I2510 Atherosclerotic heart disease of native coronary artery without angina pectoris: Secondary | ICD-10-CM

## 2023-09-05 DIAGNOSIS — E782 Mixed hyperlipidemia: Secondary | ICD-10-CM | POA: Diagnosis not present

## 2023-09-05 DIAGNOSIS — Z23 Encounter for immunization: Secondary | ICD-10-CM

## 2023-09-05 DIAGNOSIS — Z9861 Coronary angioplasty status: Secondary | ICD-10-CM

## 2023-09-05 DIAGNOSIS — I10 Essential (primary) hypertension: Secondary | ICD-10-CM

## 2023-09-05 DIAGNOSIS — Z8546 Personal history of malignant neoplasm of prostate: Secondary | ICD-10-CM

## 2023-09-05 NOTE — Assessment & Plan Note (Signed)
Influenza vaccine administered today.

## 2023-09-05 NOTE — Assessment & Plan Note (Signed)
Closely followed by cardiology (Dr. [place]).  He has a history of multiple MIs and multiple stents.  Most recently underwent stenting of the distal LCx in September 2020.  Remains on DAPT (ASA/Plavix) indefinitely.  He is additionally prescribed atorvastatin and Toprol-XL.  Denies recent chest pain.  No medication changes were made today.  Cardiology follow-up is scheduled for March 2025.

## 2023-09-05 NOTE — Progress Notes (Signed)
New Patient Office Visit  Subjective    Patient ID: [name], male    DOB: 10-Apr-1951  Age: 72 y.o. MRN: [id number]  CC:  Chief Complaint  Patient presents with   Establish Care    HPI [name] presents to establish care.  He is a 72 year old male who endorses a past medical history significant for CAD s/p multiple MIs and multiple stents, 2020 most recently, prostate cancer s/p prostatectomy, HTN, and HLD.  Previously followed by Dr. Sudie Bailey and is currently followed by cardiology (Dr. Swaziland).  [name] reports feeling well today.  He is asymptomatic currently and has no acute concerns to discuss aside from desiring to establish care.  He is a retired Surveyor, minerals.  Denies tobacco, alcohol, and illicit drug use.  He endorses a family medical history significant for CAD, CVA, and leukemia.  Chronic medical conditions and outstanding preventative care items discussed today are individually addressed in A/P below.  Outpatient Encounter Medications as of 09/05/2023  Medication Sig   amLODipine (NORVASC) 5 MG tablet Take 1 tablet (5 mg total) by mouth daily. Please keep scheduled appointment   aspirin 81 MG tablet Take 81 mg by mouth daily.   atorvastatin (LIPITOR) 10 MG tablet Take 1 tablet (10 mg total) by mouth daily.   clopidogrel (PLAVIX) 75 MG tablet Take 1 tablet (75 mg total) by mouth daily.   Coenzyme Q10 (CO Q-10) 100 MG CAPS Take 100 mg by mouth daily.    metoprolol succinate (TOPROL XL) 25 MG 24 hr tablet Take 1 tablet (25 mg total) by mouth daily.   Multiple Vitamin (MULTIVITAMIN WITH MINERALS) TABS tablet Take 1 tablet by mouth daily.   nitroGLYCERIN (NITROSTAT) 0.4 MG SL tablet PLACE 1 UNDER TONGUE EVERY 5 MINUTES UP TO 3 DOSES AS NEEDED FOR CHEST PAIN.   losartan (COZAAR) 100 MG tablet TAKE 1 TABLET BY MOUTH ONCE DAILY. (Patient not taking: Reported on 09/05/2023)   [DISCONTINUED] MODERNA COVID-19 BIVAL BOOSTER 50 MCG/0.5ML injection  (Patient not taking: Reported  on 09/05/2023)   No facility-administered encounter medications on file as of 09/05/2023.    Past Medical History:  Diagnosis Date   Abnormality of aortic valve    a. possibly bicuspid.   Asthma    Childhood   Carotid artery disease (HCC)    a. 1-39% bilaterally in 2017.   Coronary artery disease    a. anterior STEMI 04/2011, s/p DES to LAD.   DVT (deep venous thrombosis) (HCC)    a. tx with Xarelto x 5 months, stopped due to hematuria.   Frequent PVCs    a. seen on Holter monitor.   Hyperlipidemia, mixed    Hypertension    Myocardial infarction (HCC) 05/21/2011   anterior wall, treated by Dr. Marcella Dubs with 3.0x58mm Promus drug eluting stent to proximal LAD    Peripheral arterial disease (HCC)    moderate left ICA disease, mild right ICA disease   Prostate cancer (HCC) 2007   Sinus bradycardia    a. HR 50s at home.   Statin intolerance    Unstable angina (HCC) 08/03/2018    Past Surgical History:  Procedure Laterality Date   CARDIAC CATHETERIZATION  05/23/2011, 05/21/2011   05/21/2011   3.0 x 24mm Promus Element drug eluting stent positioned in proximal LAD, stenosis taken from 99% to 0%.   CORONARY PRESSURE/FFR STUDY N/A 09/21/2017   Procedure: INTRAVASCULAR PRESSURE WIRE/FFR STUDY;  Surgeon: Runell Gess, MD;  Location: [facility]VASIVE [facility];  Service: Cardiovascular;  Laterality: N/A;   CORONARY PRESSURE/FFR STUDY N/A 06/07/2019   Procedure: INTRAVASCULAR PRESSURE WIRE/FFR STUDY;  Surgeon: Iran Ouch, MD;  Location: [facility];  Service: Cardiovascular;  Laterality: N/A;   CORONARY STENT INTERVENTION N/A 09/21/2017   Procedure: CORONARY STENT INTERVENTION;  Surgeon: Runell Gess, MD;  Location: [facility];  Service: Cardiovascular;  Laterality: N/A;   CORONARY STENT INTERVENTION N/A 08/03/2018   Procedure: CORONARY STENT INTERVENTION;  Surgeon: Swaziland, Peter M, MD;  Location: [facility] INVASIVE CV LAB;  Service: Cardiovascular;  Laterality: N/A;    CORONARY STENT INTERVENTION N/A 06/05/2019   Procedure: CORONARY STENT INTERVENTION;  Surgeon: Swaziland, Peter M, MD;  Location: [facility] INVASIVE CV LAB;  Service: Cardiovascular;  Laterality: N/A;   CORONARY STENT INTERVENTION N/A 06/07/2019   Procedure: CORONARY STENT INTERVENTION;  Surgeon: Iran Ouch, MD;  Location: [facility];  Service: Cardiovascular;  Laterality: N/A;   CORONARY/GRAFT ACUTE MI REVASCULARIZATION N/A 09/21/2017   Procedure: Coronary/Graft Acute MI Revascularization;  Surgeon: Swaziland, Peter M, MD;  Location: [facility] INVASIVE CV LAB;  Service: Cardiovascular;  Laterality: N/A;   HERNIA REPAIR     LEFT HEART CATH AND CORONARY ANGIOGRAPHY N/A 09/21/2017   Procedure: LEFT HEART CATH AND CORONARY ANGIOGRAPHY;  Surgeon: Swaziland, Peter M, MD;  Location: [facility] INVASIVE CV LAB;  Service: Cardiovascular;  Laterality: N/A;   LEFT HEART CATH AND CORONARY ANGIOGRAPHY N/A 09/21/2017   Procedure: LEFT HEART CATH AND CORONARY ANGIOGRAPHY;  Surgeon: Runell Gess, MD;  Location: [facility];  Service: Cardiovascular;  Laterality: N/A;   LEFT HEART CATH AND CORONARY ANGIOGRAPHY N/A 01/11/2018   Procedure: LEFT HEART CATH AND CORONARY ANGIOGRAPHY;  Surgeon: Swaziland, Peter M, MD;  Location: [facility] INVASIVE CV LAB;  Service: Cardiovascular;  Laterality: N/A;   LEFT HEART CATH AND CORONARY ANGIOGRAPHY N/A 08/03/2018   Procedure: LEFT HEART CATH AND CORONARY ANGIOGRAPHY;  Surgeon: Swaziland, Peter M, MD;  Location: [facility] INVASIVE CV LAB;  Service: Cardiovascular;  Laterality: N/A;   LEFT HEART CATH AND CORONARY ANGIOGRAPHY N/A 06/05/2019   Procedure: LEFT HEART CATH AND CORONARY ANGIOGRAPHY;  Surgeon: Swaziland, Peter M, MD;  Location: [facility] INVASIVE CV LAB;  Service: Cardiovascular;  Laterality: N/A;   LEFT HEART CATH AND CORONARY ANGIOGRAPHY N/A 06/07/2019   Procedure: LEFT HEART CATH AND CORONARY ANGIOGRAPHY;  Surgeon: Iran Ouch, MD;  Location: [facility];  Service: Cardiovascular;   Laterality: N/A;   PROSTATECTOMY  2007    Family History  Problem Relation Age of Onset   Leukemia Mother    Heart failure Father        CABG at age 9    Social History   Socioeconomic History   Marital status: Married    Spouse name: Not on file   Number of children: 0   Years of education: Not on file   Highest education level: Associate degree: occupational, Scientist, product/process development, or vocational program  Occupational History   Not on file  Tobacco Use   Smoking status: Former    Current packs/day: 0.00    Average packs/day: 1 pack/day for 7.0 years (7.0 ttl pk-yrs)    Types: Cigarettes    Start date: 03/05/1970    Quit date: 09/26/1976    Years since quitting: 46.9   Smokeless tobacco: Never  Vaping Use   Vaping status: Never Used  Substance and Sexual Activity   Alcohol use: No    Alcohol/week: 0.0 standard drinks of  alcohol   Drug use: No   Sexual activity: Never  Other Topics Concern   Not on file  Social History Narrative   Not on file   Social Determinants of Health   Financial Resource Strain: Low Risk  (09/04/2023)   Overall Financial Resource Strain (CARDIA)    Difficulty of Paying Living Expenses: Not hard at all  Food Insecurity: No Food Insecurity (09/04/2023)   Hunger Vital Sign    Worried About Running Out of Food in the Last Year: Never true    Ran Out of Food in the Last Year: Never true  Transportation Needs: No Transportation Needs (09/04/2023)   PRAPARE - Administrator, Civil Service (Medical): No    Lack of Transportation (Non-Medical): No  Physical Activity: Sufficiently Active (09/04/2023)   Exercise Vital Sign    Days of Exercise per Week: 5 days    Minutes of Exercise per Session: 40 min  Stress: No Stress Concern Present (09/04/2023)   [employer] of Occupational Health - Occupational Stress Questionnaire    Feeling of Stress : Only a little  Social Connections: Moderately Isolated (09/04/2023)   Social Connection and Isolation  Panel [NHANES]    Frequency of Communication with Friends and Family: More than three times a week    Frequency of Social Gatherings with Friends and Family: Once a week    Attends Religious Services: Never    Database administrator or Organizations: No    Attends Engineer, structural: Not on file    Marital Status: Married  Catering manager Violence: Not on file   Review of Systems  Constitutional:  Negative for chills and fever.  HENT:  Negative for sore throat.   Respiratory:  Negative for cough and shortness of breath.   Cardiovascular:  Negative for chest pain, palpitations and leg swelling.  Gastrointestinal:  Negative for abdominal pain, blood in stool, constipation, diarrhea, nausea and vomiting.  Genitourinary:  Negative for dysuria and hematuria.  Musculoskeletal:  Negative for myalgias.  Skin:  Negative for itching and rash.  Neurological:  Negative for dizziness and headaches.  Psychiatric/Behavioral:  Negative for depression and suicidal ideas.    Objective    BP (!) 142/70   Pulse 64   Ht 5\' 7"  (1.702 m)   Wt 186 lb 12.8 oz (84.7 kg)   SpO2 94%   BMI 29.26 kg/m   Physical Exam Vitals reviewed.  Constitutional:      General: He is not in acute distress.    Appearance: Normal appearance. He is not ill-appearing.  HENT:     Head: Normocephalic and atraumatic.     Right Ear: External ear normal.     Left Ear: External ear normal.     Nose: Nose normal. No congestion or rhinorrhea.     Mouth/Throat:     Mouth: Mucous membranes are moist.     Pharynx: Oropharynx is clear.  Eyes:     General: No scleral icterus.    Extraocular Movements: Extraocular movements intact.     Conjunctiva/sclera: Conjunctivae normal.     Pupils: Pupils are equal, round, and reactive to light.  Cardiovascular:     Rate and Rhythm: Normal rate and regular rhythm.     Pulses: Normal pulses.     Heart sounds: Normal heart sounds. No murmur heard. Pulmonary:     Effort:  Pulmonary effort is normal.     Breath sounds: Normal breath sounds. No wheezing, rhonchi or rales.  Abdominal:     General: Abdomen is flat. Bowel sounds are normal. There is no distension.     Palpations: Abdomen is soft.     Tenderness: There is no abdominal tenderness.  Musculoskeletal:        General: No swelling or deformity. Normal range of motion.     Cervical back: Normal range of motion.  Skin:    General: Skin is warm and dry.     Capillary Refill: Capillary refill takes less than 2 seconds.  Neurological:     General: No focal deficit present.     Mental Status: He is alert and oriented to person, place, and time.     Motor: No weakness.  Psychiatric:        Mood and Affect: Mood normal.        Behavior: Behavior normal.        Thought Content: Thought content normal.   Last CBC Lab Results  Component Value Date   WBC 5.8 06/05/2023   HGB 15.2 06/05/2023   HCT 45.8 06/05/2023   MCV 94 06/05/2023   MCH 31.1 06/05/2023   RDW 12.5 06/05/2023   PLT 258 06/05/2023   Last metabolic panel Lab Results  Component Value Date   GLUCOSE 80 06/08/2023   NA 142 06/08/2023   K 5.1 06/08/2023   CL 105 06/08/2023   CO2 24 06/08/2023   BUN 19 06/08/2023   CREATININE 1.14 06/08/2023   EGFR 68 06/08/2023   CALCIUM 9.6 06/08/2023   PROT 6.4 06/05/2023   ALBUMIN 4.4 06/05/2023   LABGLOB 2.0 06/05/2023   AGRATIO 2.6 (H) 05/30/2019   BILITOT 0.5 06/05/2023   ALKPHOS 89 06/05/2023   AST 23 06/05/2023   ALT 19 06/05/2023   ANIONGAP 8 06/08/2019   Last lipids Lab Results  Component Value Date   CHOL 159 06/05/2023   HDL 35 (L) 06/05/2023   LDLCALC 100 (H) 06/05/2023   TRIG 134 06/05/2023   CHOLHDL 4.5 06/05/2023   Last hemoglobin A1c Lab Results  Component Value Date   HGBA1C 5.4 05/30/2019   Last thyroid functions Lab Results  Component Value Date   TSH 4.290 06/05/2023   Assessment & Plan:   Problem List Items Addressed This Visit       HTN  (hypertension) (Chronic)    He is currently prescribed amlodipine 5 mg daily, losartan 100 mg daily, and metoprolol succinate 25 mg daily.  BP is mildly elevated today at 142/70.  Mr. Shimoda reports fluctuating BP readings but states that home readings are typically lower.  No medication changes were made today.      CAD S/P percutaneous coronary angioplasty    Closely followed by cardiology (Dr. Swaziland).  He has a history of multiple MIs and multiple stents.  Most recently underwent stenting of the distal LCx in September 2020.  Remains on DAPT (ASA/Plavix) indefinitely.  He is additionally prescribed atorvastatin and Toprol-XL.  Denies recent chest pain.  No medication changes were made today.  Cardiology follow-up is scheduled for March 2025.      Mixed hyperlipidemia (Chronic)    Lipid panel updated in September.  Total cholesterol 159 and LDL 100. He is currently prescribed atorvastatin 10 mg 3 times weekly.  Previously documented intolerance to rosuvastatin, Zetia, higher doses of atorvastatin, and Inclisaran.  Discussing PCSK9 inhibitor with cardiology.      History of prostate cancer    S/p prostatectomy (2007).  Not actively followed by urology or oncology.  Denies  recent urinary symptoms.      Need for influenza vaccination    Influenza vaccine administered today      Return in about 1 year (around 09/04/2024) for CPE.   Billie Lade, MD

## 2023-09-05 NOTE — Assessment & Plan Note (Signed)
Lipid panel updated in September.  Total cholesterol 159 and LDL 100. He is currently prescribed atorvastatin 10 mg 3 times weekly.  Previously documented intolerance to rosuvastatin, Zetia, higher doses of atorvastatin, and Inclisaran.  Discussing PCSK9 inhibitor with cardiology.

## 2023-09-05 NOTE — Assessment & Plan Note (Signed)
S/p prostatectomy (2007).  Not actively followed by urology or oncology.  Denies recent urinary symptoms.

## 2023-09-05 NOTE — Patient Instructions (Signed)
It was a pleasure to see you today.  Thank you for giving [place] the opportunity to be involved in your care.  Below is a brief recap of your visit and next steps.  We will plan to see you again in 1 year.  Summary You have established care today No medication changes were made No labs ordered We will tentatively plan for follow up in 1 year

## 2023-09-05 NOTE — Assessment & Plan Note (Signed)
He is currently prescribed amlodipine 5 mg daily, losartan 100 mg daily, and metoprolol succinate 25 mg daily.  BP is mildly elevated today at 142/70.  [name] reports fluctuating BP readings but states that home readings are typically lower.  No medication changes were made today.

## 2023-09-25 ENCOUNTER — Other Ambulatory Visit: Payer: Self-pay | Admitting: Cardiology

## 2023-10-23 ENCOUNTER — Other Ambulatory Visit: Payer: Self-pay | Admitting: Cardiology

## 2023-11-24 NOTE — Progress Notes (Deleted)
 Cardiology Office Note    Date:  11/24/2023   ID:  Kyle, Sheppard [birth year], MRN [id number]  PCP:  Kyle Lade, MD  Cardiologist:  Dr. [place]  No chief complaint on file.   History of Present Illness:  [name] is a 73 y.o. male with PMH of CAD, HTN, HLD (intolerant of statins), sinus bradycardia, history of provoked DVT, PVCs on previous 48-hour monitor, mild carotid artery disease and prostate cancer.  Previous cardiac catheterization performed in the setting of an STEMI on 09/21/2017 showed patent LAD stent with 50% mid LAD lesion, 60% mid left circumflex lesion, and 60% PDA lesion.  PDA lesion is unchanged from 2012.  Left circumflex lesion was significant by FFR and he underwent PCI with DES.  Later that night he had recurrent chest pain and was taken back to the Cath Lab.  Cardiac catheterization revealed patent left circumflex stent with dissection into the OM 3.  Medical therapy was planned as any reattempt with crossing with a wire could lead to worsening dissection.  Due to persistent symptoms, he underwent repeat cardiac cath on 01/11/2018, that showed widely patent stents, the wire dissection had healed completely, EF was normal at the time.  He underwent repeat cardiac cath on 08/03/2018 due to exertional chest discomfort.  This showed new restenosis in the left circumflex at the proximal stent margin, there was also progression of disease in the PDA.  Both sites were treated with DES.  He was enrolled in [place] 4 trial.  Based on the last note in February 2020, he did not wish to continue in [place] 4 trial due to myalgia after injection.  In 2020 he had recurrent chest pain.  He underwent repeat cardiac cath on 06/05/19 showing a new stenosis in the LCx just distal to prior stent. This was treated with repeat DES. Following cath he had persistent chests pain and was actually readmitted. Ecg was nonacute. Troponin was not c/w ACS. He did undergo repeat cath showing patent  stents. There was moderate mid LAD disease that was chronic and unchanged from prior studies. He did have an abnormal Flow wire assessment so this was stented as well. DC home on 06/08/19. Amlodipine dose increased for BP.  We later increased losartan to 50 mg daily for BP. He was seen by Pharm D for lipid therapy. Multiple options reviewed. He wanted to rechallenge with low dose Crestor with possible addition of Zetia if not at goal.   We did try him on Zetia. He states he took it for one week and had leg pain so bad he couldn't walk. States he developed aching all over. Stopped Crestor and it improved but is still present.    He has been taking lipitor 3 days a week and seems to tolerate this well. He brings a record of his BP and his readings at home have been excellent with pulse in the upper 50s. He states he did have bad fatigue and dizziness this summer that has improved. No significant chest pain.     Past Medical History:  Diagnosis Date   Abnormality of aortic valve    a. possibly bicuspid.   Asthma    Childhood   Carotid artery disease (HCC)    a. 1-39% bilaterally in 2017.   Coronary artery disease    a. anterior STEMI 04/2011, s/p DES to LAD.   DVT (deep venous thrombosis) (HCC)    a. tx with Xarelto x 5 months, stopped due  to hematuria.   Frequent PVCs    a. seen on Holter monitor.   Hyperlipidemia, mixed    Hypertension    Myocardial infarction (HCC) 05/21/2011   anterior wall, treated by Dr. Marcella Dubs with 3.0x72mm Promus drug eluting stent to proximal LAD    Peripheral arterial disease (HCC)    moderate left ICA disease, mild right ICA disease   Prostate cancer (HCC) 2007   Sinus bradycardia    a. HR 50s at home.   Statin intolerance    Unstable angina (HCC) 08/03/2018    Past Surgical History:  Procedure Laterality Date   CARDIAC CATHETERIZATION  05/23/2011, 05/21/2011   05/21/2011   3.0 x 24mm Promus Element drug eluting stent positioned in proximal LAD,  stenosis taken from 99% to 0%.   CORONARY PRESSURE/FFR STUDY N/A 09/21/2017   Procedure: INTRAVASCULAR PRESSURE WIRE/FFR STUDY;  Surgeon: Runell Gess, MD;  Location: [facility] INVASIVE CV LAB;  Service: Cardiovascular;  Laterality: N/A;   CORONARY PRESSURE/FFR STUDY N/A 06/07/2019   Procedure: INTRAVASCULAR PRESSURE WIRE/FFR STUDY;  Surgeon: Iran Ouch, MD;  Location: [facility]VASIVE CV LAB;  Service: Cardiovascular;  Laterality: N/A;   CORONARY STENT INTERVENTION N/A 09/21/2017   Procedure: CORONARY STENT INTERVENTION;  Surgeon: Runell Gess, MD;  Location: [facility];  Service: Cardiovascular;  Laterality: N/A;   CORONARY STENT INTERVENTION N/A 08/03/2018   Procedure: CORONARY STENT INTERVENTION;  Surgeon: [name], Trinity Hyland M, MD;  Location: [facility] INVASIVE CV LAB;  Service: Cardiovascular;  Laterality: N/A;   CORONARY STENT INTERVENTION N/A 06/05/2019   Procedure: CORONARY STENT INTERVENTION;  Surgeon: [name], Infant Doane M, MD;  Location: [facility] INVASIVE CV LAB;  Service: Cardiovascular;  Laterality: N/A;   CORONARY STENT INTERVENTION N/A 06/07/2019   Procedure: CORONARY STENT INTERVENTION;  Surgeon: Iran Ouch, MD;  Location: [facility];  Service: Cardiovascular;  Laterality: N/A;   CORONARY/GRAFT ACUTE MI REVASCULARIZATION N/A 09/21/2017   Procedure: Coronary/Graft Acute MI Revascularization;  Surgeon: [name], Bronsyn Shappell M, MD;  Location: [facility] INVASIVE CV LAB;  Service: Cardiovascular;  Laterality: N/A;   HERNIA REPAIR     LEFT HEART CATH AND CORONARY ANGIOGRAPHY N/A 09/21/2017   Procedure: LEFT HEART CATH AND CORONARY ANGIOGRAPHY;  Surgeon: [name], Ashe Gago M, MD;  Location: [facility] INVASIVE CV LAB;  Service: Cardiovascular;  Laterality: N/A;   LEFT HEART CATH AND CORONARY ANGIOGRAPHY N/A 09/21/2017   Procedure: LEFT HEART CATH AND CORONARY ANGIOGRAPHY;  Surgeon: Runell Gess, MD;  Location: [facility];  Service: Cardiovascular;  Laterality: N/A;   LEFT HEART CATH AND CORONARY  ANGIOGRAPHY N/A 01/11/2018   Procedure: LEFT HEART CATH AND CORONARY ANGIOGRAPHY;  Surgeon: [name], Rigoberto Repass M, MD;  Location: [facility] INVASIVE CV LAB;  Service: Cardiovascular;  Laterality: N/A;   LEFT HEART CATH AND CORONARY ANGIOGRAPHY N/A 08/03/2018   Procedure: LEFT HEART CATH AND CORONARY ANGIOGRAPHY;  Surgeon: [name], Rashidi Loh M, MD;  Location: [facility] INVASIVE CV LAB;  Service: Cardiovascular;  Laterality: N/A;   LEFT HEART CATH AND CORONARY ANGIOGRAPHY N/A 06/05/2019   Procedure: LEFT HEART CATH AND CORONARY ANGIOGRAPHY;  Surgeon: [name], Kevonta Phariss M, MD;  Location: [facility] INVASIVE CV LAB;  Service: Cardiovascular;  Laterality: N/A;   LEFT HEART CATH AND CORONARY ANGIOGRAPHY N/A 06/07/2019   Procedure: LEFT HEART CATH AND CORONARY ANGIOGRAPHY;  Surgeon: Iran Ouch, MD;  Location: [facility] INVASIVE CV LAB;  Service: Cardiovascular;  Laterality: N/A;   PROSTATECTOMY  2007    Current Medications: Outpatient Medications Prior to Visit  Medication Sig Dispense Refill   amLODipine (NORVASC) 5 MG tablet Take 1 tablet (5 mg total) by mouth daily. Please keep scheduled appointment 90 tablet 3   aspirin 81 MG tablet Take 81 mg by mouth daily.     atorvastatin (LIPITOR) 10 MG tablet Take 1 tablet (10 mg total) by mouth daily. 90 tablet 3   clopidogrel (PLAVIX) 75 MG tablet Take 1 tablet (75 mg total) by mouth daily. 90 tablet 1   Coenzyme Q10 (CO Q-10) 100 MG CAPS Take 100 mg by mouth daily.      losartan (COZAAR) 100 MG tablet TAKE 1 TABLET BY MOUTH ONCE DAILY. 90 tablet 2   metoprolol succinate (TOPROL XL) 25 MG 24 hr tablet Take 1 tablet (25 mg total) by mouth daily. 90 tablet 1   Multiple Vitamin (MULTIVITAMIN WITH MINERALS) TABS tablet Take 1 tablet by mouth daily.     nitroGLYCERIN (NITROSTAT) 0.4 MG SL tablet PLACE 1 UNDER TONGUE EVERY 5 MINUTES UP TO 3 DOSES AS NEEDED FOR CHEST PAIN. 25 tablet 3   No facility-administered medications prior to visit.     Allergies:   Adhesive [tape], Demerol [meperidine],  Fentanyl, and Statins   Social History   Socioeconomic History   Marital status: Married    Spouse name: Not on file   Number of children: 0   Years of education: Not on file   Highest education level: Associate degree: occupational, Scientist, product/process development, or vocational program  Occupational History   Not on file  Tobacco Use   Smoking status: Former    Current packs/day: 0.00    Average packs/day: 1 pack/day for 7.0 years (7.0 ttl pk-yrs)    Types: Cigarettes    Start date: 03/05/1970    Quit date: 09/26/1976    Years since quitting: 47.1   Smokeless tobacco: Never  Vaping Use   Vaping status: Never Used  Substance and Sexual Activity   Alcohol use: No    Alcohol/week: 0.0 standard drinks of alcohol   Drug use: No   Sexual activity: Never  Other Topics Concern   Not on file  Social History Narrative   Not on file   Social Drivers of Health   Financial Resource Strain: Low Risk  (09/04/2023)   Overall Financial Resource Strain (CARDIA)    Difficulty of Paying Living Expenses: Not hard at all  Food Insecurity: No Food Insecurity (09/04/2023)   Hunger Vital Sign    Worried About Running Out of Food in the Last Year: Never true    Ran Out of Food in the Last Year: Never true  Transportation Needs: No Transportation Needs (09/04/2023)   PRAPARE - Administrator, Civil Service (Medical): No    Lack of Transportation (Non-Medical): No  Physical Activity: Sufficiently Active (09/04/2023)   Exercise Vital Sign    Days of Exercise per Week: 5 days    Minutes of Exercise per Session: 40 min  Stress: No Stress Concern Present (09/04/2023)   [employer] of Occupational Health - Occupational Stress Questionnaire    Feeling of Stress : Only a little  Social Connections: Moderately Isolated (09/04/2023)   Social Connection and Isolation Panel [NHANES]    Frequency of Communication with Friends and Family: More than three times a week    Frequency of Social Gatherings with Friends  and Family: Once a week    Attends Religious Services: Never    Database administrator or Organizations: No    Attends Club or  Organization Meetings: Not on file    Marital Status: Married     Family History:  The patient's family history includes Heart failure in his father; Leukemia in his mother.   ROS:   Please see the history of present illness.    ROS All other systems reviewed and are negative.   PHYSICAL EXAM:   VS:  There were no vitals taken for this visit.   GEN: Well nourished, well developed, in no acute distress  HEENT: normal  Neck: no JVD, carotid bruits, or masses Cardiac: RRR; no murmurs, rubs, or gallops,no edema  Respiratory:  clear to auscultation bilaterally, normal work of breathing GI: soft, nontender, nondistended, + BS MS: no deformity or atrophy Skin: warm and dry, no rash Neuro:  Alert and Oriented x 3, Strength and sensation are intact Psych: euthymic mood, full affect  Wt Readings from Last 3 Encounters:  09/05/23 186 lb 12.8 oz (84.7 kg)  06/05/23 181 lb 12.8 oz (82.5 kg)  08/26/22 183 lb 3.2 oz (83.1 kg)      Studies/Labs Reviewed:          Recent Labs: 06/05/2023: ALT 19; Hemoglobin 15.2; Platelets 258; TSH 4.290 06/08/2023: BUN 19; Creatinine, Ser 1.14; Potassium 5.1; Sodium 142   Lipid Panel    Component Value Date/Time   CHOL 159 06/05/2023 0857   CHOL 152 03/22/2013 0813   TRIG 134 06/05/2023 0857   TRIG 83 03/22/2013 0813   HDL 35 (L) 06/05/2023 0857   HDL 39 (L) 03/22/2013 0813   CHOLHDL 4.5 06/05/2023 0857   CHOLHDL 5.5 12/28/2017 1027   VLDL 43 (H) 12/28/2017 1027   LDLCALC 100 (H) 06/05/2023 0857   LDLCALC 96 03/22/2013 0813    Additional studies/ records that were reviewed today include:   Cath 08/03/2018 Prox LAD lesion is 45% stenosed. Ost RPDA to RPDA lesion is 80% stenosed. A drug-eluting stent was successfully placed using a STENT SYNERGY DES 2.25X12. Post intervention, there is a 0% residual stenosis. Ost  LAD to Prox LAD lesion is 35% stenosed. Prox Cx-1 lesion is 95% stenosed. Post intervention, there is a 0% residual stenosis. A drug-eluting stent was successfully placed using a STENT SYNERGY DES 3X12. Non-stenotic Prox Cx-2 lesion was previously treated. Post Atrio lesion is 30% stenosed. LV end diastolic pressure is normal.   1. 2 vessel obstructive CAD    - patent stent in the proximal LAD. Diffuse 45-50% mid LAD    - 95% stenosis in the LCx at the proximal stent margin    - 80% PDA 2. Normal LVEDP 3. Successful PCI of the LCx with DES x 1 overlapping the prior stent 4. Successful PCI of the PDA with DES x 1.    Plan: Anticipate same day discharge.    Recommend uninterrupted dual antiplatelet therapy with Aspirin 81mg  daily and Clopidogrel 75 mg daily for a minimum of 12 months (ACS - Class I recommendation).  Cath 06/05/19:  CORONARY STENT INTERVENTION  LEFT HEART CATH AND CORONARY ANGIOGRAPHY  Conclusion    Prox LAD lesion is 45% stenosed. Ost LAD to Prox LAD lesion is 40% stenosed. Non-stenotic Prox Cx-2 lesion was previously treated. Previously placed Prox Cx-1 drug eluting stent is widely patent. Prox RCA lesion is 25% stenosed. Previously placed Ost RPDA to RPDA drug eluting stent is widely patent. RPAV lesion is 30% stenosed. Mid Cx lesion is 90% stenosed. A drug-eluting stent was successfully placed using a STENT RESOLUTE ONYX 2.5X15. Post intervention, there is a 0%  residual stenosis. The left ventricular systolic function is normal. LV end diastolic pressure is normal. The left ventricular ejection fraction is 55-65% by visual estimate.   1. Single vessel obstructive CAD. 90% stenosis in the mid LCx distal to prior stents into the OM1 2. Continued stent patency in the LAD, PDA and proximal to mid LCx 3. Normal LV function 4. Normal LVEDP 5. Successful PCI of the mid LCx/OM1 with DES x 1 in overlapping fashion   Plan: DAPT indefinitely. He is a candidate for  same day discharge. Needs referral to lipid clinic to address his hypercholesterolemia and intolerance to statins. With aggressive CAD target LDL <50.      Cardiac cath 06/07/19:  CORONARY STENT INTERVENTION  INTRAVASCULAR PRESSURE WIRE/FFR STUDY  LEFT HEART CATH AND CORONARY ANGIOGRAPHY  Conclusion    Ost LAD to Prox LAD lesion is 40% stenosed. Non-stenotic Prox Cx-1 lesion was previously treated. Previously placed Prox Cx-2 drug eluting stent is widely patent. Balloon angioplasty was performed. Previously placed Mid Cx drug eluting stent is widely patent. Balloon angioplasty was performed. Non-stenotic Ost RPDA to RPDA lesion was previously treated. RPAV lesion is 30% stenosed. Prox RCA lesion is 30% stenosed. Prox LAD to Mid LAD lesion is 70% stenosed. Post intervention, there is a 0% residual stenosis. A drug-eluting stent was successfully placed using a STENT RESOLUTE ONYX 3.0X30. The left ventricular systolic function is normal. LV end diastolic pressure is normal. The left ventricular ejection fraction is 55-65% by visual estimate.   1.  Widely patent left circumflex and RCA stents.  There is a stent in the proximal LAD which is patent with moderate in-stent restenosis in the proximal segment which was not significant by DFR (0.94).  In addition, the LAD has 70% stenosis in the midsegment distal to the previously placed stent.  This was significant by DFR (0.85) and thus this was treated with PCI and drug-eluting stent placement.  The stent overlapped with the previously placed proximal stent. 2.  Normal LV systolic function and left ventricular end-diastolic pressure.   Recommendations: Dual antiplatelet therapy for at least 6 months and preferably long-term given multiple overlapped stents. Recommend referral to the lipid clinic for treatment of hyperlipidemia.       ASSESSMENT:    No diagnosis found.     PLAN:  In order of problems listed above:  CAD s/p  multiple prior stent procedures. S/p stent of LCx for restenosis in September 2020. He had stenting of the mid LAD for abnormal FFR 2 weeks later for continued chest pain. Denies any current chest pain. Continue aspirin and Plavix indefinitely.   2.   Hypertension: Blood pressure is acceptable based on home readings. Given his symptoms of fatigue will reduce metoprolol dose and switch to succinate formulation- so Toprol XL 25 mg daily.  Continue current doses of amlodipine, and losartan. Prescriptions refilled.    3.   Hyperlipidemia: With multiple prior stent procedures and aggressive CAD I would like for him to be on aggressive lipid lowering therapy. Seen in lipid clinic in past intolerant of Crestor, Zetia, lipitor, and Inclisaran ([place] trial injection). He is on lipitor 10 mg 3 x/week. Will update labs today. He is somewhat more receptive to the idea of a [place]CS[place] 9 inhibitor but he wishes to see how the lipitor does now.   4. Fatigue. Check TSH, CBC, chemistries. Reduce metoprolol as noted.     Medication Adjustments/Labs and Tests Ordered: Current medicines are reviewed at length with the patient  today.  Concerns regarding medicines are outlined above.  Medication changes, Labs and Tests ordered today are listed in the Patient Instructions below. There are no Patient Instructions on file for this visit.    Signed, Helon Wisinski [place], MD  11/24/2023 1:04 PM    [facility] [address] [place], [place], [place]  [address] Phone: [phone]; Fax: [phone]

## 2023-11-28 ENCOUNTER — Telehealth (INDEPENDENT_AMBULATORY_CARE_PROVIDER_SITE_OTHER): Admitting: Internal Medicine

## 2023-11-28 ENCOUNTER — Telehealth: Payer: Self-pay

## 2023-11-28 ENCOUNTER — Encounter: Payer: Self-pay | Admitting: Internal Medicine

## 2023-11-28 DIAGNOSIS — U071 COVID-19: Secondary | ICD-10-CM | POA: Insufficient documentation

## 2023-11-28 MED ORDER — NIRMATRELVIR/RITONAVIR (PAXLOVID)TABLET: 3.0000 | ORAL_TABLET | Freq: Two times a day (BID) | ORAL | 0 refills | Status: AC

## 2023-11-28 NOTE — Telephone Encounter (Signed)
 Copied from CRM [id number]. Topic: Clinical - Medication Question >> Nov 28, 2023  9:06 AM Maxwell Marion wrote: Reason for CRM: Patient called in wanting to know if Dr. Durwin Nora can call in paxlovid to [facility] for him because he tested positive for Covid after taking a home test. He says his symptoms are going away but wanted to know if that could be called in for him.

## 2023-11-28 NOTE — Assessment & Plan Note (Signed)
 Evaluated today for an acute visit through video encounter in the setting of COVID-19.  As otherwise documented, symptom onset 2/28, COVID positive earlier today.  He is currently managing symptoms with over-the-counter cough and cold medications.  Given comorbid conditions, he qualifies for Paxlovid.  A prescription was sent to his pharmacy today.  Recommend continuing current supportive care measures.  He was instructed to return to care if symptoms worsen or fail to improve.  Otherwise, he is scheduled for routine follow-up in December 2025.

## 2023-11-28 NOTE — Telephone Encounter (Signed)
Appt scheduled/ patient aware.

## 2023-11-28 NOTE — Progress Notes (Signed)
 Virtual Visit via Video Note  I connected with [name] on 11/28/23 at  1:20 PM EST by a video enabled telemedicine application and verified that I am speaking with the correct person using two identifiers.  Patient Location: Home Provider Location: Home Office  I discussed the limitations, risks, security, and privacy concerns of performing an evaluation and management service by video and the availability of in person appointments. I also discussed with the patient that there may be a patient responsible charge related to this service. The patient expressed understanding and agreed to proceed.  Subjective: PCP: Kyle Lade, MD  Chief Complaint  Patient presents with   Covid Positive   [name] has been evaluated today for an acute visit through virtual encounter in the setting of COVID-19.  He he endorses symptom onset 2/28.  COVID positive today (3/4).  He currently endorses chest congestion with a nonproductive cough, sinus congestion, and subjective fever.  He has tried using over-the-counter cough and cold medications.  He is interested in Paxlovid.  His wife is also tested positive for COVID-19.  Denies shortness of breath, diarrhea, throat irritation, headache, and nausea/vomiting.  ROS: Per HPI  Current Outpatient Medications:    nirmatrelvir/ritonavir (PAXLOVID) 20 x 150 MG & 10 x 100MG  TABS, Take 3 tablets by mouth 2 (two) times daily for 5 days. (Take nirmatrelvir 150 mg two tablets twice daily for 5 days and ritonavir 100 mg one tablet twice daily for 5 days) Patient GFR is 68, Disp: 30 tablet, Rfl: 0   amLODipine (NORVASC) 5 MG tablet, Take 1 tablet (5 mg total) by mouth daily. Please keep scheduled appointment, Disp: 90 tablet, Rfl: 3   aspirin 81 MG tablet, Take 81 mg by mouth daily., Disp: , Rfl:    atorvastatin (LIPITOR) 10 MG tablet, Take 1 tablet (10 mg total) by mouth daily., Disp: 90 tablet, Rfl: 3   clopidogrel (PLAVIX) 75 MG tablet, Take 1 tablet (75  mg total) by mouth daily., Disp: 90 tablet, Rfl: 1   Coenzyme Q10 (CO Q-10) 100 MG CAPS, Take 100 mg by mouth daily. , Disp: , Rfl:    losartan (COZAAR) 100 MG tablet, TAKE 1 TABLET BY MOUTH ONCE DAILY., Disp: 90 tablet, Rfl: 2   metoprolol succinate (TOPROL XL) 25 MG 24 hr tablet, Take 1 tablet (25 mg total) by mouth daily., Disp: 90 tablet, Rfl: 1   Multiple Vitamin (MULTIVITAMIN WITH MINERALS) TABS tablet, Take 1 tablet by mouth daily., Disp: , Rfl:    nitroGLYCERIN (NITROSTAT) 0.4 MG SL tablet, PLACE 1 UNDER TONGUE EVERY 5 MINUTES UP TO 3 DOSES AS NEEDED FOR CHEST PAIN., Disp: 25 tablet, Rfl: 3  Assessment and Plan:  COVID-19 Assessment & Plan: Evaluated today for an acute visit through video encounter in the setting of COVID-19.  As otherwise documented, symptom onset 2/28, COVID positive earlier today.  He is currently managing symptoms with over-the-counter cough and cold medications.  Given comorbid conditions, he qualifies for Paxlovid.  A prescription was sent to his pharmacy today.  Recommend continuing current supportive care measures.  He was instructed to return to care if symptoms worsen or fail to improve.  Otherwise, he is scheduled for routine follow-up in December 2025.  Follow Up Instructions: Return if symptoms worsen or fail to improve.   I discussed the assessment and treatment plan with the patient. The patient was provided an opportunity to ask questions, and all were answered. The patient agreed with the plan  and demonstrated an understanding of the instructions.   The patient was advised to call back or seek an in-person evaluation if the symptoms worsen or if the condition fails to improve as anticipated.  The above assessment and management plan was discussed with the patient. The patient verbalized understanding of and has agreed to the management plan.   Kyle Lade, MD

## 2023-12-04 ENCOUNTER — Ambulatory Visit: Payer: HMO | Admitting: Cardiology

## 2023-12-31 NOTE — Progress Notes (Unsigned)
 Cardiology Office Note    Date:  12/31/2023   ID:  Warden, Buffa 30-Aug-1951, MRN [id number]  PCP:  Billie Lade, MD  Cardiologist:  Dr. [place]az[place]  No chief complaint on file.   History of Present Illness:  [name] is a 73 y.o. male with PMH of CAD, HTN, HLD (intolerant of statins), sinus bradycardia, history of provoked DVT, PVCs on previous 48-hour monitor, mild carotid artery disease and prostate cancer.  Previous cardiac catheterization performed in the setting of an STEMI on 09/21/2017 showed patent LAD stent with 50% mid LAD lesion, 60% mid left circumflex lesion, and 60% PDA lesion.  PDA lesion is unchanged from 2012.  Left circumflex lesion was significant by FFR and he underwent PCI with DES.  Later that night he had recurrent chest pain and was taken back to the Cath Lab.  Cardiac catheterization revealed patent left circumflex stent with dissection into the OM 3.  Medical therapy was planned as any reattempt with crossing with a wire could lead to worsening dissection.  Due to persistent symptoms, he underwent repeat cardiac cath on 01/11/2018, that showed widely patent stents, the wire dissection had healed completely, EF was normal at the time.  He underwent repeat cardiac cath on 08/03/2018 due to exertional chest discomfort.  This showed new restenosis in the left circumflex at the proximal stent margin, there was also progression of disease in the PDA.  Both sites were treated with DES.  He was enrolled in [place] 4 trial.  Based on the last note in February 2020, he did not wish to continue in [place] 4 trial due to myalgia after injection.  In 2020 he had recurrent chest pain.  He underwent repeat cardiac cath on 06/05/19 showing a new stenosis in the LCx just distal to prior stent. This was treated with repeat DES. Following cath he had persistent chests pain and was actually readmitted. Ecg was nonacute. Troponin was not c/w ACS. He did undergo repeat cath showing patent  stents. There was moderate mid LAD disease that was chronic and unchanged from prior studies. He did have an abnormal Flow wire assessment so this was stented as well. DC home on 06/08/19. Amlodipine dose increased for BP.  We later increased losartan to 50 mg daily for BP. He was seen by Pharm D for lipid therapy. Multiple options reviewed. He wanted to rechallenge with low dose Crestor with possible addition of Zetia if not at goal.   We did try him on Zetia. He states he took it for one week and had leg pain so bad he couldn't walk. States he developed aching all over. Stopped Crestor and it improved but is still present.    He has been taking lipitor 3 days a week and seems to tolerate this well. He brings a record of his BP and his readings at home have been excellent with pulse in the upper 50s. He states he did have bad fatigue and dizziness this summer that has improved. No significant chest pain.     Past Medical History:  Diagnosis Date  . Abnormality of aortic valve    a. possibly bicuspid.  . Asthma    Childhood  . Carotid artery disease (HCC)    a. 1-39% bilaterally in 2017.  Marland Kitchen Coronary artery disease    a. anterior STEMI 04/2011, s/p DES to LAD.  Marland Kitchen DVT (deep venous thrombosis) (HCC)    a. tx with Xarelto x 5 months, stopped due  to hematuria.  . Frequent PVCs    a. seen on Holter monitor.  . Hyperlipidemia, mixed   . Hypertension   . Myocardial infarction (HCC) 05/21/2011   anterior wall, treated by Dr. Marcella Dubs with 3.0x46mm Promus drug eluting stent to proximal LAD   . Peripheral arterial disease (HCC)    moderate left ICA disease, mild right ICA disease  . Prostate cancer (HCC) 2007  . Sinus bradycardia    a. HR 50s at home.  . Statin intolerance   . Unstable angina (HCC) 08/03/2018    Past Surgical History:  Procedure Laterality Date  . CARDIAC CATHETERIZATION  05/23/2011, 05/21/2011   05/21/2011   3.0 x 24mm Promus Element drug eluting stent positioned in  proximal LAD, stenosis taken from 99% to 0%.  . CORONARY PRESSURE/FFR STUDY N/A 09/21/2017   Procedure: INTRAVASCULAR PRESSURE WIRE/FFR STUDY;  Surgeon: Runell Gess, MD;  Location: [facility] INVASIVE CV LAB;  Service: Cardiovascular;  Laterality: N/A;  . CORONARY PRESSURE/FFR STUDY N/A 06/07/2019   Procedure: INTRAVASCULAR PRESSURE WIRE/FFR STUDY;  Surgeon: Iran Ouch, MD;  Location: [facility] INVASIVE CV LAB;  Service: Cardiovascular;  Laterality: N/A;  . CORONARY STENT INTERVENTION N/A 09/21/2017   Procedure: CORONARY STENT INTERVENTION;  Surgeon: Runell Gess, MD;  Location: [facility] INVASIVE CV LAB;  Service: Cardiovascular;  Laterality: N/A;  . CORONARY STENT INTERVENTION N/A 08/03/2018   Procedure: CORONARY STENT INTERVENTION;  Surgeon: Swaziland, Lavalle Skoda M, MD;  Location: [facility] INVASIVE CV LAB;  Service: Cardiovascular;  Laterality: N/A;  . CORONARY STENT INTERVENTION N/A 06/05/2019   Procedure: CORONARY STENT INTERVENTION;  Surgeon: Sw[place]iland, Martino Tompson M, MD;  Location: [facility] INVASIVE CV LAB;  Service: Cardiovascular;  Laterality: N/A;  . CORONARY STENT INTERVENTION N/A 06/07/2019   Procedure: CORONARY STENT INTERVENTION;  Surgeon: Iran Ouch, MD;  Location: [facility] INVASIVE CV LAB;  Service: Cardiovascular;  Laterality: N/A;  . CORONARY/GRAFT ACUTE MI REVASCULARIZATION N/A 09/21/2017   Procedure: Coronary/Graft Acute MI Revascularization;  Surgeon: Swaziland, Giovana Faciane M, MD;  Location: [facility] INVASIVE CV LAB;  Service: Cardiovascular;  Laterality: N/A;  . HERNIA REPAIR    . LEFT HEART CATH AND CORONARY ANGIOGRAPHY N/A 09/21/2017   Procedure: LEFT HEART CATH AND CORONARY ANGIOGRAPHY;  Surgeon: Swaziland, Zoey Gilkeson M, MD;  Location: [facility] INVASIVE CV LAB;  Service: Cardiovascular;  Laterality: N/A;  . LEFT HEART CATH AND CORONARY ANGIOGRAPHY N/A 09/21/2017   Procedure: LEFT HEART CATH AND CORONARY ANGIOGRAPHY;  Surgeon: Runell Gess, MD;  Location: [facility] INVAS[facility] CV LAB;  Service: Cardiovascular;  Laterality: N/A;  . LEFT HEART  CATH AND CORONARY ANGIOGRAPHY N/A 01/11/2018   Procedure: LEFT HEART CATH AND CORONARY ANGIOGRAPHY;  Surgeon: Swaziland, Sly Parlee M, MD;  Location: [facility] INVASIVE CV LAB;  Service: Cardiovascular;  Laterality: N/A;  . LEFT HEART CATH AND CORONARY ANGIOGRAPHY N/A 08/03/2018   Procedure: LEFT HEART CATH AND CORONARY ANGIOGRAPHY;  Surgeon: Swazil[place], Davene Jobin M, MD;  Location: [facility] INVASIVE CV LAB;  Service: Cardiovascular;  Laterality: N/A;  . LEFT HEART CATH AND CORONARY ANGIOGRAPHY N/A 06/05/2019   Procedure: LEFT HEART CATH AND CORONARY ANGIOGRAPHY;  Surgeon: Sw[place]il[place], Kamden Reber M, MD;  Location: [facility] INVASIVE CV LAB;  Service: Cardiovascular;  Laterality: N/A;  . LEFT HEART CATH AND CORONARY ANGIOGRAPHY N/A 06/07/2019   Procedure: LEFT HEART CATH AND CORONARY ANGIOGRAPHY;  Surgeon: Iran Ouch, MD;  Location: [facility] INVASIVE CV LAB;  Service: Cardiovascular;  Laterality: N/A;  . PROSTATECTOMY  2007    Current Medications: Outpatient Medications Prior to Visit  Medication Sig Dispense Refill  . amLODipine (NORVASC) 5 MG tablet Take 1 tablet (5 mg total) by mouth daily. Please keep scheduled appointment 90 tablet 3  . aspirin 81 MG tablet Take 81 mg by mouth daily.    Marland Kitchen atorvastatin (LIPITOR) 10 MG tablet Take 1 tablet (10 mg total) by mouth daily. 90 tablet 3  . clopidogrel (PLAVIX) 75 MG tablet Take 1 tablet (75 mg total) by mouth daily. 90 tablet 1  . Coenzyme Q10 (CO Q-10) 100 MG CAPS Take 100 mg by mouth daily.     Marland Kitchen losartan (COZAAR) 100 MG tablet TAKE 1 TABLET BY MOUTH ONCE DAILY. 90 tablet 2  . metoprolol succinate (TOPROL XL) 25 MG 24 hr tablet Take 1 tablet (25 mg total) by mouth daily. 90 tablet 1  . Multiple Vitamin (MULTIVITAMIN WITH MINERALS) TABS tablet Take 1 tablet by mouth daily.    . nitroGLYCERIN (NITROSTAT) 0.4 MG SL tablet PLACE 1 UNDER TONGUE EVERY 5 MINUTES UP TO 3 DOSES AS NEEDED FOR CHEST PAIN. 25 tablet 3   No facility-administered medications prior to visit.     Allergies:   Adhesive  [tape], Demerol [meperidine], Fentanyl, and Statins   Social History   Socioeconomic History  . Marital status: Married    Spouse name: Not on file  . Number of children: 0  . Years of education: Not on file  . Highest education level: Associate degree: occupational, Scientist, product/process development, or vocational program  Occupational History  . Not on file  Tobacco Use  . Smoking status: Former    Current packs/day: 0.00    Average packs/day: 1 pack/day for 7.0 years (7.0 ttl pk-yrs)    Types: Cigarettes    Start date: 03/05/1970    Quit date: 09/26/1976    Years since quitting: 47.2  . Smokeless tobacco: Never  Vaping Use  . Vaping status: Never Used  Substance and Sexual Activity  . Alcohol use: No    Alcohol/week: 0.0 standard drinks of alcohol  . Drug use: No  . Sexual activity: Never  Other Topics Concern  . Not on file  Social History Narrative  . Not on file   Social Drivers of Health   Financial Resource Strain: Low Risk  (09/04/2023)   Overall Financial Resource Strain (CARDIA)   . Difficulty of Paying Living Expenses: Not hard at all  Food Insecurity: No Food Insecurity (09/04/2023)   Hunger Vital Sign   . Worried About Programme researcher, broadcasting/film/video in the Last Year: Never true   . Ran Out of Food in the Last Year: Never true  Transportation Needs: No Transportation Needs (09/04/2023)   PRAPARE - Transportation   . Lack of Transportation (Medical): No   . Lack of Transportation (Non-Medical): No  Physical Activity: Sufficiently Active (09/04/2023)   Exercise Vital Sign   . Days of Exercise per Week: 5 days   . Minutes of Exercise per Session: 40 min  Stress: No Stress Concern Present (09/04/2023)   [employer] of Occupational Health - Occupational Stress Questionnaire   . Feeling of Stress : Only a little  Social Connections: Moderately Isolated (09/04/2023)   Social Connection and Isolation Panel [NHANES]   . Frequency of Communication with Friends and Family: More than three times a  week   . Frequency of Social Gatherings with Friends and Family: Once a week   . Attends Religious Services: Never   . Active Member of Clubs or Organizations: No   . Attends Club or  Organization Meetings: Not on file   . Marital Status: Married     Family History:  The patient's family history includes Heart failure in his father; Leukemia in his mother.   ROS:   Please see the history of present illness.    ROS All other systems reviewed and are negative.   PHYSICAL EXAM:   VS:  There were no vitals taken for this visit.   GEN: Well nourished, well developed, in no acute distress  HEENT: normal  Neck: no JVD, carotid bruits, or masses Cardiac: RRR; no murmurs, rubs, or gallops,no edema  Respiratory:  clear to auscultation bilaterally, normal work of breathing GI: soft, nontender, nondistended, + BS MS: no deformity or atrophy Skin: warm and dry, no rash Neuro:  Alert and Oriented x 3, Strength and sensation are intact Psych: euthymic mood, full affect  Wt Readings from Last 3 Encounters:  09/05/23 186 lb 12.8 oz (84.7 kg)  06/05/23 181 lb 12.8 oz (82.5 kg)  08/26/22 183 lb 3.2 oz (83.1 kg)      Studies/Labs Reviewed:          Recent Labs: 06/05/2023: ALT 19; Hemoglobin 15.2; Platelets 258; TSH 4.290 06/08/2023: BUN 19; Creatinine, Ser 1.14; Potassium 5.1; Sodium 142   Lipid Panel    Component Value Date/Time   CHOL 159 06/05/2023 0857   CHOL 152 03/22/2013 0813   TRIG 134 06/05/2023 0857   TRIG 83 03/22/2013 0813   HDL 35 (L) 06/05/2023 0857   HDL 39 (L) 03/22/2013 0813   CHOLHDL 4.5 06/05/2023 0857   CHOLHDL 5.5 12/28/2017 1027   VLDL 43 (H) 12/28/2017 1027   LDLCALC 100 (H) 06/05/2023 0857   LDLCALC 96 03/22/2013 0813    Additional studies/ records that were reviewed today include:   Cath 08/03/2018 Prox LAD lesion is 45% stenosed. Ost RPDA to RPDA lesion is 80% stenosed. A drug-eluting stent was successfully placed using a STENT SYNERGY DES  2.25X12. Post intervention, there is a 0% residual stenosis. Ost LAD to Prox LAD lesion is 35% stenosed. Prox Cx-1 lesion is 95% stenosed. Post intervention, there is a 0% residual stenosis. A drug-eluting stent was successfully placed using a STENT SYNERGY DES 3X12. Non-stenotic Prox Cx-2 lesion was previously treated. Post Atrio lesion is 30% stenosed. LV end diastolic pressure is normal.   1. 2 vessel obstructive CAD    - patent stent in the proximal LAD. Diffuse 45-50% mid LAD    - 95% stenosis in the LCx at the proximal stent margin    - 80% PDA 2. Normal LVEDP 3. Successful PCI of the LCx with DES x 1 overlapping the prior stent 4. Successful PCI of the PDA with DES x 1.    Plan: Anticipate same day discharge.    Recommend uninterrupted dual antiplatelet therapy with Aspirin 81mg  daily and Clopidogrel 75 mg daily for a minimum of 12 months (ACS - Class I recommendation).  Cath 06/05/19:  CORONARY STENT INTERVENTION  LEFT HEART CATH AND CORONARY ANGIOGRAPHY  Conclusion    Prox LAD lesion is 45% stenosed. Ost LAD to Prox LAD lesion is 40% stenosed. Non-stenotic Prox Cx-2 lesion was previously treated. Previously placed Prox Cx-1 drug eluting stent is widely patent. Prox RCA lesion is 25% stenosed. Previously placed Ost RPDA to RPDA drug eluting stent is widely patent. RPAV lesion is 30% stenosed. Mid Cx lesion is 90% stenosed. A drug-eluting stent was successfully placed using a STENT RESOLUTE ONYX 2.5X15. Post intervention, there is a 0%  residual stenosis. The left ventricular systolic function is normal. LV end diastolic pressure is normal. The left ventricular ejection fraction is 55-65% by visual estimate.   1. Single vessel obstructive CAD. 90% stenosis in the mid LCx distal to prior stents into the OM1 2. Continued stent patency in the LAD, PDA and proximal to mid LCx 3. Normal LV function 4. Normal LVEDP 5. Successful PCI of the mid LCx/OM1 with DES x 1 in  overlapping fashion   Plan: DAPT indefinitely. He is a candidate for same day discharge. Needs referral to lipid clinic to address his hypercholesterolemia and intolerance to statins. With aggressive CAD target LDL <50.      Cardiac cath 06/07/19:  CORONARY STENT INTERVENTION  INTRAVASCULAR PRESSURE WIRE/FFR STUDY  LEFT HEART CATH AND CORONARY ANGIOGRAPHY  Conclusion    Ost LAD to Prox LAD lesion is 40% stenosed. Non-stenotic Prox Cx-1 lesion was previously treated. Previously placed Prox Cx-2 drug eluting stent is widely patent. Balloon angioplasty was performed. Previously placed Mid Cx drug eluting stent is widely patent. Balloon angioplasty was performed. Non-stenotic Ost RPDA to RPDA lesion was previously treated. RPAV lesion is 30% stenosed. Prox RCA lesion is 30% stenosed. Prox LAD to Mid LAD lesion is 70% stenosed. Post intervention, there is a 0% residual stenosis. A drug-eluting stent was successfully placed using a STENT RESOLUTE ONYX 3.0X30. The left ventricular systolic function is normal. LV end diastolic pressure is normal. The left ventricular ejection fraction is 55-65% by visual estimate.   1.  Widely patent left circumflex and RCA stents.  There is a stent in the proximal LAD which is patent with moderate in-stent restenosis in the proximal segment which was not significant by DFR (0.94).  In addition, the LAD has 70% stenosis in the midsegment distal to the previously placed stent.  This was significant by DFR (0.85) and thus this was treated with PCI and drug-eluting stent placement.  The stent overlapped with the previously placed proximal stent. 2.  Normal LV systolic function and left ventricular end-diastolic pressure.   Recommendations: Dual antiplatelet therapy for at least 6 months and preferably long-term given multiple overlapped stents. Recommend referral to the lipid clinic for treatment of hyperlipidemia.       ASSESSMENT:    No diagnosis  found.     PLAN:  In order of problems listed above:  CAD s/p multiple prior stent procedures. S/p stent of LCx for restenosis in September 2020. He had stenting of the mid LAD for abnormal FFR 2 weeks later for continued chest pain. Denies any current chest pain. Continue aspirin and Plavix indefinitely.   2.   Hypertension: Blood pressure is acceptable based on home readings. Given his symptoms of fatigue will reduce metoprolol dose and switch to succinate formulation- so Toprol XL 25 mg daily.  Continue current doses of amlodipine, and losartan. Prescriptions refilled.    3.   Hyperlipidemia: With multiple prior stent procedures and aggressive CAD I would like for him to be on aggressive lipid lowering therapy. Seen in lipid clinic in past intolerant of Crestor, Zetia, lipitor, and Inclisaran ([place] trial injection). He is on lipitor 10 mg 3 x/week. Will update labs today. He is somewhat more receptive to the idea of a [place] 9 inhibitor but he wishes to see how the lipitor does now.   4. Fatigue. Check TSH, CBC, chemistries. Reduce metoprolol as noted.     Medication Adjustments/Labs and Tests Ordered: Current medicines are reviewed at length with the patient  today.  Concerns regarding medicines are outlined above.  Medication changes, Labs and Tests ordered today are listed in the Patient Instructions below. There are no Patient Instructions on file for this visit.    Signed, Kenzee Bassin Swaziland, MD  12/31/2023 9:41 AM    [facility] [address] [place], [place], [place]  [address] Phone: [phone]; Fax: [phone]

## 2024-01-04 ENCOUNTER — Ambulatory Visit: Attending: Cardiology | Admitting: Cardiology

## 2024-01-04 ENCOUNTER — Encounter: Payer: Self-pay | Admitting: Cardiology

## 2024-01-04 VITALS — BP 150/84 | HR 60 | Ht 66.0 in | Wt 180.4 lb

## 2024-01-04 DIAGNOSIS — I10 Essential (primary) hypertension: Secondary | ICD-10-CM | POA: Diagnosis not present

## 2024-01-04 DIAGNOSIS — E782 Mixed hyperlipidemia: Secondary | ICD-10-CM

## 2024-01-04 DIAGNOSIS — Z9861 Coronary angioplasty status: Secondary | ICD-10-CM

## 2024-01-04 DIAGNOSIS — I2510 Atherosclerotic heart disease of native coronary artery without angina pectoris: Secondary | ICD-10-CM | POA: Diagnosis not present

## 2024-01-04 NOTE — Patient Instructions (Signed)
 Medication Instructions:  Continue same medications *If you need a refill on your cardiac medications before your next appointment, please call your pharmacy*  Lab Work: None ordered  Testing/Procedures: None ordered  Follow-Up: At [facility], you and your health needs are our priority.  As part of our continuing mission to provide you with exceptional heart care, our providers are all part of one team.  This team includes your primary Cardiologist (physician) and Advanced Practice Providers or APPs (Physician Assistants and Nurse Practitioners) who all work together to provide you with the care you need, when you need it.  Your next appointment:  6 months   Call in July to schedule Oct appointment     Provider:  Dr.Jordan  We recommend signing up for the patient portal called "MyChart".  Sign up information is provided on this After Visit Summary.  MyChart is used to connect with patients for Virtual Visits (Telemedicine).  Patients are able to view lab/test results, encounter notes, upcoming appointments, etc.  Non-urgent messages can be sent to your provider as well.   To learn more about what you can do with MyChart, go to [email or web address].         1st Floor: - Lobby - Registration  - Pharmacy  - Lab - Cafe  2nd Floor: - PV Lab - Diagnostic Testing (echo, CT, nuclear med)  3rd Floor: - Vacant  4th Floor: - TCTS (cardiothoracic surgery) - AFib Clinic - Structural Heart Clinic - Vascular Surgery  - Vascular Ultrasound  5th Floor: - HeartCare Cardiology (general and EP) - Clinical Pharmacy for coumadin, hypertension, lipid, weight-loss medications, and med management appointments    Valet parking services will be available as well.

## 2024-01-19 ENCOUNTER — Other Ambulatory Visit: Payer: Self-pay | Admitting: Cardiology

## 2024-01-22 ENCOUNTER — Other Ambulatory Visit: Payer: Self-pay | Admitting: Cardiology

## 2024-02-05 ENCOUNTER — Other Ambulatory Visit: Payer: Self-pay | Admitting: Cardiology

## 2024-03-12 ENCOUNTER — Encounter: Payer: Self-pay | Admitting: Cardiology

## 2024-03-18 NOTE — Telephone Encounter (Signed)
 Spoke to patient appointment scheduled with Dr.Jordan 10/6 at 9:00 am.

## 2024-04-10 DIAGNOSIS — H5203 Hypermetropia, bilateral: Secondary | ICD-10-CM | POA: Diagnosis not present

## 2024-04-10 DIAGNOSIS — H43813 Vitreous degeneration, bilateral: Secondary | ICD-10-CM | POA: Diagnosis not present

## 2024-04-10 DIAGNOSIS — H353132 Nonexudative age-related macular degeneration, bilateral, intermediate dry stage: Secondary | ICD-10-CM | POA: Diagnosis not present

## 2024-04-10 DIAGNOSIS — H52201 Unspecified astigmatism, right eye: Secondary | ICD-10-CM | POA: Diagnosis not present

## 2024-04-10 DIAGNOSIS — H524 Presbyopia: Secondary | ICD-10-CM | POA: Diagnosis not present

## 2024-04-10 DIAGNOSIS — H2513 Age-related nuclear cataract, bilateral: Secondary | ICD-10-CM | POA: Diagnosis not present

## 2024-04-10 DIAGNOSIS — H25013 Cortical age-related cataract, bilateral: Secondary | ICD-10-CM | POA: Diagnosis not present

## 2024-05-20 NOTE — Telephone Encounter (Signed)
 Scheduled

## 2024-06-22 NOTE — Progress Notes (Signed)
 Cardiology Office Note    Date:  07/01/2024   ID:  [name], DOB [birth year], MRN [id number]  PCP:  No primary care provider on file.  Cardiologist:  Dr. [place]  Chief Complaint  Patient presents with   CAD S/P percutaneous coronary angioplasty   Essential hypertension\    History of Present Illness:  [name] is a 73 y.o. male with PMH of CAD, HTN, HLD (intolerant of statins), sinus bradycardia, history of provoked DVT, PVCs on previous 48-hour monitor, mild carotid artery disease and prostate cancer.  Previous cardiac catheterization performed in the setting of an STEMI on 09/21/2017 showed patent LAD stent with 50% mid LAD lesion, 60% mid left circumflex lesion, and 60% PDA lesion.  PDA lesion is unchanged from 2012.  Left circumflex lesion was significant by FFR and he underwent PCI with DES.  Later that night he had recurrent chest pain and was taken back to the Cath Lab.  Cardiac catheterization revealed patent left circumflex stent with dissection into the OM 3.  Medical therapy was planned as any reattempt with crossing with a wire could lead to worsening dissection.  Due to persistent symptoms, he underwent repeat cardiac cath on 01/11/2018, that showed widely patent stents, the wire dissection had healed completely, EF was normal at the time.  He underwent repeat cardiac cath on 08/03/2018 due to exertional chest discomfort.  This showed new restenosis in the left circumflex at the proximal stent margin, there was also progression of disease in the PDA.  Both sites were treated with DES.  He was enrolled in [place] 4 trial.  Based on the last note in February 2020, he did not wish to continue in [place] 4 trial due to myalgia after injection.  In 2020 he had recurrent chest pain.  He underwent repeat cardiac cath on 06/05/19 showing a new stenosis in the LCx just distal to prior stent. This was treated with repeat DES. Following cath he had persistent chests pain and was actually  readmitted. Ecg was nonacute. Troponin was not c/w ACS. He did undergo repeat cath showing patent stents. There was moderate mid LAD disease that was chronic and unchanged from prior studies. He did have an abnormal Flow wire assessment so this was stented as well. DC home on 06/08/19. Amlodipine  dose increased for BP.  We later increased losartan  to 50 mg daily for BP. He was seen by Pharm D for lipid therapy. Multiple options reviewed. He wanted to rechallenge with low dose Crestor  with possible addition of Zetia  if not at goal.   He is intolerant to Zetia  and all but lowest doses of statins.   On follow up today he still has lack of taste related to Covid. Notes some intermittent vertigo. No chest pain. Notes poor urination and dribbling- going to see urology. No real chest pain.     Past Medical History:  Diagnosis Date   Abnormality of aortic valve    a. possibly bicuspid.   Asthma    Childhood   Carotid artery disease    a. 1-39% bilaterally in 2017.   Coronary artery disease    a. anterior STEMI 04/2011, s/p DES to LAD.   DVT (deep venous thrombosis) (HCC)    a. tx with Xarelto  x 5 months, stopped due to hematuria.   Frequent PVCs    a. seen on Holter monitor.   Hyperlipidemia, mixed    Hypertension    Myocardial infarction (HCC) 05/21/2011   anterior wall, treated  by Dr. Medford Nimrod with 3.0x57mm Promus drug eluting stent to proximal LAD    Peripheral arterial disease    moderate left ICA disease, mild right ICA disease   Prostate cancer (HCC) 2007   Sinus bradycardia    a. HR 50s at home.   Statin intolerance    Unstable angina (HCC) 08/03/2018    Past Surgical History:  Procedure Laterality Date   CARDIAC CATHETERIZATION  05/23/2011, 05/21/2011   05/21/2011   3.0 x 24mm Promus Element drug eluting stent positioned in proximal LAD, stenosis taken from 99% to 0%.   CORONARY PRESSURE/FFR STUDY N/A 09/21/2017   Procedure: INTRAVASCULAR PRESSURE WIRE/FFR STUDY;  Surgeon:  Court Dorn PARAS, MD;  Location: [facility] IN[facility]AS[facility] LAB;  Service: Cardiovascular;  Laterality: N/A;   CORONARY PRESSURE/FFR STUDY N/A 06/07/2019   Procedure: INTRAVASCULAR PRESSURE WIRE/FFR STUDY;  Surgeon: Darron Deatrice LABOR, MD;  Location: [facility];  Service: Cardiovascular;  Laterality: N/A;   CORONARY STENT INTERVENTION N/A 09/21/2017   Procedure: CORONARY STENT INTERVENTION;  Surgeon: Court Dorn PARAS, MD;  Location: [facility];  Service: Cardiovascular;  Laterality: N/A;   CORONARY STENT INTERVENTION N/A 08/03/2018   Procedure: CORONARY STENT INTERVENTION;  Surgeon: [place], Callie Bunyard M, MD;  Location: [facility] INVASIVE CV LAB;  Service: Cardiovascular;  Laterality: N/A;   CORONARY STENT INTERVENTION N/A 06/05/2019   Procedure: CORONARY STENT INTERVENTION;  Surgeon: [place], Chevie Birkhead M, MD;  Location: [facility] INVASIVE CV LAB;  Service: Cardiovascular;  Laterality: N/A;   CORONARY STENT INTERVENTION N/A 06/07/2019   Procedure: CORONARY STENT INTERVENTION;  Surgeon: Darron Deatrice LABOR, MD;  Location: [facility];  Service: Cardiovascular;  Laterality: N/A;   CORONARY/GRAFT ACUTE MI REVASCULARIZATION N/A 09/21/2017   Procedure: Coronary/Graft Acute MI Revascularization;  Surgeon: [place], Rayne Loiseau M, MD;  Location: [facility] INVASIVE CV LAB;  Service: Cardiovascular;  Laterality: N/A;   HERNIA REPAIR     LEFT HEART CATH AND CORONARY ANGIOGRAPHY N/A 09/21/2017   Procedure: LEFT HEART CATH AND CORONARY ANGIOGRAPHY;  Surgeon: [place], Rodrecus Belsky M, MD;  Location: [facility] INVASIVE CV LAB;  Service: Cardiovascular;  Laterality: N/A;   LEFT HEART CATH AND CORONARY ANGIOGRAPHY N/A 09/21/2017   Procedure: LEFT HEART CATH AND CORONARY ANGIOGRAPHY;  Surgeon: Court Dorn PARAS, MD;  Location: [facility];  Service: Cardiovascular;  Laterality: N/A;   LEFT HEART CATH AND CORONARY ANGIOGRAPHY N/A 01/11/2018   Procedure: LEFT HEART CATH AND CORONARY ANGIOGRAPHY;  Surgeon: [place], Aundrey Elahi M, MD;  Location: [facility] INVASIVE CV LAB;   Service: Cardiovascular;  Laterality: N/A;   LEFT HEART CATH AND CORONARY ANGIOGRAPHY N/A 08/03/2018   Procedure: LEFT HEART CATH AND CORONARY ANGIOGRAPHY;  Surgeon: [place], Suzzette Gasparro M, MD;  Location: [facility] INVASIVE CV LAB;  Service: Cardiovascular;  Laterality: N/A;   LEFT HEART CATH AND CORONARY ANGIOGRAPHY N/A 06/05/2019   Procedure: LEFT HEART CATH AND CORONARY ANGIOGRAPHY;  Surgeon: [place], Lashaunta Sicard M, MD;  Location: [facility] INVASIVE CV LAB;  Service: Cardiovascular;  Laterality: N/A;   LEFT HEART CATH AND CORONARY ANGIOGRAPHY N/A 06/07/2019   Procedure: LEFT HEART CATH AND CORONARY ANGIOGRAPHY;  Surgeon: Darron Deatrice LABOR, MD;  Location: [facility] INVASIVE CV LAB;  Service: Cardiovascular;  Laterality: N/A;   PROSTATECTOMY  2007    Current Medications: Outpatient Medications Prior to Visit  Medication Sig Dispense Refill   amLODipine  (NORVASC ) 5 MG tablet Take 1 tablet (5 mg total) by mouth daily. Please keep scheduled appointment 90 tablet 3   aspirin  81 MG tablet Take  81 mg by mouth daily.     atorvastatin  (LIPITOR) 10 MG tablet TAKE ONE TABLET BY MOUTH ONCE DAILY. 90 tablet 3   clopidogrel  (PLAVIX ) 75 MG tablet TAKE 1 TABLET BY MOUTH ONCE A DAY. 90 tablet 3   Coenzyme Q10 (CO Q-10) 100 MG CAPS Take 100 mg by mouth daily.      losartan  (COZAAR ) 100 MG tablet TAKE 1 TABLET BY MOUTH ONCE DAILY. 90 tablet 2   metoprolol  succinate (TOPROL -XL) 25 MG 24 hr tablet Take 1 tablet (25 mg total) by mouth daily. 90 tablet 3   Multiple Vitamin (MULTIVITAMIN WITH MINERALS) TABS tablet Take 1 tablet by mouth daily.     nitroGLYCERIN  (NITROSTAT ) 0.4 MG SL tablet PLACE 1 UNDER TONGUE EVERY 5 MINUTES UP TO 3 DOSES AS NEEDED FOR CHEST PAIN. 25 tablet 3   No facility-administered medications prior to visit.     Allergies:   Adhesive [tape], Demerol [meperidine], Fentanyl , and Statins   Social History   Socioeconomic History   Marital status: Married    Spouse name: Not on file   Number of children: 0   Years of  education: Not on file   Highest education level: Associate degree: occupational, Scientist, product/process development, or vocational program  Occupational History   Not on file  Tobacco Use   Smoking status: Former    Current packs/day: 0.00    Average packs/day: 1 pack/day for 7.0 years (7.0 ttl pk-yrs)    Types: Cigarettes    Start date: 03/05/1970    Quit date: 09/26/1976    Years since quitting: 47.7   Smokeless tobacco: Never  Vaping Use   Vaping status: Never Used  Substance and Sexual Activity   Alcohol use: No    Alcohol/week: 0.0 standard drinks of alcohol   Drug use: No   Sexual activity: Never  Other Topics Concern   Not on file  Social History Narrative   Not on file   Social Drivers of Health   Financial Resource Strain: Low Risk  (09/04/2023)   Overall Financial Resource Strain (CARDIA)    Difficulty of Paying Living Expenses: Not hard at all  Food Insecurity: No Food Insecurity (09/04/2023)   Hunger Vital Sign    Worried About Running Out of Food in the Last Year: Never true    Ran Out of Food in the Last Year: Never true  Transportation Needs: No Transportation Needs (09/04/2023)   PRAPARE - Administrator, Civil Service (Medical): No    Lack of Transportation (Non-Medical): No  Physical Activity: Sufficiently Active (09/04/2023)   Exercise Vital Sign    Days of Exercise per Week: 5 days    Minutes of Exercise per Session: 40 min  Stress: No Stress Concern Present (09/04/2023)   [employer] of Occupational Health - Occupational Stress Questionnaire    Feeling of Stress : Only a little  Social Connections: Moderately Isolated (09/04/2023)   Social Connection and Isolation Panel    Frequency of Communication with Friends and Family: More than three times a week    Frequency of Social Gatherings with Friends and Family: Once a week    Attends Religious Services: Never    Database administrator or Organizations: No    Attends Engineer, structural: Not on file     Marital Status: Married     Family History:  The patient's family history includes Heart failure in his father; Leukemia in his mother.   ROS:   Please see  the history of present illness.    ROS All other systems reviewed and are negative.   PHYSICAL EXAM:   VS:  BP 128/76 (BP Location: Right Arm, Cuff Size: Normal)   Pulse (!) 59   Resp 16   Ht 5' 6 (1.676 m)   Wt 181 lb (82.1 kg)   SpO2 93%   BMI 29.21 kg/m    GEN: Well nourished, well developed, in no acute distress  HEENT: normal  Neck: no JVD, carotid bruits, or masses Cardiac: RRR; no murmurs, rubs, or gallops,no edema  Respiratory:  clear to auscultation bilaterally, normal work of breathing GI: soft, nontender, nondistended, + BS MS: no deformity or atrophy Skin: warm and dry, no rash Neuro:  Alert and Oriented x 3, Strength and sensation are intact Psych: euthymic mood, full affect  Wt Readings from Last 3 Encounters:  07/01/24 181 lb (82.1 kg)  01/04/24 180 lb 6.4 oz (81.8 kg)  09/05/23 186 lb 12.8 oz (84.7 kg)      Studies/Labs Reviewed:   EKG Interpretation Date/Time:  Monday July 01 2024 08:49:02 EDT Ventricular Rate:  59 PR Interval:  146 QRS Duration:  138 QT Interval:  446 QTC Calculation: 441 R Axis:   -41  Text Interpretation: Sinus bradycardia Left axis deviation Right bundle branch block Minimal voltage criteria for LVH, may be normal variant ( R in aVL ) When compared with ECG of 05-Jun-2023 08:22, Right bundle branch block is now Present Confirmed by [place], Mckennon Zwart [phone]) on 07/01/2024 8:51:28 AM    EKG Interpretation Date/Time:  Monday July 01 2024 08:49:02 EDT Ventricular Rate:  59 PR Interval:  146 QRS Duration:  138 QT Interval:  446 QTC Calculation: 441 R Axis:   -41  Text Interpretation: Sinus bradycardia Left axis deviation Right bundle branch block Minimal voltage criteria for LVH, may be normal variant ( R in aVL ) When compared with ECG of 05-Jun-2023 08:22, Right  bundle branch block is now Present Confirmed by [place], Lennyx Verdell [phone]) on 07/01/2024 8:51:28 AM     Recent Labs: No results found for requested labs within last 365 days.   Lipid Panel    Component Value Date/Time   CHOL 159 06/05/2023 0857   CHOL 152 03/22/2013 0813   TRIG 134 06/05/2023 0857   TRIG 83 03/22/2013 0813   HDL 35 (L) 06/05/2023 0857   HDL 39 (L) 03/22/2013 0813   CHOLHDL 4.5 06/05/2023 0857   CHOLHDL 5.5 12/28/2017 1027   VLDL 43 (H) 12/28/2017 1027   LDLCALC 100 (H) 06/05/2023 0857   LDLCALC 96 03/22/2013 0813    Additional studies/ records that were reviewed today include:   Cath 08/03/2018 Prox LAD lesion is 45% stenosed. Ost RPDA to RPDA lesion is 80% stenosed. A drug-eluting stent was successfully placed using a STENT SYNERGY DES 2.25X12. Post intervention, there is a 0% residual stenosis. Ost LAD to Prox LAD lesion is 35% stenosed. Prox Cx-1 lesion is 95% stenosed. Post intervention, there is a 0% residual stenosis. A drug-eluting stent was successfully placed using a STENT SYNERGY DES 3X12. Non-stenotic Prox Cx-2 lesion was previously treated. Post Atrio lesion is 30% stenosed. LV end diastolic pressure is normal.   1. 2 vessel obstructive CAD    - patent stent in the proximal LAD. Diffuse 45-50% mid LAD    - 95% stenosis in the LCx at the proximal stent margin    - 80% PDA 2. Normal LVEDP 3. Successful PCI of the LCx with DES  x 1 overlapping the prior stent 4. Successful PCI of the PDA with DES x 1.    Plan: Anticipate same day discharge.    Recommend uninterrupted dual antiplatelet therapy with Aspirin  81mg  daily and Clopidogrel  75 mg daily for a minimum of 12 months (ACS - Class I recommendation).  Cath 06/05/19:  CORONARY STENT INTERVENTION  LEFT HEART CATH AND CORONARY ANGIOGRAPHY  Conclusion    Prox LAD lesion is 45% stenosed. Ost LAD to Prox LAD lesion is 40% stenosed. Non-stenotic Prox Cx-2 lesion was previously treated. Previously  placed Prox Cx-1 drug eluting stent is widely patent. Prox RCA lesion is 25% stenosed. Previously placed Ost RPDA to RPDA drug eluting stent is widely patent. RPAV lesion is 30% stenosed. Mid Cx lesion is 90% stenosed. A drug-eluting stent was successfully placed using a STENT RESOLUTE ONYX 2.5X15. Post intervention, there is a 0% residual stenosis. The left ventricular systolic function is normal. LV end diastolic pressure is normal. The left ventricular ejection fraction is 55-65% by visual estimate.   1. Single vessel obstructive CAD. 90% stenosis in the mid LCx distal to prior stents into the OM1 2. Continued stent patency in the LAD, PDA and proximal to mid LCx 3. Normal LV function 4. Normal LVEDP 5. Successful PCI of the mid LCx/OM1 with DES x 1 in overlapping fashion   Plan: DAPT indefinitely. He is a candidate for same day discharge. Needs referral to lipid clinic to address his hypercholesterolemia and intolerance to statins. With aggressive CAD target LDL <50.      Cardiac cath 06/07/19:  CORONARY STENT INTERVENTION  INTRAVASCULAR PRESSURE WIRE/FFR STUDY  LEFT HEART CATH AND CORONARY ANGIOGRAPHY  Conclusion    Ost LAD to Prox LAD lesion is 40% stenosed. Non-stenotic Prox Cx-1 lesion was previously treated. Previously placed Prox Cx-2 drug eluting stent is widely patent. Balloon angioplasty was performed. Previously placed Mid Cx drug eluting stent is widely patent. Balloon angioplasty was performed. Non-stenotic Ost RPDA to RPDA lesion was previously treated. RPAV lesion is 30% stenosed. Prox RCA lesion is 30% stenosed. Prox LAD to Mid LAD lesion is 70% stenosed. Post intervention, there is a 0% residual stenosis. A drug-eluting stent was successfully placed using a STENT RESOLUTE ONYX 3.0X30. The left ventricular systolic function is normal. LV end diastolic pressure is normal. The left ventricular ejection fraction is 55-65% by visual estimate.   1.  Widely  patent left circumflex and RCA stents.  There is a stent in the proximal LAD which is patent with moderate in-stent restenosis in the proximal segment which was not significant by DFR (0.94).  In addition, the LAD has 70% stenosis in the midsegment distal to the previously placed stent.  This was significant by DFR (0.85) and thus this was treated with PCI and drug-eluting stent placement.  The stent overlapped with the previously placed proximal stent. 2.  Normal LV systolic function and left ventricular end-diastolic pressure.   Recommendations: Dual antiplatelet therapy for at least 6 months and preferably long-term given multiple overlapped stents. Recommend referral to the lipid clinic for treatment of hyperlipidemia.       ASSESSMENT:    1. CAD S/P percutaneous coronary angioplasty   2. Essential hypertension   3. Mixed hyperlipidemia         PLAN:  In order of problems listed above:  CAD s/p multiple prior stent procedures. S/p stent of LCx for restenosis in September 2020. He had stenting of the mid LAD for abnormal FFR 2 weeks  later for continued chest pain. Denies any current chest pain. Continue aspirin  and Plavix  indefinitely.   2.   Hypertension: Blood pressure is well controlled.   Continue current doses of amlodipine , and losartan .   3.   Hyperlipidemia: With multiple prior stent procedures and aggressive CAD I would like for him to be on aggressive lipid lowering therapy. Seen in lipid clinic in past intolerant of Crestor , Zetia , lipitor, and Inclisaran ([place] trial injection). He is on lipitor 10 mg 3 x/week. Hasn't wanted to pursue Repatha given prior intolerances. Will update labs today  4. New RBBB. Asymptomatic. Felt to be benign.   5. History of prostate CA. Followed by urology  Follow up in 6 monthsn    Medication Adjustments/Labs and Tests Ordered: Current medicines are reviewed at length with the patient today.  Concerns regarding medicines are  outlined above.  Medication changes, Labs and Tests ordered today are listed in the Patient Instructions below. There are no Patient Instructions on file for this visit.    Signed, Dave Mergen [place], MD  07/01/2024 9:09 AM    [facility] [address] [place], [place], [place]  [address] Phone: [phone]; Fax: [phone]

## 2024-06-25 DIAGNOSIS — L820 Inflamed seborrheic keratosis: Secondary | ICD-10-CM | POA: Diagnosis not present

## 2024-06-25 DIAGNOSIS — D225 Melanocytic nevi of trunk: Secondary | ICD-10-CM | POA: Diagnosis not present

## 2024-06-30 ENCOUNTER — Other Ambulatory Visit: Payer: Self-pay | Admitting: Cardiology

## 2024-07-01 ENCOUNTER — Encounter: Payer: Self-pay | Admitting: Cardiology

## 2024-07-01 ENCOUNTER — Ambulatory Visit: Attending: Cardiology | Admitting: Cardiology

## 2024-07-01 ENCOUNTER — Other Ambulatory Visit: Payer: Self-pay

## 2024-07-01 VITALS — BP 128/76 | HR 59 | Resp 16 | Ht 66.0 in | Wt 181.0 lb

## 2024-07-01 DIAGNOSIS — I2510 Atherosclerotic heart disease of native coronary artery without angina pectoris: Secondary | ICD-10-CM | POA: Diagnosis not present

## 2024-07-01 DIAGNOSIS — I10 Essential (primary) hypertension: Secondary | ICD-10-CM | POA: Diagnosis not present

## 2024-07-01 DIAGNOSIS — E782 Mixed hyperlipidemia: Secondary | ICD-10-CM

## 2024-07-01 DIAGNOSIS — Z9861 Coronary angioplasty status: Secondary | ICD-10-CM

## 2024-07-01 LAB — LIPID PANEL

## 2024-07-01 NOTE — Patient Instructions (Signed)
 Medication Instructions:  Continue same medications *If you need a refill on your cardiac medications before your next appointment, please call your pharmacy*  Lab Work: None ordered  Testing/Procedures: None ordered  Follow-Up: At [facility], you and your health needs are our priority.  As part of our continuing mission to provide you with exceptional heart care, our providers are all part of one team.  This team includes your primary Cardiologist (physician) and Advanced Practice Providers or APPs (Physician Assistants and Nurse Practitioners) who all work together to provide you with the care you need, when you need it.  Your next appointment  6 months   Call in Jan to schedule April appointment     Provider:  Dr.Jordan   We recommend signing up for the patient portal called MyChart.  Sign up information is provided on this After Visit Summary.  MyChart is used to connect with patients for Virtual Visits (Telemedicine).  Patients are able to view lab/test results, encounter notes, upcoming appointments, etc.  Non-urgent messages can be sent to your provider as well.   To learn more about what you can do with MyChart, go to [email or web address].

## 2024-07-02 ENCOUNTER — Ambulatory Visit: Payer: Self-pay | Admitting: Cardiology

## 2024-07-02 LAB — CBC WITH DIFFERENTIAL/PLATELET
Basophils Absolute: 0 10*3/uL (ref 0.0–0.2)
Basos: 0 %
EOS (ABSOLUTE): 0.2 10*3/uL (ref 0.0–0.4)
Eos: 3 %
Hematocrit: 46.6 % (ref 37.5–51.0)
Hemoglobin: 15.3 g/dL (ref 13.0–17.7)
Immature Grans (Abs): 0 10*3/uL (ref 0.0–0.1)
Immature Granulocytes: 0 %
Lymphocytes Absolute: 1.3 10*3/uL (ref 0.7–3.1)
Lymphs: 20 %
MCH: 31.5 pg (ref 26.6–33.0)
MCHC: 32.8 g/dL (ref 31.5–35.7)
MCV: 96 fL (ref 79–97)
Monocytes Absolute: 0.8 10*3/uL (ref 0.1–0.9)
Monocytes: 11 %
Neutrophils Absolute: 4.4 10*3/uL (ref 1.4–7.0)
Neutrophils: 66 %
Platelets: 254 10*3/uL (ref 150–450)
RBC: 4.86 x10E6/uL (ref 4.14–5.80)
RDW: 12.3 % (ref 11.6–15.4)
WBC: 6.8 10*3/uL (ref 3.4–10.8)

## 2024-07-02 LAB — COMPREHENSIVE METABOLIC PANEL WITH GFR
ALT: 21 IU/L (ref 0–44)
AST: 25 IU/L (ref 0–40)
Albumin: 4.5 g/dL (ref 3.8–4.8)
Alkaline Phosphatase: 98 IU/L (ref 47–123)
BUN/Creatinine Ratio: 21 (ref 10–24)
BUN: 21 mg/dL (ref 8–27)
Bilirubin Total: 0.5 mg/dL (ref 0.0–1.2)
CO2: 22 mmol/L (ref 20–29)
Calcium: 9.2 mg/dL (ref 8.6–10.2)
Chloride: 103 mmol/L (ref 96–106)
Creatinine, Ser: 1 mg/dL (ref 0.76–1.27)
Globulin, Total: 1.7 g/dL (ref 1.5–4.5)
Glucose: 81 mg/dL (ref 70–99)
Potassium: 4.5 mmol/L (ref 3.5–5.2)
Sodium: 140 mmol/L (ref 134–144)
Total Protein: 6.2 g/dL (ref 6.0–8.5)
eGFR: 79 mL/min/1.73 (ref >59)

## 2024-07-02 LAB — LIPID PANEL
Chol/HDL Ratio: 3.8 ratio (ref 0.0–5.0)
Cholesterol, Total: 152 mg/dL (ref 100–199)
HDL: 40 mg/dL
LDL Chol Calc (NIH): 93 mg/dL (ref 0–99)
Triglycerides: 101 mg/dL (ref 0–149)
VLDL Cholesterol Cal: 19 mg/dL (ref 5–40)

## 2024-07-02 LAB — PSA: Prostate Specific Ag, Serum: <0.1 ng/mL (ref 0.0–4.0)

## 2024-07-05 ENCOUNTER — Other Ambulatory Visit: Payer: Self-pay | Admitting: Cardiology

## 2024-07-25 ENCOUNTER — Other Ambulatory Visit: Payer: Self-pay | Admitting: Physician Assistant

## 2024-09-02 ENCOUNTER — Encounter

## 2024-09-06 ENCOUNTER — Encounter: Payer: PPO | Admitting: Internal Medicine

## 2024-09-17 ENCOUNTER — Ambulatory Visit

## 2024-09-17 VITALS — BP 155/81 | HR 65 | Ht 67.0 in | Wt 184.0 lb

## 2024-09-17 DIAGNOSIS — G8929 Other chronic pain: Secondary | ICD-10-CM

## 2024-09-17 DIAGNOSIS — K439 Ventral hernia without obstruction or gangrene: Secondary | ICD-10-CM | POA: Diagnosis not present

## 2024-09-17 DIAGNOSIS — M79644 Pain in right finger(s): Secondary | ICD-10-CM

## 2024-09-17 DIAGNOSIS — R10A1 Flank pain, right side: Secondary | ICD-10-CM

## 2024-09-17 NOTE — Patient Instructions (Signed)
 Ventral hernia- not concerning at this time.  Continue to monitor and avoid abdominal strain, such as sit-ups or heavy lifting (bathtubs).

## 2024-09-17 NOTE — Progress Notes (Signed)
 "  Established Patient Office Visit  Subjective   Patient ID: [name], male    DOB: 1951/04/22  Age: 73 y.o. MRN: [id number]  No chief complaint on file.   HPI Discussed the use of AI scribe software for clinical note transcription with the patient, who gave verbal consent to proceed.  History of Present Illness    [name] is a 74 year old male who presents with concerns about a finger injury, a possible hernia, and chronic back pain.  Right middle finger injury - Injury occurred in late June while polishing a piece - Significant swelling developed after injury - Limited range of motion and inability to fully extend the finger - Discomfort with twisting motions and when opening bottle caps - Sensation of something moving within the joint during articulation - Suspects possible fracture  Abdominal wall bulge (possible hernia) - Bulge present from sternum to prior prostate surgery incision - Onset in late January after increasing sit-ups from 30 to 50 per day - Bulge appears during sit-ups and can be manually reduced - History of left-sided hernia repair without mesh after lifting a heavy object  Chronic back pain - Persistent pain not consistent with muscle strain - Pain located on the lateral aspects of the back, not near the spine - Pain can radiate through the back when severe - Temporary relief with use of a heat pad - No history of kidney stones - No abnormal urine discoloration beyond baseline  Exertional palpitations - History of right bundle branch block - Palpitations occur during exertion, described as heart pounding hard enough to be heard in the ears - Palpitations prompt cessation of activity to avoid syncope - Followed by a cardiologist every six months     Patient Active Problem List   Diagnosis Date Noted   Pain in finger of right hand 09/19/2024   Right flank pain, chronic 09/19/2024   Ventral hernia without obstruction or gangrene  09/19/2024   COVID-19 11/28/2023   History of prostate cancer 09/05/2023   Need for influenza vaccination 09/05/2023   NSTEMI (non-ST elevated myocardial infarction) (HCC) 09/20/2017   Rupture of plantaris tendon, right, subsequent encounter 08/15/2016   Hematoma of leg, right, subsequent encounter 08/15/2016   Right calf pain 08/11/2016   History of DVT (deep vein thrombosis) 08/11/2016   Angina pectoris 10/08/2014   Palpitations 10/08/2014   Bilateral carotid bruits 10/08/2014   CAD S/P percutaneous coronary angioplasty 03/20/2013   HTN (hypertension) 03/20/2013   Mixed hyperlipidemia 03/20/2013    ROS    Objective:     BP (!) 155/81   Pulse 65   Ht 5' 7 (1.702 m)   Wt 184 lb (83.5 kg)   SpO2 97%   BMI 28.82 kg/m  BP Readings from Last 3 Encounters:  09/17/24 (!) 155/81  07/01/24 128/76  01/04/24 (!) 150/84   Wt Readings from Last 3 Encounters:  09/17/24 184 lb (83.5 kg)  07/01/24 181 lb (82.1 kg)  01/04/24 180 lb 6.4 oz (81.8 kg)     Physical Exam Vitals and nursing note reviewed.  Constitutional:      Appearance: Normal appearance.  HENT:     Head: Normocephalic.     Right Ear: Tympanic membrane, ear canal and external ear normal.     Left Ear: Tympanic membrane, ear canal and external ear normal.     Nose: Nose normal.     Mouth/Throat:     Mouth: Mucous membranes are moist.  Pharynx: Oropharynx is clear.  Cardiovascular:     Rate and Rhythm: Normal rate and regular rhythm.  Pulmonary:     Effort: Pulmonary effort is normal.     Breath sounds: Normal breath sounds.  Musculoskeletal:     Cervical back: Normal range of motion and neck supple.  Skin:    General: Skin is warm and dry.  Neurological:     Mental Status: He is alert and oriented to person, place, and time.  Psychiatric:        Mood and Affect: Mood normal.        Thought Content: Thought content normal.    No results found for any visits on 09/17/24.    The ASCVD Risk score  (Arnett DK, et al., 2019) failed to calculate for the following reasons:   Risk score cannot be calculated because patient has a medical history suggesting prior/existing ASCVD   * - Cholesterol units were assumed    Assessment & Plan:   Problem List Items Addressed This Visit       Other   Pain in finger of right hand - Primary   Persistent pain and dysfunction in right middle finger since June, post-injury. Limited movement, inability to straighten finger. Possible hyperextension or fracture. - Referred to hand specialist at [facility] in [facility] for further evaluation and management.      Relevant Orders   Ambulatory referral to Orthopedic Surgery   Right flank pain, chronic   Intermittent pain, not associated with tenderness or urine discoloration. Radiating pain, likely muscular or non-spinal. No kidney stone history. Discussed potential kidney involvement. - Ordered urinalysis to check for blood in urine. - Ordered ultrasound of kidneys to rule out renal involvement.      Relevant Orders   US  Renal   Ventral hernia without obstruction or gangrene   Mild, reducible hernia from sternum to previous surgical incision. No strangulation or severe complications. Discussed surgical repair risks, including mesh complications. - Advised against sit-ups and heavy lifting to prevent exacerbation. - Recommended alternative exercises such as planks or kettlebell exercises to strengthen the core.         Return in about 1 year (around 09/17/2025) for as needed.    Leita Longs, FNP  "

## 2024-09-19 DIAGNOSIS — M79644 Pain in right finger(s): Secondary | ICD-10-CM | POA: Insufficient documentation

## 2024-09-19 DIAGNOSIS — K439 Ventral hernia without obstruction or gangrene: Secondary | ICD-10-CM | POA: Insufficient documentation

## 2024-09-19 DIAGNOSIS — G8929 Other chronic pain: Secondary | ICD-10-CM | POA: Insufficient documentation

## 2024-09-19 NOTE — Assessment & Plan Note (Signed)
 Intermittent pain, not associated with tenderness or urine discoloration. Radiating pain, likely muscular or non-spinal. No kidney stone history. Discussed potential kidney involvement. - Ordered urinalysis to check for blood in urine. - Ordered ultrasound of kidneys to rule out renal involvement.

## 2024-09-19 NOTE — Assessment & Plan Note (Signed)
 Mild, reducible hernia from sternum to previous surgical incision. No strangulation or severe complications. Discussed surgical repair risks, including mesh complications. - Advised against sit-ups and heavy lifting to prevent exacerbation. - Recommended alternative exercises such as planks or kettlebell exercises to strengthen the core.

## 2024-09-19 NOTE — Assessment & Plan Note (Signed)
 Persistent pain and dysfunction in right middle finger since June, post-injury. Limited movement, inability to straighten finger. Possible hyperextension or fracture. - Referred to hand specialist at [facility] in [facility] for further evaluation and management.

## 2024-09-24 ENCOUNTER — Ambulatory Visit (HOSPITAL_COMMUNITY)
Admission: RE | Admit: 2024-09-24 | Discharge: 2024-09-24 | Disposition: A | Discharge status: Home / Self Care | Source: Ambulatory Visit

## 2024-09-24 DIAGNOSIS — G8929 Other chronic pain: Secondary | ICD-10-CM | POA: Diagnosis present

## 2024-09-24 DIAGNOSIS — R10A1 Flank pain, right side: Secondary | ICD-10-CM | POA: Insufficient documentation

## 2024-10-12 ENCOUNTER — Ambulatory Visit: Payer: Self-pay

## 2024-11-01 ENCOUNTER — Other Ambulatory Visit: Payer: Self-pay | Admitting: Cardiology

## 2024-12-25 ENCOUNTER — Ambulatory Visit: Admitting: Cardiology
